# Patient Record
Sex: Female | Born: 2000 | Race: White | Hispanic: No | Smoking: Never smoker
Health system: Southern US, Community
[De-identification: ages and names within clinical notes are randomized; demographics above are authoritative.]

## PROBLEM LIST (undated history)

## (undated) DIAGNOSIS — R519 Headache, unspecified: Secondary | ICD-10-CM

## (undated) DIAGNOSIS — G5700 Lesion of sciatic nerve, unspecified lower limb: Secondary | ICD-10-CM

## (undated) DIAGNOSIS — G4730 Sleep apnea, unspecified: Secondary | ICD-10-CM

## (undated) DIAGNOSIS — J45909 Unspecified asthma, uncomplicated: Secondary | ICD-10-CM

## (undated) DIAGNOSIS — F419 Anxiety disorder, unspecified: Secondary | ICD-10-CM

## (undated) DIAGNOSIS — G90A Postural orthostatic tachycardia syndrome (POTS): Secondary | ICD-10-CM

## (undated) DIAGNOSIS — K219 Gastro-esophageal reflux disease without esophagitis: Secondary | ICD-10-CM

## (undated) DIAGNOSIS — Q6689 Other  specified congenital deformities of feet: Secondary | ICD-10-CM

## (undated) DIAGNOSIS — E079 Disorder of thyroid, unspecified: Secondary | ICD-10-CM

## (undated) DIAGNOSIS — G4700 Insomnia, unspecified: Secondary | ICD-10-CM

## (undated) DIAGNOSIS — E282 Polycystic ovarian syndrome: Secondary | ICD-10-CM

## (undated) DIAGNOSIS — F4310 Post-traumatic stress disorder, unspecified: Secondary | ICD-10-CM

## (undated) DIAGNOSIS — I10 Essential (primary) hypertension: Secondary | ICD-10-CM

## (undated) DIAGNOSIS — N946 Dysmenorrhea, unspecified: Secondary | ICD-10-CM

## (undated) DIAGNOSIS — N643 Galactorrhea not associated with childbirth: Secondary | ICD-10-CM

## (undated) DIAGNOSIS — G478 Other sleep disorders: Secondary | ICD-10-CM

## (undated) DIAGNOSIS — K449 Diaphragmatic hernia without obstruction or gangrene: Secondary | ICD-10-CM

## (undated) DIAGNOSIS — L309 Dermatitis, unspecified: Secondary | ICD-10-CM

## (undated) DIAGNOSIS — F909 Attention-deficit hyperactivity disorder, unspecified type: Secondary | ICD-10-CM

## (undated) DIAGNOSIS — F329 Major depressive disorder, single episode, unspecified: Secondary | ICD-10-CM

## (undated) HISTORY — DX: Disorder of thyroid, unspecified: E07.9

## (undated) HISTORY — DX: Galactorrhea not associated with childbirth: N64.3

## (undated) HISTORY — DX: Diaphragmatic hernia without obstruction or gangrene: K44.9

## (undated) HISTORY — DX: Other specified congenital deformities of feet: Q66.89

## (undated) HISTORY — DX: Lesion of sciatic nerve, unspecified lower limb: G57.00

## (undated) HISTORY — DX: Other sleep disorders: G47.8

## (undated) HISTORY — PX: CARDIAC SURGERY: SHX584

## (undated) HISTORY — DX: Dysmenorrhea, unspecified: N94.6

## (undated) HISTORY — DX: Dermatitis, unspecified: L30.9

## (undated) HISTORY — DX: Gastro-esophageal reflux disease without esophagitis: K21.9

## (undated) HISTORY — DX: Sleep apnea, unspecified: G47.30

## (undated) HISTORY — DX: Unspecified asthma, uncomplicated: J45.909

## (undated) HISTORY — DX: Essential (primary) hypertension: I10

## (undated) HISTORY — DX: Postural orthostatic tachycardia syndrome (POTS): G90.A

## (undated) HISTORY — DX: Polycystic ovarian syndrome: E28.2

## (undated) HISTORY — PX: TONSILLECTOMY: SUR1361

## (undated) HISTORY — PX: ADENOIDECTOMY: SUR15

## (undated) HISTORY — DX: Insomnia, unspecified: G47.00

## (undated) HISTORY — DX: Headache, unspecified: R51.9

---
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
## 2022-01-16 ENCOUNTER — Other Ambulatory Visit: Payer: Self-pay | Admitting: Family Medicine

## 2022-01-16 ENCOUNTER — Encounter: Payer: Self-pay | Admitting: Allergy & Immunology

## 2022-01-20 ENCOUNTER — Other Ambulatory Visit: Payer: Self-pay | Admitting: *Deleted

## 2022-01-20 MED ORDER — PREDNISONE 10 MG PO TABS: ORAL_TABLET | ORAL | 0 refills | Status: DC

## 2022-01-20 MED ORDER — BUDESONIDE 0.5 MG/2ML IN SUSP: 0.5000 mg | Freq: Two times a day (BID) | RESPIRATORY_TRACT | 1 refills | Status: DC | PRN

## 2022-01-24 ENCOUNTER — Encounter: Payer: Self-pay | Admitting: Allergy & Immunology

## 2022-01-25 ENCOUNTER — Encounter: Payer: Self-pay | Admitting: Allergy & Immunology

## 2022-01-25 NOTE — Telephone Encounter (Signed)
We can order it tomorrow and have you sign so that we can mail it to her home.

## 2022-01-26 MED ORDER — PNEUMOVAX 23 25 MCG/0.5ML IJ INJ: 0.5000 mL | INJECTION | INTRAMUSCULAR | 0 refills | Status: AC

## 2022-01-26 NOTE — Telephone Encounter (Signed)
I just wrote it!  Malachi Bonds, MD [facility]

## 2022-02-01 ENCOUNTER — Other Ambulatory Visit: Payer: Self-pay

## 2022-02-01 ENCOUNTER — Ambulatory Visit (INDEPENDENT_AMBULATORY_CARE_PROVIDER_SITE_OTHER): Payer: Medicaid Other | Admitting: Allergy & Immunology

## 2022-02-01 VITALS — BP 120/78 | HR 101 | Temp 97.3°F | Ht 59.0 in | Wt 232.4 lb

## 2022-02-01 DIAGNOSIS — J4550 Severe persistent asthma, uncomplicated: Secondary | ICD-10-CM

## 2022-02-01 DIAGNOSIS — J3089 Other allergic rhinitis: Secondary | ICD-10-CM | POA: Diagnosis not present

## 2022-02-01 DIAGNOSIS — J302 Other seasonal allergic rhinitis: Secondary | ICD-10-CM

## 2022-02-01 DIAGNOSIS — B999 Unspecified infectious disease: Secondary | ICD-10-CM

## 2022-02-01 DIAGNOSIS — T7800XD Anaphylactic reaction due to unspecified food, subsequent encounter: Secondary | ICD-10-CM

## 2022-02-01 DIAGNOSIS — L2084 Intrinsic (allergic) eczema: Secondary | ICD-10-CM

## 2022-02-01 NOTE — Progress Notes (Signed)
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 [name]  Plan/Recommendations:    1. Intrinsic atopic dermatitis  - Continue with the triamcinolone ointment as needed.  - Continue with moisturizing twice daily.   2. Moderate persistent asthma, uncomplicated - Lung testing looked AMAZING today! GREAT WORK!  - Daily controller medication(s): Symbicort 160/4.5 two puffs twice daily with spacer + Singulair (montelukast) 10mg  daily.  - Rescue medications: ProAir 4 puffs every 4-6 hours as needed - Changes during respiratory infections or worsening symptoms: add Flovent to 2 puffs twice daily for TWO WEEKS. - Asthma control goals:  * Full participation in all desired activities (may need albuterol before activity) * Albuterol use two time or less a week on average (not counting use with activity) * Cough interfering with sleep two time or less a month * Oral steroids no more than once a year * No hospitalizations  3. Chronic allergic rhinitis (indoor molds, outdoor molds, dust mites and cat) - Your nose looks great today!  - Continue with: Singulair (montelukast) 10mg  daily and Dymista (fluticasone/azelastine) two sprays per nostril 1-2 times daily as needed  - Consider nasal saline rinses 1-2 times daily to remove allergens from the nasal cavities as well as help with mucous clearance (this is especially helpful to do before the nasal sprays are given)  4. 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   5. Return in about 6 months (around 08/04/2022).    Subjective:   Molly [name] is a 21 y.o. 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   Insomnia 06/10/2020   Sleep paralysis 06/10/2020   Dysmenorrhea 03/23/2020   Menorrhagia with irregular cycle 03/23/2020   Fatigue 03/23/2020   ADHD (attention deficit hyperactivity disorder) 03/09/2020   Unexplained dysphagia 01/21/2020   Anxiety 10/23/2019   Eustachian tube dysfunction, left 10/23/2019   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 04/01/2019   Obesity without serious comorbidity with body mass index (BMI) in 95th to 98th percentile for age in pediatric patient 09/04/2018   MDD (major depressive disorder), recurrent, severe, 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   Suicidal ideation 03/07/2018   PTSD (post-traumatic stress disorder) 03/07/2018   MDD (major depressive disorder), severe (HCC) 03/06/2018   Keratosis pilaris 01/15/2018   Severe persistent asthma, uncomplicated 01/15/2018   Intrinsic atopic dermatitis 01/15/2018   Seasonal and perennial allergic rhinitis 01/15/2018   Anaphylactic shock due to adverse food reaction 01/15/2018   Tic disorder 2019  Scoliosis 2019   Adverse food reaction 07/11/2016   Chronic headaches 05/07/2012   Chronic migraine w/o aura w/o status migrainosus, not intractable 05/07/2012   Hyperlipidemia 2013   History of atrial septal defect repair 12/04/2011   Idiopathic urticaria 09/05/2011   Chronic  constipation 09/05/2011   Hypertension 2011    History obtained from: chart review and patient and mother.  Molly [name] is a 21 y.o. female presenting for a follow up visit. She was last seen in October 2022.  At that time, her asthma was controlled with Symbicort 160 mcg 2 puffs twice daily as well as Singulair and albuterol.  She has Flovent that she has during respiratory flares.  For her rhinitis, we continue with Allegra as well as Singulair and Dymista.  She continues to avoid fish as well as blueberries.  Atopic dermatitis was controlled with triamcinolone and moisturizing.  We did decide to do an immune work-up again.  She was having a lot of breakthrough infections.  Her IgG continued to decline at 467.  However, it has been down to 436 as recently as 2018. She is still working on getting her Pneumovax.   Since the last visit, she has largely done well.    Asthma/Respiratory Symptom History: She reports that she feels that "something is weighing down" on her chest and her ribs were hurting. She started Flovent and she had been using the albuterol every 4 hours. Mom knows that sometimes her anxiety plays a part in this and she thinks that this might have something to do with it.  She has not had any other illnesses aside from the COVID in November and then the illness at the end of last month. She is still waiting to get the Pneumovax. [facility]   Allergic Rhinitis Symptom History: She has had some sneezing.  She remains on the Allegra daily as well as the montelukast. She has not needed antibiotics in quite some time. Overall symptoms are under excellent control.   Food Allergy Symptom History: She continues to avoid fish and blueberries. She has not been interested in food challenges in the past. Her EpiPen is up to date.   She graduates in June 2023. 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 It seems that she has everything planned out.   Otherwise, there have been no changes to her past medical history, surgical history, family history, or social history.    Review of Systems  Constitutional: Negative.  Negative for chills, fever, malaise/fatigue and weight loss.  HENT:  Positive for congestion. Negative for ear discharge, ear pain and sinus pain.   Eyes:  Negative for pain, discharge and redness.  Respiratory:  Negative for cough, sputum production, shortness of breath and wheezing.   Cardiovascular: Negative.  Negative for chest pain and palpitations.  Gastrointestinal:  Negative for abdominal pain, constipation, diarrhea, heartburn, nausea and vomiting.  Skin: Negative.  Negative for itching and rash.  Neurological:  Negative for dizziness and headaches.  Endo/Heme/Allergies:  Positive for environmental allergies. Does not bruise/bleed easily.      Objective:   Blood pressure 120/78, pulse (!) 101, temperature (!) 97.3 F (36.3 C), height 4\' 11"  (1.499 m), weight 232 lb 6.4 oz (105.4 kg), SpO2 99 %. Body mass index is 46.94 kg/m.    Physical Exam Vitals reviewed.  Constitutional:  Appearance: She is well-developed.     Comments: Overall, she looks very good today. She is smiling and interactive.   HENT:     Head: Normocephalic and atraumatic.     Right Ear: Tympanic membrane, ear canal and external ear normal.     Left Ear: Tympanic membrane, ear canal and external ear normal.     Nose: Mucosal edema and rhinorrhea present. No nasal deformity or septal deviation.     Right Turbinates: Enlarged, swollen and pale.     Left Turbinates: Enlarged, swollen and pale.     Right Sinus: No maxillary sinus tenderness or frontal sinus tenderness.     Left Sinus: No maxillary sinus tenderness or frontal sinus tenderness.     Mouth/Throat:     Mouth: Mucous membranes are not pale and not dry.     Pharynx: Uvula midline.  Eyes:      General: Lids are normal. No allergic shiner.       Right eye: No discharge.        Left eye: No discharge.     Conjunctiva/sclera: Conjunctivae normal.     Right eye: Right conjunctiva is not injected. No chemosis.    Left eye: Left conjunctiva is not injected. No chemosis.    Pupils: Pupils are equal, round, and reactive to light.  Cardiovascular:     Rate and Rhythm: Normal rate and regular rhythm.     Heart sounds: Normal heart sounds.  Pulmonary:     Effort: Pulmonary effort is normal. No tachypnea, accessory muscle usage or respiratory distress.     Breath sounds: Normal breath sounds. No wheezing, rhonchi or rales.     Comments: Moving air well in all lung fields.  No increased work of breathing. Chest:     Chest wall: No tenderness.  Lymphadenopathy:     Cervical: No cervical adenopathy.  Skin:    General: Skin is warm.     Capillary Refill: Capillary refill takes less than 2 seconds.     Coloration: Skin is not pale.     Findings: No abrasion, erythema, petechiae or rash. Rash is not papular, urticarial or vesicular.     Comments: No eczematous or urticarial lesions noted.   Neurological:     Mental Status: She is alert.  Psychiatric:        Behavior: Behavior is cooperative.     Diagnostic studies:    Spirometry: results normal (FEV1: 2.54/91%, FVC: 3.07/98%, FEV1/FVC: 83%).    Spirometry consistent with normal pattern.   Allergy Studies: none         Malachi Bonds, MD  [facility]

## 2022-02-01 NOTE — Patient Instructions (Addendum)
1. Intrinsic atopic dermatitis  ?- Continue with the triamcinolone ointment as needed.  ?- Continue with moisturizing twice daily.  ? ?2. Moderate persistent asthma, uncomplicated ?- Lung testing looked AMAZING today! GREAT WORK!  ?- Daily controller medication(s): Symbicort 160/4.5 two puffs twice daily with spacer + Singulair (montelukast) 10mg  daily.  ?- Rescue medications: ProAir 4 puffs every 4-6 hours as needed ?- Changes during respiratory infections or worsening symptoms: add Flovent to 2 puffs twice daily for TWO WEEKS. ?- Asthma control goals:  ?* Full participation in all desired activities (may need albuterol before activity) ?* Albuterol use two time or less a week on average (not counting use with activity) ?* Cough interfering with sleep two time or less a month ?* Oral steroids no more than once a year ?* No hospitalizations ? ?3. Chronic allergic rhinitis (indoor molds, outdoor molds, dust mites and cat) ?- Your nose looks great today!  ?- Continue with: Singulair (montelukast) 10mg  daily and Dymista (fluticasone/azelastine) two sprays per nostril 1-2 times daily as needed  ?- Consider nasal saline rinses 1-2 times daily to remove allergens from the nasal cavities as well as help with mucous clearance (this is especially helpful to do before the nasal sprays are given) ? ?4. 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  ? ?5. Return in about 6 months (around 08/04/2022).  ? ? ? ?Please inform of any Emergency Department visits, hospitalizations, or changes in symptoms. Call 10/04/2022 before going to the ED for breathing or allergy symptoms since we might be able to fit you in for a sick visit. Feel free to contact us anytime with any questions, problems, or concerns. ? ?It was a pleasure to see you again today! ? ?Websites that have reliable patient information: ?1. American Academy of Asthma, Allergy, and Immunology: www.aaaai.org ?2. Food Allergy Research and Education  (FARE): foodallergy.org ?3. Mothers of Asthmatics: http://www.asthmacommunitynetwork.org ?4. [place] of Allergy, Asthma, and Immunology: [place] ? ? ?COVID-19 Vaccine Information can be found at: [employer] For questions related to vaccine distribution or appointments, please email [email or web address] or call [phone].  ? ?We realize that you might be concerned about having an allergic reaction to the COVID19 vaccines. To help with that concern, WE ARE OFFERING THE COVID19 VACCINES IN OUR OFFICE! Ask the front desk for dates!  ? ? ? ??Like? [email or web address] on Facebook and Instagram for our latest updates!  ?  ? ? ?A healthy democracy works best when [phone] participate! Make sure you are registered to vote! If you have moved or changed any of your contact information, you will need to get this updated before voting! ? ?In some cases, you MAY be able to register to vote online: [place] ? ? ? ? ?

## 2022-02-03 MED ORDER — VENTOLIN HFA 108 (90 BASE) MCG/ACT IN AERS: 2.0000 | INHALATION_SPRAY | RESPIRATORY_TRACT | 1 refills | Status: DC | PRN

## 2022-02-03 MED ORDER — EPINEPHRINE 0.3 MG/0.3ML IJ SOAJ: 0.3000 mg | INTRAMUSCULAR | 2 refills | Status: DC | PRN

## 2022-02-03 MED ORDER — AZELASTINE-FLUTICASONE 137-50 MCG/ACT NA SUSP: 1.0000 | Freq: Two times a day (BID) | NASAL | 2 refills | Status: DC

## 2022-02-03 MED ORDER — FLUTICASONE PROPIONATE HFA 220 MCG/ACT IN AERO: INHALATION_SPRAY | RESPIRATORY_TRACT | 0 refills | Status: DC

## 2022-02-03 MED ORDER — MONTELUKAST SODIUM 10 MG PO TABS: 10.0000 mg | ORAL_TABLET | Freq: Every day | ORAL | 5 refills | Status: DC

## 2022-02-03 MED ORDER — BUDESONIDE-FORMOTEROL FUMARATE 160-4.5 MCG/ACT IN AERO: INHALATION_SPRAY | RESPIRATORY_TRACT | 5 refills | Status: DC

## 2022-02-05 ENCOUNTER — Encounter: Payer: Self-pay | Admitting: Allergy & Immunology

## 2022-02-06 ENCOUNTER — Other Ambulatory Visit: Payer: Self-pay

## 2022-02-06 ENCOUNTER — Encounter: Payer: Self-pay | Admitting: Pediatrics

## 2022-02-06 ENCOUNTER — Ambulatory Visit (INDEPENDENT_AMBULATORY_CARE_PROVIDER_SITE_OTHER): Payer: Medicaid Other | Admitting: Pediatrics

## 2022-02-06 VITALS — BP 124/86 | HR 128 | Ht 59.21 in | Wt 229.5 lb

## 2022-02-06 DIAGNOSIS — Z1389 Encounter for screening for other disorder: Secondary | ICD-10-CM | POA: Diagnosis not present

## 2022-02-06 DIAGNOSIS — Z1331 Encounter for screening for depression: Secondary | ICD-10-CM | POA: Diagnosis not present

## 2022-02-06 DIAGNOSIS — E559 Vitamin D deficiency, unspecified: Secondary | ICD-10-CM

## 2022-02-06 DIAGNOSIS — Z1330 Encounter for screening examination for mental health and behavioral disorders, unspecified: Secondary | ICD-10-CM | POA: Diagnosis not present

## 2022-02-06 DIAGNOSIS — E669 Obesity, unspecified: Secondary | ICD-10-CM | POA: Diagnosis not present

## 2022-02-06 DIAGNOSIS — G90A Postural orthostatic tachycardia syndrome (POTS): Secondary | ICD-10-CM

## 2022-02-06 DIAGNOSIS — Z0000 Encounter for general adult medical examination without abnormal findings: Secondary | ICD-10-CM | POA: Diagnosis not present

## 2022-02-06 DIAGNOSIS — F419 Anxiety disorder, unspecified: Secondary | ICD-10-CM | POA: Diagnosis not present

## 2022-02-06 DIAGNOSIS — Z68.41 Body mass index (BMI) pediatric, greater than or equal to 95th percentile for age: Secondary | ICD-10-CM

## 2022-02-06 NOTE — Progress Notes (Signed)
Patient Name:  Molly GobbleKatherine M [name] Date of Birth:  04-Jul-2001 Age:  21 y.o. Date of Visit:  02/06/2022   Chief Complaint  Patient presents with   Well Child  Molly AddisonKatie is the main historian, however her mom Molly BumpsJessica also contributed to the history.   SUBJECTIVE:     Interval Histories:  [facility] then [facility].  Her first appt is this month.   Cardio - stopped most meds except metoprolol.  She has now been discharged from Cardio without need for Adult Cardio unless her new adult PCP is not comfortable with her care.      CONCERNS:   1. She had an episode of chest tightness for which she took some albuterol, however it seemed to have caused more chest tightness along with palpitations.      2. She wakes up 1-2 times a week by a migraine and sometimes she wakes up in the morning still with a migraine despite taking Imitrex.  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 However it makes her eyebrows, feet, nose, lips and hands numb and tingly.    3. Water makes her stomach cramp, below and above the belly button.   When she drinks soda, she cramps along her ribs/diaphragm.    4. She wants to know if she may have POTS.  When she stands up from a lying down position, she gets lightheaded and dizzy with palpitations. She also gets very hot and sweaty and breathes heavy with little movement.   5. When her blood sugars go to 80, she gets very dizzy and sweaty.    DEVELOPMENT:    Grade Level in School:  12 th    School Performance:  70-80s    Aspirations:  NICU nurse and trauma nurse    She does chores around the house.    WORK: none     DRIVING:  She took Driver's Ed.    MENTAL HEALTH:     She gets along with siblings for the most part.       SLEEP:  She sometimes can't fall asleep and sometimes wakes up in the middle of the night.  Her mind stays awake, but she denies  worrying.   PHQ-Adolescent 10/21/2019 07/31/2020 02/06/2022  Down, depressed, hopeless 0 0 0  Decreased interest 0 0 0  Altered sleeping 1 - 1  Change in appetite 0 - 1  Tired, decreased energy 1 - 1  Feeling bad or failure about yourself 0 - 0  Trouble concentrating 1 - 0  Moving slowly or fidgety/restless 0 - 0  Suicidal thoughts 0 - 0  PHQ-Adolescent Score 3 0 3  In the past year have you felt depressed or sad most days, even if you felt okay sometimes? Yes - No  If you are experiencing any of the problems on this form, how difficult have these problems made it for you to do your work, take care of things at home or get along with other people? Not difficult at all - Not difficult at all  Has there been a time in the past month when you have had serious thoughts about ending your own life? No - No  Have you ever, in your whole life, tried to kill yourself or made a suicide attempt? No - No  Some encounter information is confidential and restricted. Go to Review Flowsheets activity to see all data.  Minimal Depression <5. Mild Depression 5-9. Moderate Depression 10-14. Moderately Severe Depression 15-19. Severe >20  NUTRITION:    Examples of her meals:  Sausage biscuit. Sausage cheese bagel.  Chicken biscuit.  She has done much better with eating better portion control and controlling snack foods.   She eats 1-2 meals daily  One-A-Day Women's MVI Zinc, Calcium Vit D, Iron supplements  ELIMINATION:  Voids multiple times a day                           Regular stools as long as she gets 3 capfuls of Miralax   EXERCISE:  She plans to go walking.    SAFETY:  She wears seat belt all the time. She feels safe at home.  She feels safe at school.   MENSTRUAL HISTORY:      Cycle:  none due to suppression therapy     Social History   Tobacco Use   Smoking status: Never   Smokeless tobacco: Never  Vaping Use   Vaping Use: Never used  Substance Use Topics   Alcohol use: No     Alcohol/week: 0.0 standard drinks   Drug use: No    Vaping/E-Liquid Use   Vaping Use Never User    Social History   Substance and Sexual Activity  Sexual Activity Never     Past Histories: Past Medical History:  Diagnosis Date   Acid reflux 2010   Acne vulgaris 2017   ADHD (attention deficit hyperactivity disorder) 2011   Adverse food reaction 07/11/2016   Shellfish allergy   Amenorrhea 2019   Anaphylactic shock due to adverse food reaction 01/15/2018   Anxiety 2017   Chronic constipation 09/05/2011   Chronic tension headaches 2011   originally followed by [facility] K.Griffin. Then Dr Alexis Goodell [facility] 03/2020 (see above)    Coalition, calcaneus navicular    Eczema    Eustachian tube dysfunction, left 10/23/2019   Galactorrhea    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 04/01/2019   Hiatal hernia    History of atrial septal defect repair 12/04/2011   Hyperlipidemia 2013   Hypertension 2011   Hypertension 09/28/2020   hypertension, cellulitus    Idiopathic urticaria 09/05/2011   Keratosis pilaris 01/15/2018   Mass of left thigh 11/22/2017   Overview:  Added automatically from request for surgery [id number]   MDD (major depressive disorder), severe (HCC) 03/06/2018   Migraine 2011   re-diagnosed as chronic migraines 03/2020 [facility] - starting on preventative    Obesity without serious comorbidity with body mass index (BMI) in 95th to 98th percentile for age in pediatric patient 09/04/2018   PTSD (post-traumatic stress disorder)    Scoliosis 2019   Seasonal and perennial allergic rhinitis 2010   Severe persistent asthma without complication 2010   Suicidal ideation 03/07/2018   Tic disorder 2019    Family History  Problem Relation Age of Onset   Allergic rhinitis Mother    Asthma Mother    Bipolar disorder Mother    Anxiety disorder Mother    Allergic rhinitis Brother    Asthma Brother    ADD / ADHD Brother    Bipolar disorder Father    Drug abuse Father    Alcohol abuse  Father    ADD / ADHD Father    Bipolar disorder Maternal Grandfather    Angioedema Neg Hx    Eczema Neg Hx    Immunodeficiency Neg Hx    Urticaria  Neg Hx     Allergies  Allergen Reactions   Glucosamine Forte [Nutritional Supplements] Anaphylaxis    Due to containing shellfish   Shellfish-Derived Products Anaphylaxis    Throat swelling   Lactose    Lactose Intolerance (Gi)    Topiramate Other (See Comments)    Numbness to face, hands and feet   Bromfed Dm [Pseudoeph-Bromphen-Dm] Anxiety    CARBOFED DM   Outpatient Medications Prior to Visit  Medication Sig Dispense Refill   albuterol (PROVENTIL) (2.5 MG/3ML) 0.083% nebulizer solution Take 3 mLs (2.5 mg total) by nebulization every 6 (six) hours as needed for wheezing or shortness of breath. 75 mL 0   ascorbic acid (VITAMIN C) 500 MG tablet Take 500 mg by mouth daily.     atomoxetine (STRATTERA) 40 MG capsule Take 1 capsule (40 mg total) by mouth daily. 30 capsule 2   Azelastine-Fluticasone 137-50 MCG/ACT SUSP Place 1 spray into both nostrils 2 (two) times daily. 23 g 2   budesonide (PULMICORT) 0.5 MG/2ML nebulizer solution Take 2 mLs (0.5 mg total) by nebulization 2 (two) times daily as needed. 120 mL 1   budesonide-formoterol (SYMBICORT) 160-4.5 MCG/ACT inhaler INHALE 2 PUFFS TWICE DAILY TO PREVENT COUGHING OR WHEEZING. 10.2 each 5   busPIRone (BUSPAR) 10 MG tablet Take 1 tablet (10 mg total) by mouth 3 (three) times daily. 90 tablet 2   calcium carbonate (OS-CAL) 1250 (500 Ca) MG chewable tablet Chew by mouth.     cariprazine (VRAYLAR) 1.5 MG capsule Take 1 capsule (1.5 mg total) by mouth daily. 30 capsule 2   clobetasol (TEMOVATE) 0.05 % external solution APPLY TO AFFECTED AREA TWICE A DAY 50 mL 3   clonazePAM (KLONOPIN) 0.5 MG tablet Take 1 tablet (0.5 mg total) by mouth 2 (two) times daily. 60 tablet 2   EPINEPHrine 0.3 mg/0.3 mL IJ SOAJ injection Inject 0.3 mg into the muscle as needed for anaphylaxis. 2 each 2   fexofenadine  (ALLEGRA) 180 MG tablet TAKE 1 TABLET BY MOUTH EVERY DAY 30 tablet 5   fluticasone (FLOVENT HFA) 220 MCG/ACT inhaler DURING ASTHMA FLARES INHALE 2 PUFFS TWICE DAILY FOR TWO WEEKS THEN STOP. 12 each 0   hydrocortisone 2.5 % ointment      hyoscyamine (LEVSIN SL) 0.125 MG SL tablet Take by mouth.     ibuprofen (ADVIL) 800 MG tablet Take 800 mg by mouth as needed.     metoprolol succinate (TOPROL-XL) 50 MG 24 hr tablet Take 1 tablet by mouth daily.     montelukast (SINGULAIR) 10 MG tablet Take 1 tablet (10 mg total) by mouth daily. 30 tablet 5   Multiple Vitamin (MULTIVITAMIN) capsule Take by mouth.     norethindrone (AYGESTIN) 5 MG tablet Take 5 mg by mouth daily.     nystatin (MYCOSTATIN/NYSTOP) powder Apply 1 application topically 3 (three) times daily. 60 g 3   Olopatadine HCl 0.2 % SOLN Apply 1 drop to eye every morning.     ondansetron (ZOFRAN-ODT) 4 MG disintegrating tablet DISSOLVE ON TONGUE AT ONSET OF NAUSEA- 30 DAY SUPPLY 10 tablet 1   Polyethylene Glycol 3350 (PEG 3350) POWD 1 capfull (17g) mixed with 8 oz of clear liquid PO daily.  Adjust dose as needed to achieve soft stool daily.     PROAIR HFA 108 (90 Base) MCG/ACT inhaler Inhale 2 puffs into the lungs every 4 (four) hours as needed for wheezing or shortness of breath. 18 g 1   PROVENTIL HFA 108 (90 Base) MCG/ACT  inhaler Inhale 2 puffs into the lungs every 6 (six) hours as needed for wheezing or shortness of breath. 18 g 1   sertraline (ZOLOFT) 50 MG tablet Take 1.5 tablets (75 mg total) by mouth daily. 45 tablet 2   Spacer/Aero-Holding Chambers (OPTICHAMBER DIAMOND) MISC USE AS DIRECTED WITH INHALER 1 each 1   SUMAtriptan (IMITREX) 50 MG tablet 1 tablet at onset of migraine. Repeat in 2 hours of needed. Limit to 2 doses/day, 2 times per week.     VENTOLIN HFA 108 (90 Base) MCG/ACT inhaler Inhale 2 puffs into the lungs every 4 (four) hours as needed for wheezing or shortness of breath. 18 g 1   zinc gluconate 50 MG tablet Take 50 mg by  mouth daily.     acetaZOLAMIDE (DIAMOX) 250 MG tablet Take by mouth. (Patient not taking: Reported on 02/06/2022)     ciprofloxacin-dexamethasone (CIPRODEX) OTIC suspension Place 2 drops into both ears 2 (two) times daily. (Patient not taking: Reported on 02/06/2022) 7.5 mL 0   ferrous sulfate 325 (65 FE) MG tablet Take 1 tablet by mouth daily with breakfast.     FLOVENT HFA 220 MCG/ACT inhaler INHALE 2 PUFFS INTO THE LUNGS 2 (TWO) TIMES DAILY ADD USE DURING ASTHMA FLARES 12 each 0   lidocaine (XYLOCAINE) 2 % solution  (Patient not taking: Reported on 02/06/2022)     omeprazole (PRILOSEC) 40 MG capsule Take by mouth.     predniSONE (DELTASONE) 10 MG tablet Take 2 tablets twice daily for 3 days, then 2 tablets for one day, then one tablet on the last day. (Patient not taking: Reported on 02/06/2022) 15 tablet 0   No facility-administered medications prior to visit.       Review of Systems  Constitutional:  Negative for activity change, chills and fever.  HENT:  Negative for congestion, sore throat and voice change.   Eyes:  Negative for photophobia, discharge and redness.  Respiratory:  Negative for cough, choking, chest tightness and shortness of breath.   Cardiovascular:  Negative for palpitations and leg swelling.  Gastrointestinal:  Negative for abdominal pain, diarrhea and vomiting.  Genitourinary:  Negative for decreased urine volume and urgency.  Musculoskeletal:  Negative for joint swelling, myalgias, neck pain and neck stiffness.  Skin:  Negative for rash.  Neurological:  Negative for tremors, weakness and headaches.    OBJECTIVE:  VITALS:  BP 124/86    Pulse (!) 128    Ht 4' 11.21" (1.504 m)    Wt 229 lb 8 oz (104.1 kg)    SpO2 98%    BMI 46.02 kg/m   Body mass index is 46.02 kg/m.   >99 %ile (Z= 2.41) based on CDC (Girls, 2-20 Years) BMI-for-age based on BMI available as of 02/06/2022. Hearing Screening   500Hz  1000Hz  2000Hz  3000Hz  4000Hz  6000Hz  8000Hz   Right ear 20 20 20 20 20 20  20   Left ear 20 20 20 20 20 20 20    Vision Screening   Right eye Left eye Both eyes  Without correction 20/200 20/200 20/200  With correction       Wt Readings from Last 3 Encounters:  02/06/22 229 lb 8 oz (104.1 kg) (99 %, Z= 2.29)*  02/01/22 232 lb 6.4 oz (105.4 kg) (99 %, Z= 2.32)*  12/26/21 236 lb (107 kg) (>99 %, Z= 2.35)*   * Growth percentiles are based on CDC (Girls, 2-20 Years) data.    PHYSICAL EXAM: GEN:  Alert, active, no acute distress HEENT:  Normocephalic.           Pupils 2-4 mm, equally round and reactive to light.           Extraoccular muscles intact.           Tympanic membranes are pearly gray bilaterally.            Turbinates:  normal          Tongue midline. No pharyngeal lesions.   NECK:  Supple. Full range of motion.  No thyromegaly.  No lymphadenopathy.  No carotid bruit. CARDIOVASCULAR:  Normal S1, S2.  No gallops or clicks.  No murmurs.   LUNGS:  Normal shape.  Clear to auscultation.   CHEST:  Breast SMR V, no masses ABDOMEN:  Normoactive polyphonic bowel sounds.  No masses.  No hepatosplenomegaly EXTREMITIES:  No clubbing.  No cyanosis.  No edema. SKIN:  Well perfused.  No rash NEURO:  Normal muscle strength.  CN II-XI intact.  Normal gait cycle.  +2/4 Deep tendon reflexes.   SPINE:  No deformities.  No scoliosis.    ASSESSMENT/PLAN:   Glynnis is a 21 y.o. teen who is growing and developing well.    - Reviewed self-breast exam.       - Discussed how her actual "low" blood sugars are not low, but indeed normal.  It is possible that she is more used to a higher blood sugar and thus gets symptomatic when it is below her baseline.  I am going to therefore check her A1c to see what her 3 month average BS is to test this theory.      - We talked about her typical diet, which is low in fresh fruit and vegetables and is usually some sort of sandwich.  We discussed how protein and good fats can keep her blood sugar more even, instead of the big spikes one  would get with carb intake.  Therefore, I suggest that she eat a half sandwich or use tortilla instead of bread or biscuit.  Also suggested making a scrambled egg and cheese wrap which would consist only of 1 piece of tortilla instead of 2 with a quesadilla.  Suggest eating small protein snacks like cheese instead of snacky foods.  Praised her for weight loss.   IMMUNIZATIONS:  up to date  OTHER PROBLEMS ADDRESSED THIS VISIT: 1. Obesity without serious comorbidity with body mass index (BMI) in 95th to 98th percentile for age in pediatric patient, unspecified obesity type  - Lipid panel - Hemoglobin A1c - Iron - TSH + free T4  2. Anxiety Discussed how albuterol can make her heart rate faster, which in turn could make her anxious, which in turn could make trigger more wheezing.  Deep breathing is a great way to combat this.  Also discussed Xopenex as a potential alternative.  Perhaps mom could see if Dr Dellis Anes is willing to prescribe this for her.   Also discussed how her attitude towards drinking water has made water a trigger for anxiety related to her difficulty losing weight.  There is a strong connection between the brain and her gut.  I think it is this negative attitude towards drinking water that causes her belly pain.  I recommend that she and her mom change her outlook towards water, meaning, mom can't keep pressuring her to drink water and Molly [name] needs to focus on how good iced (lightly flavored) water tastes instead of focusing on how she has to make alterations to make water taste  good.    3. POTS (postural orthostatic tachycardia syndrome) Discussed how her symptoms could be from POTS.  Her poor fluid intake and her weight is a big contributor of this.  Quite possibly her lack of electrolytes as well since she does not eat bananas or potatoes.    Her symptoms after taking Diamox may have something to do with it being a water pill and subsequent electrolyte abnormalities.  I will go  ahead and check her electrolytes.  - Comprehensive metabolic panel  4. Vitamin D deficiency  - VITAMIN D 25 Hydroxy (Vit-D Deficiency, Fractures)     Return if symptoms worsen or fail to improve.

## 2022-02-06 NOTE — Patient Instructions (Signed)
[facility]:  Dr Reuel Boom and Dr Leandrew Koyanagi ? ?

## 2022-02-10 ENCOUNTER — Ambulatory Visit: Payer: Medicaid Other

## 2022-02-16 ENCOUNTER — Other Ambulatory Visit: Payer: Self-pay

## 2022-02-16 ENCOUNTER — Telehealth (INDEPENDENT_AMBULATORY_CARE_PROVIDER_SITE_OTHER): Payer: Medicaid Other | Admitting: Psychiatry

## 2022-02-16 ENCOUNTER — Encounter (HOSPITAL_COMMUNITY): Payer: Self-pay | Admitting: Psychiatry

## 2022-02-16 DIAGNOSIS — F333 Major depressive disorder, recurrent, severe with psychotic symptoms: Secondary | ICD-10-CM

## 2022-02-16 DIAGNOSIS — F902 Attention-deficit hyperactivity disorder, combined type: Secondary | ICD-10-CM | POA: Diagnosis not present

## 2022-02-16 MED ORDER — SERTRALINE HCL 50 MG PO TABS: 75.0000 mg | ORAL_TABLET | Freq: Every day | ORAL | 2 refills | Status: DC

## 2022-02-16 MED ORDER — CLONAZEPAM 0.5 MG PO TABS: 0.5000 mg | ORAL_TABLET | Freq: Two times a day (BID) | ORAL | 2 refills | Status: DC

## 2022-02-16 MED ORDER — BUSPIRONE HCL 10 MG PO TABS: 10.0000 mg | ORAL_TABLET | Freq: Three times a day (TID) | ORAL | 2 refills | Status: DC

## 2022-02-16 MED ORDER — CARIPRAZINE HCL 1.5 MG PO CAPS: 1.5000 mg | ORAL_CAPSULE | Freq: Every day | ORAL | 2 refills | Status: DC

## 2022-02-16 MED ORDER — ATOMOXETINE HCL 40 MG PO CAPS: 40.0000 mg | ORAL_CAPSULE | Freq: Every day | ORAL | 2 refills | Status: DC

## 2022-02-16 NOTE — Progress Notes (Signed)
Virtual Visit via Telephone Note ? ?I connected with Molly [name] on 02/16/22 at  4:00 PM EDT by telephone and verified that I am speaking with the correct person using two identifiers. ? ?Location: ?Patient: home ?Provider: office ?  ?I discussed the limitations, risks, security and privacy concerns of performing an evaluation and management service by telephone and the availability of in person appointments. I also discussed with the patient that there may be a patient responsible charge related to this service. The patient expressed understanding and agreed to proceed. ? ? ?  ?I discussed the assessment and treatment plan with the patient. The patient was provided an opportunity to ask questions and all were answered. The patient agreed with the plan and demonstrated an understanding of the instructions. ?  ?The patient was advised to call back or seek an in-person evaluation if the symptoms worsen or if the condition fails to improve as anticipated. ? ?I provided 15 minutes of non-face-to-face time during this encounter. ? ? ?Molly Ruder, MD ? ?BH MD/PA/NP OP Progress Note ? ?02/16/2022 4:26 PM ?Molly [name]  ?MRN:  [id number] ? ?Chief Complaint:  ?Chief Complaint  ?Patient presents with  ? Depression  ? Anxiety  ? ADD  ? Follow-up  ? ?HPI: This patient is an 21 year old female who lives with her mother, mother's fianc? and a younger brother in [place].  She rarely has contact with her biological father.  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 ?The patient returns for follow-up after about 6 weeks.  She continues to do very well in school.  She is focusing well with the Strattera.  She is still having a lot of problems with migraine headaches.  She is going to be switching to an adult neurologist soon.  The Diamox made her feel strange so she stopped it.  The Imitrex has not really been helping. ? ?She has not had any other new health issues.  She is excited about  graduating in June and she is already gotten herself set up to start [employer] in the fall.  She denies significant depression mood swings anger irritability.  Most of the time she is sleeping fairly well.  Sometimes the clonazepam at night makes her too drowsy and I urged her to cut it in half.  She could also retry melatonin.  She denies any thoughts of self-harm or suicide or significant anxiety. ?Visit Diagnosis:  ?  ICD-10-CM   ?1. Attention deficit hyperactivity disorder (ADHD), combined type  F90.2   ?  ?2. Severe episode of recurrent major depressive disorder, with psychotic features (HCC)  F33.3   ?  ? ? ?Past Psychiatric History: Prior psychiatric hospitalizations, [facility] ? ?Past Medical History:  ?Past Medical History:  ?Diagnosis Date  ? Acid reflux 2010  ? Acne vulgaris 2017  ? ADHD (attention deficit hyperactivity disorder) 2011  ? Adverse food reaction 07/11/2016  ? Shellfish allergy  ? Amenorrhea 2019  ? Anaphylactic shock due to adverse food reaction 01/15/2018  ? Anxiety 2017  ? Chronic constipation 09/05/2011  ? Chronic tension headaches 2011  ? originally followed by [facility] K.Griffin. Then Dr Alexis Goodell [facility] 03/2020 (see above)   ? Coalition, calcaneus navicular   ? Eczema   ? Eustachian tube dysfunction, left 10/23/2019  ? Galactorrhea   ? 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 04/01/2019  ? Hiatal hernia   ? History of atrial septal defect  repair 12/04/2011  ? Hyperlipidemia 2013  ? Hypertension 2011  ? Hypertension 09/28/2020  ? hypertension, cellulitus   ? Idiopathic urticaria 09/05/2011  ? Keratosis pilaris 01/15/2018  ? Mass of left thigh 11/22/2017  ? Overview:  Added automatically from request for surgery [id number]  ? MDD (major depressive disorder), severe (HCC) 03/06/2018  ? Migraine 2011  ? re-diagnosed as chronic migraines 03/2020 [facility] - starting on preventative   ? Obesity without serious comorbidity with body mass index (BMI) in 95th to 98th  percentile for age in pediatric patient 09/04/2018  ? PTSD (post-traumatic stress disorder)   ? Scoliosis 2019  ? Seasonal and perennial allergic rhinitis 2010  ? Severe persistent asthma without complication 2010  ? Suicidal ideation 03/07/2018  ? Tic disorder 2019  ?  ?Past Surgical History:  ?Procedure Laterality Date  ? ASD REPAIR  2012  ? with Helix, via Cardiac Catheterization  ? DENTAL SURGERY  2020  ? lipoma removal  2019  ? LUMBAR PUNCTURE  05/20/2021  ? TONSILLECTOMY AND ADENOIDECTOMY  2010  ? ? ?Family Psychiatric History: see below ? ?Family History:  ?Family History  ?Problem Relation Age of Onset  ? Allergic rhinitis Mother   ? Asthma Mother   ? Bipolar disorder Mother   ? Anxiety disorder Mother   ? Allergic rhinitis Brother   ? Asthma Brother   ? ADD / ADHD Brother   ? Bipolar disorder Father   ? Drug abuse Father   ? Alcohol abuse Father   ? ADD / ADHD Father   ? Bipolar disorder Maternal Grandfather   ? Angioedema Neg Hx   ? Eczema Neg Hx   ? Immunodeficiency Neg Hx   ? Urticaria Neg Hx   ? ? ?Social History:  ?Social History  ? ?Socioeconomic History  ? Marital status: Single  ?  Spouse name: Not on file  ? Number of children: Not on file  ? Years of education: Not on file  ? Highest education level: Not on file  ?Occupational History  ? Not on file  ?Tobacco Use  ? Smoking status: Never  ? Smokeless tobacco: Never  ?Vaping Use  ? Vaping Use: Never used  ?Substance and Sexual Activity  ? Alcohol use: No  ?  Alcohol/week: 0.0 standard drinks  ? Drug use: No  ? Sexual activity: Never  ?Other Topics Concern  ? Not on file  ?Social History Narrative  ? Not on file  ? ?Social Determinants of Health  ? ?Financial Resource Strain: Not on file  ?Food Insecurity: Not on file  ?Transportation Needs: Not on file  ?Physical Activity: Not on file  ?Stress: Not on file  ?Social Connections: Not on file  ? ? ?Allergies:  ?Allergies  ?Allergen Reactions  ? Glucosamine Forte [Nutritional Supplements] Anaphylaxis  ?   Due to containing shellfish  ? Shellfish-Derived Products Anaphylaxis  ?  Throat swelling  ? Lactose   ? Lactose Intolerance (Gi)   ? Topiramate Other (See Comments)  ?  Numbness to face, hands and feet  ? Bromfed Dm [Pseudoeph-Bromphen-Dm] Anxiety  ?  CARBOFED DM  ? ? ?Metabolic Disorder Labs: ?Lab Results  ?Component Value Date  ? HGBA1C 5.6 11/26/2019  ? MPG 105.41 03/07/2018  ? ?Lab Results  ?Component Value Date  ? PROLACTIN 49.7 (H) 03/07/2018  ? ?Lab Results  ?Component Value Date  ? CHOL 177 (H) 11/26/2019  ? TRIG 155 (H) 11/26/2019  ? HDL 45 11/26/2019  ? CHOLHDL  3.9 11/26/2019  ? VLDL 20 03/07/2018  ? LDLCALC 105 11/26/2019  ? LDLCALC 117 (H) 03/07/2018  ? ?Lab Results  ?Component Value Date  ? TSH 2.434 03/07/2018  ? ? ?Therapeutic Level Labs: ?No results found for: LITHIUM ?No results found for: VALPROATE ?No components found for:  CBMZ ? ?Current Medications: ?Current Outpatient Medications  ?Medication Sig Dispense Refill  ? acetaZOLAMIDE (DIAMOX) 250 MG tablet Take by mouth. (Patient not taking: Reported on 02/06/2022)    ? albuterol (PROVENTIL) (2.5 MG/3ML) 0.083% nebulizer solution Take 3 mLs (2.5 mg total) by nebulization every 6 (six) hours as needed for wheezing or shortness of breath. 75 mL 0  ? ascorbic acid (VITAMIN C) 500 MG tablet Take 500 mg by mouth daily.    ? atomoxetine (STRATTERA) 40 MG capsule Take 1 capsule (40 mg total) by mouth daily. 30 capsule 2  ? Azelastine-Fluticasone 137-50 MCG/ACT SUSP Place 1 spray into both nostrils 2 (two) times daily. 23 g 2  ? budesonide (PULMICORT) 0.5 MG/2ML nebulizer solution Take 2 mLs (0.5 mg total) by nebulization 2 (two) times daily as needed. 120 mL 1  ? budesonide-formoterol (SYMBICORT) 160-4.5 MCG/ACT inhaler INHALE 2 PUFFS TWICE DAILY TO PREVENT COUGHING OR WHEEZING. 10.2 each 5  ? busPIRone (BUSPAR) 10 MG tablet Take 1 tablet (10 mg total) by mouth 3 (three) times daily. 90 tablet 2  ? calcium carbonate (OS-CAL) 1250 (500 Ca) MG chewable  tablet Chew by mouth.    ? cariprazine (VRAYLAR) 1.5 MG capsule Take 1 capsule (1.5 mg total) by mouth daily. 30 capsule 2  ? ciprofloxacin-dexamethasone (CIPRODEX) OTIC suspension Place 2 drops into both ear

## 2022-02-23 ENCOUNTER — Encounter: Payer: Self-pay | Admitting: Pediatrics

## 2022-02-23 ENCOUNTER — Ambulatory Visit (INDEPENDENT_AMBULATORY_CARE_PROVIDER_SITE_OTHER): Payer: Medicaid Other | Admitting: Pediatrics

## 2022-02-23 ENCOUNTER — Other Ambulatory Visit: Payer: Self-pay

## 2022-02-23 VITALS — BP 138/95 | HR 106 | Ht 59.06 in | Wt 230.2 lb

## 2022-02-23 DIAGNOSIS — H60312 Diffuse otitis externa, left ear: Secondary | ICD-10-CM

## 2022-02-23 DIAGNOSIS — J069 Acute upper respiratory infection, unspecified: Secondary | ICD-10-CM

## 2022-02-23 LAB — POCT INFLUENZA B: Rapid Influenza B Ag: NEGATIVE

## 2022-02-23 LAB — POC SOFIA SARS ANTIGEN FIA: SARS Coronavirus 2 Ag: NEGATIVE

## 2022-02-23 LAB — POCT INFLUENZA A: Rapid Influenza A Ag: NEGATIVE

## 2022-02-23 MED ORDER — CIPROFLOXACIN-DEXAMETHASONE 0.3-0.1 % OT SUSP: 4.0000 [drp] | Freq: Two times a day (BID) | OTIC | 0 refills | Status: DC

## 2022-02-23 NOTE — Progress Notes (Signed)
? ?Patient Name:  Molly [name] ?Date of Birth:  2001-11-19 ?Age:  21 y.o. ?Date of Visit:  02/23/2022  ? ?Accompanied by:  patient    (primary historian) ?Interpreter:  none ? ?Subjective:  ?  ?Molly [name]  is a 21 y.o. who presents with complaints of ? ?She started having bodyache and chills and nausea 10 days  ago. She was seen in urgent care and was placed on Zithromax. She finished the Abx. She has right ear pain. ?Sometimes she feels like  her faces is flushed but when checks temp has no fever. ? ?Ibuprofen and Tylenol on and off for pain. ?She takes her daily  medications and has been adherent with them. ? ? ?Past Medical History:  ?Diagnosis Date  ? Acid reflux 2010  ? Acne vulgaris 2017  ? ADHD (attention deficit hyperactivity disorder) 2011  ? Adverse food reaction 07/11/2016  ? Shellfish allergy  ? Amenorrhea 2019  ? Anaphylactic shock due to adverse food reaction 01/15/2018  ? Anxiety 2017  ? Chronic constipation 09/05/2011  ? Chronic tension headaches 2011  ? originally followed by [facility] K.Griffin. Then Dr Alexis Goodell [facility] 03/2020 (see above)   ? Coalition, calcaneus navicular   ? Eczema   ? Eustachian tube dysfunction, left 10/23/2019  ? Galactorrhea   ? 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 04/01/2019  ? Hiatal hernia   ? History of atrial septal defect repair 12/04/2011  ? Hyperlipidemia 2013  ? Hypertension 2011  ? Hypertension 09/28/2020  ? hypertension, cellulitus   ? Idiopathic urticaria 09/05/2011  ? Keratosis pilaris 01/15/2018  ? Mass of left thigh 11/22/2017  ? Overview:  Added automatically from request for surgery [id number]  ? MDD (major depressive disorder), severe (HCC) 03/06/2018  ? Migraine 2011  ? re-diagnosed as chronic migraines 03/2020 [facility] - starting on preventative   ? Obesity without serious comorbidity with body mass index (BMI) in 95th to 98th percentile for age in pediatric patient 09/04/2018  ? PTSD (post-traumatic stress disorder)   ? Scoliosis 2019  ? Seasonal and perennial  allergic rhinitis 2010  ? Severe persistent asthma without complication 2010  ? Suicidal ideation 03/07/2018  ? Tic disorder 2019  ?  ? ?Past Surgical History:  ?Procedure Laterality Date  ? ASD REPAIR  2012  ? with Helix, via Cardiac Catheterization  ? DENTAL SURGERY  2020  ? lipoma removal  2019  ? LUMBAR PUNCTURE  05/20/2021  ? TONSILLECTOMY AND ADENOIDECTOMY  2010  ?  ? ?Family History  ?Problem Relation Age of Onset  ? Allergic rhinitis Mother   ? Asthma Mother   ? Bipolar disorder Mother   ? Anxiety disorder Mother   ? Allergic rhinitis Brother   ? Asthma Brother   ? ADD / ADHD Brother   ? Bipolar disorder Father   ? Drug abuse Father   ? Alcohol abuse Father   ? ADD / ADHD Father   ? Bipolar disorder Maternal Grandfather   ? Angioedema Neg Hx   ? Eczema Neg Hx   ? Immunodeficiency Neg Hx   ? Urticaria Neg Hx   ? ? ?No outpatient medications have been marked as taking for the 02/23/22 encounter (Office Visit) with Berna Bue, MD.  ?    ? ?Allergies  ?Allergen Reactions  ? Glucosamine Forte [Nutritional Supplements] Anaphylaxis  ?  Due to containing shellfish  ? Shellfish-Derived Products Anaphylaxis  ?  Throat swelling  ? Lactose   ? Lactose Intolerance (Gi)   ?  Topiramate Other (See Comments)  ?  Numbness to face, hands and feet  ? Bromfed Dm [Pseudoeph-Bromphen-Dm] Anxiety  ?  CARBOFED DM  ? ? ?Review of Systems  ?Constitutional:  Negative for fever.  ?HENT:  Positive for ear pain. Negative for congestion, ear discharge and sinus pain.   ?Respiratory:  Negative for cough.   ?Gastrointestinal:  Negative for abdominal pain, nausea and vomiting.  ?  ?Objective:  ? ?Blood pressure (!) 138/95, pulse (!) 106, height 4' 11.06" (1.5 m), weight 230 lb 4 oz (104.4 kg), SpO2 97 %. ? ?Physical Exam ?Constitutional:   ?   General: She is not in acute distress. ?HENT:  ?   Right Ear: Tympanic membrane normal.  ?   Left Ear: Tympanic membrane normal.  ?   Ears:  ?   Comments: left ear canal: erythema and swelling, still  able to visualize TM and has no drainage. ?   Nose: No congestion or rhinorrhea.  ?   Mouth/Throat:  ?   Pharynx: No posterior oropharyngeal erythema.  ?Eyes:  ?   Conjunctiva/sclera: Conjunctivae normal.  ?Cardiovascular:  ?   Pulses: Normal pulses.  ?Pulmonary:  ?   Effort: Pulmonary effort is normal. No respiratory distress.  ?   Breath sounds: Normal breath sounds. No wheezing.  ?Musculoskeletal:  ?   Cervical back: Neck supple.  ?  ? ?IN-HOUSE Laboratory Results:  ?  ?Results for orders placed or performed in visit on 02/23/22  ?POC SOFIA Antigen FIA  ?Result Value Ref Range  ? SARS Coronavirus 2 Ag Negative Negative  ?POCT Influenza A  ?Result Value Ref Range  ? Rapid Influenza A Ag Negative   ?POCT Influenza B  ?Result Value Ref Range  ? Rapid Influenza B Ag negaive   ? ?  ?Assessment and plan:  ? Patient is here for  ? ?1. Acute diffuse otitis externa of left ear ?- ciprofloxacin-dexamethasone (CIPRODEX) OTIC suspension; Place 4 drops into the left ear 2 (two) times daily. ? ?Treatment with topical antibiotics discussed ?Indications to seek immediate medical care and return to clinic reviewed ?Symptom control and pain management reviewed ?Advised to keep the ear canal dry after taking shower (with a using a towel  and avoid using Q-tip).  ? ?2. Acute URI ?- POC SOFIA Antigen FIA ?- POCT Influenza A ?- POCT Influenza B ? ? ?No follow-ups on file.  ? ?

## 2022-02-28 ENCOUNTER — Encounter: Payer: Self-pay | Admitting: Pediatrics

## 2022-03-07 LAB — COMPREHENSIVE METABOLIC PANEL
ALT: 26 IU/L (ref 0–32)
AST: 17 IU/L (ref 0–40)
Albumin/Globulin Ratio: 2 (ref 1.2–2.2)
Albumin: 4.3 g/dL (ref 3.9–5.0)
Alkaline Phosphatase: 68 IU/L (ref 42–106)
BUN/Creatinine Ratio: 16 (ref 9–23)
BUN: 12 mg/dL (ref 6–20)
Bilirubin Total: 0.3 mg/dL (ref 0.0–1.2)
CO2: 24 mmol/L (ref 20–29)
Calcium: 9.9 mg/dL (ref 8.7–10.2)
Chloride: 105 mmol/L (ref 96–106)
Creatinine, Ser: 0.77 mg/dL (ref 0.57–1.00)
Globulin, Total: 2.2 g/dL (ref 1.5–4.5)
Glucose: 83 mg/dL (ref 70–99)
Potassium: 4.5 mmol/L (ref 3.5–5.2)
Sodium: 143 mmol/L (ref 134–144)
Total Protein: 6.5 g/dL (ref 6.0–8.5)

## 2022-03-07 LAB — TSH+FREE T4
Free T4: 1.1 ng/dL (ref 0.93–1.60)
TSH: 1.55 u[IU]/mL (ref 0.450–4.500)

## 2022-03-07 LAB — IRON: Iron: 104 ug/dL (ref 27–159)

## 2022-03-07 LAB — HEMOGLOBIN A1C
Est. average glucose Bld gHb Est-mCnc: 103 mg/dL
Hgb A1c MFr Bld: 5.2 % (ref 4.8–5.6)

## 2022-03-07 LAB — LIPID PANEL
Chol/HDL Ratio: 4.9 ratio — ABNORMAL HIGH (ref 0.0–4.4)
Cholesterol, Total: 192 mg/dL — ABNORMAL HIGH (ref 100–169)
HDL: 39 mg/dL — ABNORMAL LOW (ref >39)
LDL Chol Calc (NIH): 138 mg/dL — ABNORMAL HIGH (ref 0–109)
Triglycerides: 84 mg/dL (ref 0–89)
VLDL Cholesterol Cal: 15 mg/dL (ref 5–40)

## 2022-03-07 LAB — VITAMIN D 25 HYDROXY (VIT D DEFICIENCY, FRACTURES): Vit D, 25-Hydroxy: 52.2 ng/mL (ref 30.0–100.0)

## 2022-03-08 ENCOUNTER — Encounter: Payer: Self-pay | Admitting: Pediatrics

## 2022-03-08 ENCOUNTER — Telehealth: Payer: Self-pay | Admitting: Pediatrics

## 2022-03-08 NOTE — Telephone Encounter (Signed)
The LDL cholesterol went up a little bit.   ?Thyroid is normal.   ?Cholesterol is mostly from red meats, therefore limit that.   ?I am mailing her the results.  ?

## 2022-03-08 NOTE — Telephone Encounter (Signed)
Tell mom:  ?I don't think fasting would make a difference.  Let Dr Milas Kocher know about these results.  Dr Milas Kocher should be able to see her results since it's all in Epic. ?If I were to guess what he would do next, it would be to repeat it one more time. ?

## 2022-03-08 NOTE — Telephone Encounter (Signed)
Per mom when Doctor Milas Kocher did some blood work her, her thyroid was abnormal but she was not fasting. When you sent her for blood work she was fasting. Mom wants to know if that makes a difference. ?

## 2022-03-08 NOTE — Telephone Encounter (Signed)
MOM is calling for test (lab)results. Mom said her Thyroid is abnormal according to childs migraine Dr.   ?

## 2022-03-09 NOTE — Telephone Encounter (Signed)
Spoke with mom about information and she states she will let doctor Donneta Romberg know about the results we received.

## 2022-03-13 ENCOUNTER — Encounter: Payer: Self-pay | Admitting: Allergy & Immunology

## 2022-04-04 ENCOUNTER — Ambulatory Visit (INDEPENDENT_AMBULATORY_CARE_PROVIDER_SITE_OTHER): Payer: Medicaid Other | Admitting: Family Medicine

## 2022-04-04 ENCOUNTER — Encounter: Payer: Self-pay | Admitting: Family Medicine

## 2022-04-04 VITALS — BP 110/78 | HR 105 | Temp 97.8°F | Ht 59.0 in | Wt 233.4 lb

## 2022-04-04 DIAGNOSIS — Z7689 Persons encountering health services in other specified circumstances: Secondary | ICD-10-CM

## 2022-04-04 DIAGNOSIS — R000 Tachycardia, unspecified: Secondary | ICD-10-CM

## 2022-04-04 DIAGNOSIS — Z862 Personal history of diseases of the blood and blood-forming organs and certain disorders involving the immune mechanism: Secondary | ICD-10-CM

## 2022-04-04 DIAGNOSIS — E538 Deficiency of other specified B group vitamins: Secondary | ICD-10-CM

## 2022-04-04 NOTE — Progress Notes (Signed)
? ?NEW PATIENT TO ESTABLISH CARE ? ?Assessment & Plan:  ?1. Tachycardia ?Patient to keep upcoming appointment with cardiology. ?- Anemia Profile B ?- Vitamin B12 ? ?2. History of anemia ?Labs to assess for the need for an iron supplement. ?- Anemia Profile B ? ?3. B12 deficiency ?- Anemia Profile B ? ?4. Encounter to establish care ? ? ?Return in about 3 months (around 07/05/2022) for DM. ? ?Hendricks Limes, MSN, APRN, FNP-C ?[place] ? ?Subjective:  ? ? Patient ID: Molly [name], female    DOB: 2001-10-09, 21 y.o.   MRN: [id number] ? ?Patient Care Team: ?Loman Brooklyn, FNP as PCP - General (Family Medicine) ?Valentina Shaggy, MD as Consulting Physician (Allergy and Immunology) ?Cloria Spring, MD as Consulting Physician [facility])  ? ?Chief Complaint:  ?Chief Complaint  ?Patient presents with  ? New Patient (Initial Visit)  ?  [facility]  ? Labs Only  ?  Neuro- Dr. Vernon Prey stopped patients iron and told follow up PCP. When patient went to   ? thyroid check   ? ? ?HPI: ?Molly [name] is a 21 y.o. female presenting on 04/04/2022 for New Patient (Initial Visit) ([facility]), Labs Only (Neuro- Dr. Vernon Prey stopped patients iron and told follow up PCP. When patient went to ), and thyroid check  ? ?Patient is accompanied by her mother.  She is here to establish care. ? ?Mom wants to make sure it is okay for patient to be off of her iron supplement as directed by their neurologist.  Patient has been anemic in the past.  She does currently have symptoms related to her pots, that could also be symptoms of anemia (tachycardia, fatigue, feeling lightheaded, shakiness). ? ?Mom also reports she was started on a B12 supplement, and wants to make sure this is okay. ? ?Patient is established with neurologist, Dr. Milas Kocher, who manages her migraines. ? ?She is established with pediatric gastroenterologist, Dr. Lillia Mountain, who manages her recurrent vomiting, reflux, and  constipation. ? ?She is established with psychiatrist, Dr. Harrington Challenger, who manages her ADHD and depression. ? ?She is established with asthma and allergist, Dr. Ernst Bowler, who manages her asthma, allergies, intrinsic atopic dermatitis, and food allergies. ? ?She is established with pediatric cardiologist, Dr. Jimmye Norman, who manages her hypertension and postclosure of atrial septal defect.  She will also be seen later this week due to recently being diagnosed with POTS. ? ?She is established with pediatric ENT who manages her dermatitis of her ear canal, snoring, and sleep disordered breathing.  It looks like he wanted a sleep study completed, however I cannot see that that has been done. ? ?She is established with an OB/GYN for her birth control. ? ? ?Social history: ? ?Relevant past medical, surgical, family and social history reviewed and updated as indicated. Interim medical history since our last visit reviewed. ? ?Allergies and medications reviewed and updated. ? ?DATA REVIEWED: CHART IN EPIC ? ?ROS: Negative unless specifically indicated above in HPI.  ? ? ?Current Outpatient Medications:  ?  albuterol (PROVENTIL) (2.5 MG/3ML) 0.083% nebulizer solution, Take 3 mLs (2.5 mg total) by nebulization every 6 (six) hours as needed for wheezing or shortness of breath., Disp: 75 mL, Rfl: 0 ?  ascorbic acid (VITAMIN C) 500 MG tablet, Take 500 mg by mouth daily., Disp: , Rfl:  ?  atomoxetine (STRATTERA) 40 MG capsule, Take 1 capsule (40 mg total) by mouth daily., Disp: 30 capsule, Rfl: 2 ?  Azelastine-Fluticasone 137-50 MCG/ACT SUSP, Place 1 spray into both nostrils 2 (two) times daily., Disp: 23 g, Rfl: 2 ?  budesonide (PULMICORT) 0.5 MG/2ML nebulizer solution, Take 2 mLs (0.5 mg total) by nebulization 2 (two) times daily as needed., Disp: 120 mL, Rfl: 1 ?  budesonide-formoterol (SYMBICORT) 160-4.5 MCG/ACT inhaler, INHALE 2 PUFFS TWICE DAILY TO PREVENT COUGHING OR WHEEZING., Disp: 10.2 each, Rfl: 5 ?  busPIRone (BUSPAR) 10 MG  tablet, Take 1 tablet (10 mg total) by mouth 3 (three) times daily., Disp: 90 tablet, Rfl: 2 ?  calcium carbonate (OS-CAL) 1250 (500 Ca) MG chewable tablet, Chew by mouth., Disp: , Rfl:  ?  cariprazine (VRAYLAR) 1.5 MG capsule, Take 1 capsule (1.5 mg total) by mouth daily., Disp: 30 capsule, Rfl: 2 ?  Cholecalciferol 25 MCG (1000 UT) tablet, Take 1 Units by mouth daily at 2 PM., Disp: , Rfl:  ?  clobetasol (TEMOVATE) 0.05 % external solution, APPLY TO AFFECTED AREA TWICE A DAY, Disp: 50 mL, Rfl: 3 ?  clonazePAM (KLONOPIN) 0.5 MG tablet, Take 1 tablet (0.5 mg total) by mouth 2 (two) times daily., Disp: 60 tablet, Rfl: 2 ?  EPINEPHrine 0.3 mg/0.3 mL IJ SOAJ injection, Inject 0.3 mg into the muscle as needed for anaphylaxis., Disp: 2 each, Rfl: 2 ?  fexofenadine (ALLEGRA) 180 MG tablet, TAKE 1 TABLET BY MOUTH EVERY DAY, Disp: 30 tablet, Rfl: 5 ?  Fluocinolone Acetonide 0.01 % OIL, Place in ear(s)., Disp: , Rfl:  ?  Galcanezumab-gnlm 120 MG/ML SOAJ, Inject into the skin., Disp: , Rfl:  ?  hydrOXYzine (VISTARIL) 25 MG capsule, Take 1-2 capsules by mouth daily as needed., Disp: , Rfl:  ?  hyoscyamine (LEVSIN SL) 0.125 MG SL tablet, Take by mouth., Disp: , Rfl:  ?  metoprolol succinate (TOPROL-XL) 50 MG 24 hr tablet, Take 1 tablet by mouth daily., Disp: , Rfl:  ?  montelukast (SINGULAIR) 10 MG tablet, Take 1 tablet (10 mg total) by mouth daily., Disp: 30 tablet, Rfl: 5 ?  Multiple Vitamin (MULTIVITAMIN) capsule, Take by mouth., Disp: , Rfl:  ?  norethindrone (AYGESTIN) 5 MG tablet, Take 5 mg by mouth daily., Disp: , Rfl:  ?  nystatin (MYCOSTATIN/NYSTOP) powder, Apply 1 application topically 3 (three) times daily., Disp: 60 g, Rfl: 3 ?  Olopatadine HCl 0.2 % SOLN, Apply 1 drop to eye every morning., Disp: , Rfl:  ?  ondansetron (ZOFRAN-ODT) 4 MG disintegrating tablet, DISSOLVE ON TONGUE AT ONSET OF NAUSEA- 30 DAY SUPPLY, Disp: 10 tablet, Rfl: 1 ?  pantoprazole (PROTONIX) 40 MG tablet, Take 1 tablet by mouth in the morning  and at bedtime., Disp: , Rfl:  ?  Polyethylene Glycol 3350 (PEG 3350) POWD, 1 capfull (17g) mixed with 8 oz of clear liquid PO daily.  Adjust dose as needed to achieve soft stool daily., Disp: , Rfl:  ?  PROAIR HFA 108 (90 Base) MCG/ACT inhaler, Inhale 2 puffs into the lungs every 4 (four) hours as needed for wheezing or shortness of breath., Disp: 18 g, Rfl: 1 ?  sertraline (ZOLOFT) 50 MG tablet, Take 1.5 tablets (75 mg total) by mouth daily., Disp: 45 tablet, Rfl: 2 ?  Spacer/Aero-Holding Chambers [facility] DIAMOND) MISC, USE AS DIRECTED WITH INHALER, Disp: 1 each, Rfl: 1 ?  SUMAtriptan (IMITREX) 50 MG tablet, 1 tablet at onset of migraine. Repeat in 2 hours of needed. Limit to 2 doses/day, 2 times per week., Disp: , Rfl:  ?  zinc gluconate 50 MG tablet, Take 50  mg by mouth daily., Disp: , Rfl:  ?  ferrous sulfate 325 (65 FE) MG tablet, Take 1 tablet by mouth daily with breakfast. (Patient not taking: Reported on 04/04/2022), Disp: , Rfl:   ? ?Allergies  ?Allergen Reactions  ? Glucosamine Forte [Nutritional Supplements] Anaphylaxis  ?  Due to containing shellfish  ? Nutritional Supplements Anaphylaxis  ? Oyster Extract Anaphylaxis  ? Shellfish-Derived Products Anaphylaxis  ?  Throat swelling  ? Cats Claw (Uncaria Tomentosa) Nausea And Vomiting  ? Diamox [Acetazolamide] Other (See Comments)  ?  Numbness- feet, hands, lips  ? Lactose   ? Lactose Intolerance (Gi)   ? Topiramate Other (See Comments)  ?  Numbness to face, hands and feet  ? Bromfed Dm [Pseudoeph-Bromphen-Dm] Anxiety  ?  [place] DM  ? ?Past Medical History:  ?Diagnosis Date  ? Acid reflux 2010  ? Acne vulgaris 2017  ? ADHD (attention deficit hyperactivity disorder) 2011  ? Adverse food reaction 07/11/2016  ? Shellfish allergy  ? Amenorrhea 2019  ? Anaphylactic shock due to adverse food reaction 01/15/2018  ? Anxiety 2017  ? Chronic constipation 09/05/2011  ? Chronic tension headaches 2011  ? originally followed by [place]. Then Dr  Consuello Bossier [facility] 03/2020 (see above)   ? Coalition, calcaneus navicular   ? Dysmenorrhea   ? Eczema   ? Eustachian tube dysfunction, left 10/23/2019  ? Frequent headaches   ? Galactorrhea   ? Gastritis determined by bi

## 2022-04-05 LAB — ANEMIA PROFILE B
Basophils Absolute: 0 10*3/uL (ref 0.0–0.2)
Basos: 0 %
EOS (ABSOLUTE): 0.1 10*3/uL (ref 0.0–0.4)
Eos: 1 %
Ferritin: 232 ng/mL — ABNORMAL HIGH (ref 15–77)
Folate: >20 ng/mL (ref >3.0)
Hematocrit: 43.4 % (ref 34.0–46.6)
Hemoglobin: 15.1 g/dL (ref 11.1–15.9)
Immature Grans (Abs): 0 10*3/uL (ref 0.0–0.1)
Immature Granulocytes: 0 %
Iron Saturation: 20 % (ref 15–55)
Iron: 78 ug/dL (ref 27–159)
Lymphocytes Absolute: 2.7 10*3/uL (ref 0.7–3.1)
Lymphs: 28 %
MCH: 31.8 pg (ref 26.6–33.0)
MCHC: 34.8 g/dL (ref 31.5–35.7)
MCV: 91 fL (ref 79–97)
Monocytes Absolute: 0.6 10*3/uL (ref 0.1–0.9)
Monocytes: 6 %
Neutrophils Absolute: 6.1 10*3/uL (ref 1.4–7.0)
Neutrophils: 65 %
Platelets: 287 10*3/uL (ref 150–450)
RBC: 4.75 x10E6/uL (ref 3.77–5.28)
RDW: 12.8 % (ref 11.7–15.4)
Retic Ct Pct: 2.1 % (ref 0.6–2.6)
Total Iron Binding Capacity: 387 ug/dL (ref 250–450)
UIBC: 309 ug/dL (ref 131–425)
Vitamin B-12: 678 pg/mL (ref 232–1245)
WBC: 9.4 10*3/uL (ref 3.4–10.8)

## 2022-04-06 ENCOUNTER — Encounter: Payer: Self-pay | Admitting: Family Medicine

## 2022-04-18 ENCOUNTER — Telehealth (HOSPITAL_COMMUNITY): Payer: Medicaid Other | Admitting: Psychiatry

## 2022-04-19 ENCOUNTER — Other Ambulatory Visit: Payer: Self-pay | Admitting: Pediatrics

## 2022-04-19 ENCOUNTER — Other Ambulatory Visit: Payer: Self-pay | Admitting: Allergy & Immunology

## 2022-04-19 ENCOUNTER — Encounter (HOSPITAL_COMMUNITY): Payer: Self-pay | Admitting: Psychiatry

## 2022-04-19 ENCOUNTER — Telehealth (INDEPENDENT_AMBULATORY_CARE_PROVIDER_SITE_OTHER): Payer: Medicaid Other | Admitting: Psychiatry

## 2022-04-19 DIAGNOSIS — F333 Major depressive disorder, recurrent, severe with psychotic symptoms: Secondary | ICD-10-CM | POA: Diagnosis not present

## 2022-04-19 DIAGNOSIS — F902 Attention-deficit hyperactivity disorder, combined type: Secondary | ICD-10-CM | POA: Diagnosis not present

## 2022-04-19 MED ORDER — ATOMOXETINE HCL 60 MG PO CAPS: 60.0000 mg | ORAL_CAPSULE | Freq: Every day | ORAL | 2 refills | Status: DC

## 2022-04-19 MED ORDER — SERTRALINE HCL 50 MG PO TABS: 75.0000 mg | ORAL_TABLET | Freq: Every day | ORAL | 2 refills | Status: DC

## 2022-04-19 MED ORDER — HYDROXYZINE PAMOATE 25 MG PO CAPS: 25.0000 mg | ORAL_CAPSULE | Freq: Every day | ORAL | 2 refills | Status: DC | PRN

## 2022-04-19 MED ORDER — CARIPRAZINE HCL 1.5 MG PO CAPS: 1.5000 mg | ORAL_CAPSULE | Freq: Every day | ORAL | 2 refills | Status: DC

## 2022-04-19 MED ORDER — CLONAZEPAM 0.5 MG PO TABS: 0.5000 mg | ORAL_TABLET | Freq: Two times a day (BID) | ORAL | 2 refills | Status: DC

## 2022-04-19 MED ORDER — BUSPIRONE HCL 10 MG PO TABS: 10.0000 mg | ORAL_TABLET | Freq: Three times a day (TID) | ORAL | 2 refills | Status: DC

## 2022-04-19 NOTE — Progress Notes (Signed)
Virtual Visit via Telephone Note ? ?I connected with Collene GobbleKatherine M Flansburg on 04/19/22 at  2:40 PM EDT by telephone and verified that I am speaking with the correct person using two identifiers. ? ?Location: ?Patient: home ?Provider: office ?  ?I discussed the limitations, risks, security and privacy concerns of performing an evaluation and management service by telephone and the availability of in person appointments. I also discussed with the patient that there may be a patient responsible charge related to this service. The patient expressed understanding and agreed to proceed. ? ? ?  ?I discussed the assessment and treatment plan with the patient. The patient was provided an opportunity to ask questions and all were answered. The patient agreed with the plan and demonstrated an understanding of the instructions. ?  ?The patient was advised to call back or seek an in-person evaluation if the symptoms worsen or if the condition fails to improve as anticipated. ? ?I provided 15 minutes of non-face-to-face time during this encounter. ? ? ?Diannia Rudereborah Shanedra Lave, MD ? ?BH MD/PA/NP OP Progress Note ? ?04/19/2022 3:10 PM ?Collene GobbleKatherine M Bergh  ?MRN:  [id number] ? ?Chief Complaint:  ?Chief Complaint  ?Patient presents with  ? Depression  ? Anxiety  ? ADD  ? Follow-up  ? ?HPI: This patient is an 21 year old female who lives with her mother, mother's fianc? and a younger [place].  She rarely has contact with her biological father.  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 ?The patient returns for follow-up after 2 months.  She is concerned because she is worried that she may not pass her English class and then not be able to graduate.  She has a email sent into the teacher and is awaiting instruction.  She has not been sleeping that well possibly because of the worry regarding school.  I suggest that she use the hydroxyzine that she has for headaches to help her sleep at night as well.  She  wants to relax the summer and then start [employer] in the fall.  She asked if she can increase the Strattera but because she is not concentrating that well and I think this is reasonable.  She denies significant depression or anxiety but is still dealing with the POTS as well as migraine headache. ?  ?Visit Diagnosis:  ?  ICD-10-CM   ?1. Attention deficit hyperactivity disorder (ADHD), combined type  F90.2   ?  ?2. Severe episode of recurrent major depressive disorder, with psychotic features (HCC)  F33.3   ?  ? ? ?Past Psychiatric History: Prior psychiatric hospitalizations, [facility] ? ?Past Medical History:  ?Past Medical History:  ?Diagnosis Date  ? Acid reflux 2010  ? Acne vulgaris 2017  ? ADHD (attention deficit hyperactivity disorder) 2011  ? Adverse food reaction 07/11/2016  ? Shellfish allergy  ? Amenorrhea 2019  ? Anaphylactic shock due to adverse food reaction 01/15/2018  ? Anxiety 2017  ? Chronic constipation 09/05/2011  ? Chronic tension headaches 2011  ? [facility] (see above[facility]   ? Coalition, calcaneus navicular   ? Dysmenorrhea   ? Eczema   ? Eustachian tube dysfunction, left 10/23/2019  ? Frequent headaches   ? Galactorrhea   ? 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 04/01/2019  ? Hiatal hernia   ? History of atrial septal defect repair 12/04/2011  ? Hyperlipidemia 2013  ? Hypertension 2011  ?  Hypertension 09/28/2020  ? hypertension, cellulitus   ? Idiopathic urticaria 09/05/2011  ? Insomnia   ? Keratosis pilaris 01/15/2018  ? Mass of left thigh 11/22/2017  ? Overview:  Added automatically from request for surgery [id number]  ? MDD (major depressive disorder), severe (HCC) 03/06/2018  ? Migraine 2011  ? re-diagnosed as chronic migraines 03/2020 [facility] - starting on preventative   ? Obesity without serious comorbidity with body mass index (BMI) in 95th to 98th percentile for age in pediatric patient 09/04/2018  ? Piriformis  syndrome   ? POTS (postural orthostatic tachycardia syndrome)   ? PTSD (post-traumatic stress disorder)   ? Scoliosis 2019  ? Seasonal and perennial allergic rhinitis 2010  ? Severe persistent asthma without complication 2010  ? Sleep paralysis   ? Suicidal ideation 03/07/2018  ? Tic disorder 2019  ?  ?Past Surgical History:  ?Procedure Laterality Date  ? ASD REPAIR  2012  ? with Helix, via Cardiac Catheterization  ? atrial septal defecgt    ? CARDIAC SURGERY    ? DENTAL SURGERY  2020  ? lipoma removal  2019  ? LUMBAR PUNCTURE  05/20/2021  ? TONSILLECTOMY AND ADENOIDECTOMY  2010  ? ? ?Family Psychiatric History: see below ? ?Family History:  ?Family History  ?Problem Relation Age of Onset  ? Hypertension Mother   ? Hyperlipidemia Mother   ? Diabetes Mother   ? Depression Mother   ? Arthritis Mother   ? Allergic rhinitis Mother   ? Asthma Mother   ? Bipolar disorder Mother   ? Anxiety disorder Mother   ? Mental illness Mother   ? Miscarriages / [place] Mother   ? Thyroid disease Mother   ? Bipolar disorder Father   ? Drug abuse Father   ? Alcohol abuse Father   ? ADD / ADHD Father   ? Mental illness Father   ? Allergic rhinitis Brother   ? Asthma Brother   ? ADD / ADHD Brother   ? Learning disabilities Brother   ? Hypertension Maternal Grandmother   ? Hyperlipidemia Maternal Grandmother   ? Heart disease Maternal Grandmother   ? Bipolar disorder Maternal Grandfather   ? Angioedema Neg Hx   ? Eczema Neg Hx   ? Immunodeficiency Neg Hx   ? Urticaria Neg Hx   ? ? ?Social History:  ?Social History  ? ?Socioeconomic History  ? Marital status: Single  ?  Spouse name: Not on file  ? Number of children: Not on file  ? Years of education: Not on file  ? Highest education level: Not on file  ?Occupational History  ? Not on file  ?Tobacco Use  ? Smoking status: Never  ? Smokeless tobacco: Never  ?Vaping Use  ? Vaping Use: Never used  ?Substance and Sexual Activity  ? Alcohol use: No  ?  Alcohol/week: 0.0 standard drinks  ?  Drug use: No  ? Sexual activity: Yes  ?Other Topics Concern  ? Not on file  ?Social History Narrative  ? Not on file  ? ?Social Determinants of Health  ? ?Financial Resource Strain: Not on file  ?Food Insecurity: Not on file  ?Transportation Needs: Not on file  ?Physical Activity: Not on file  ?Stress: Not on file  ?Social Connections: Not on file  ? ? ?Allergies:  ?Allergies  ?Allergen Reactions  ? Glucosamine Forte [Nutritional Supplements] Anaphylaxis  ?  Due to containing shellfish  ? Nutritional Supplements Anaphylaxis  ? Oyster Extract Anaphylaxis  ?  Shellfish-Derived Products Anaphylaxis  ?  Throat swelling  ? Cats Claw (Uncaria Tomentosa) Nausea And Vomiting  ? Diamox [Acetazolamide] Other (See Comments)  ?  Numbness- feet, hands, lips  ? Lactose   ? Lactose Intolerance (Gi)   ? Topiramate Other (See Comments)  ?  Numbness to face, hands and feet  ? Bromfed Dm [Pseudoeph-Bromphen-Dm] Anxiety  ?  CARBOFED DM  ? ? ?Metabolic Disorder Labs: ?Lab Results  ?Component Value Date  ? HGBA1C 5.2 03/06/2022  ? MPG 105.41 03/07/2018  ? ?Lab Results  ?Component Value Date  ? PROLACTIN 49.7 (H) 03/07/2018  ? ?Lab Results  ?Component Value Date  ? CHOL 192 (H) 03/06/2022  ? TRIG 84 03/06/2022  ? HDL 39 (L) 03/06/2022  ? CHOLHDL 4.9 (H) 03/06/2022  ? VLDL 20 03/07/2018  ? LDLCALC 138 (H) 03/06/2022  ? LDLCALC 105 11/26/2019  ? ?Lab Results  ?Component Value Date  ? TSH 1.550 03/06/2022  ? TSH 2.434 03/07/2018  ? ? ?Therapeutic Level Labs: ?No results found for: LITHIUM ?No results found for: VALPROATE ?No components found for:  CBMZ ? ?Current Medications: ?Current Outpatient Medications  ?Medication Sig Dispense Refill  ? atomoxetine (STRATTERA) 60 MG capsule Take 1 capsule (60 mg total) by mouth daily. 30 capsule 2  ? albuterol (PROVENTIL) (2.5 MG/3ML) 0.083% nebulizer solution Take 3 mLs (2.5 mg total) by nebulization every 6 (six) hours as needed for wheezing or shortness of breath. 75 mL 0  ? ascorbic acid (VITAMIN C)  500 MG tablet Take 500 mg by mouth daily.    ? atomoxetine (STRATTERA) 40 MG capsule Take 1 capsule (40 mg total) by mouth daily. 30 capsule 2  ? Azelastine-Fluticasone 137-50 MCG/ACT SUSP Place 1 spray in

## 2022-04-29 LAB — STREP PNEUMONIAE 23 SEROTYPES IGG
Pneumo Ab Type 1*: 8.2 ug/mL (ref >1.3)
Pneumo Ab Type 12 (12F)*: 5.9 ug/mL (ref >1.3)
Pneumo Ab Type 14*: >18.7 ug/mL (ref >1.3)
Pneumo Ab Type 17 (17F)*: 16.2 ug/mL (ref >1.3)
Pneumo Ab Type 19 (19F)*: 28.4 ug/mL (ref >1.3)
Pneumo Ab Type 2*: 11.7 ug/mL (ref >1.3)
Pneumo Ab Type 20*: 4.4 ug/mL (ref >1.3)
Pneumo Ab Type 22 (22F)*: 7.7 ug/mL (ref >1.3)
Pneumo Ab Type 23 (23F)*: 4.7 ug/mL (ref >1.3)
Pneumo Ab Type 26 (6B)*: 9.6 ug/mL (ref >1.3)
Pneumo Ab Type 3*: 1.5 ug/mL (ref >1.3)
Pneumo Ab Type 34 (10A)*: >19.5 ug/mL (ref >1.3)
Pneumo Ab Type 4*: 3.5 ug/mL (ref >1.3)
Pneumo Ab Type 43 (11A)*: 0.9 ug/mL — ABNORMAL LOW (ref >1.3)
Pneumo Ab Type 5*: 1.9 ug/mL (ref >1.3)
Pneumo Ab Type 51 (7F)*: 0.6 ug/mL — ABNORMAL LOW (ref >1.3)
Pneumo Ab Type 54 (15B)*: 3.4 ug/mL (ref >1.3)
Pneumo Ab Type 56 (18C)*: 1.6 ug/mL (ref >1.3)
Pneumo Ab Type 57 (19A)*: 6.8 ug/mL (ref >1.3)
Pneumo Ab Type 68 (9V)*: 2.7 ug/mL (ref >1.3)
Pneumo Ab Type 70 (33F)*: 2.9 ug/mL (ref >1.3)
Pneumo Ab Type 8*: >25.3 ug/mL (ref >1.3)
Pneumo Ab Type 9 (9N)*: >21.7 ug/mL (ref >1.3)

## 2022-04-30 ENCOUNTER — Encounter: Payer: Self-pay | Admitting: Family Medicine

## 2022-04-30 ENCOUNTER — Encounter: Payer: Self-pay | Admitting: Allergy & Immunology

## 2022-05-04 ENCOUNTER — Ambulatory Visit (INDEPENDENT_AMBULATORY_CARE_PROVIDER_SITE_OTHER): Payer: Medicaid Other | Admitting: Nurse Practitioner

## 2022-05-04 ENCOUNTER — Encounter: Payer: Self-pay | Admitting: Nurse Practitioner

## 2022-05-04 DIAGNOSIS — J069 Acute upper respiratory infection, unspecified: Secondary | ICD-10-CM

## 2022-05-04 MED ORDER — AMOXICILLIN-POT CLAVULANATE 875-125 MG PO TABS: 1.0000 | ORAL_TABLET | Freq: Two times a day (BID) | ORAL | 0 refills | Status: DC

## 2022-05-04 MED ORDER — PREDNISONE 20 MG PO TABS
40.0000 mg | ORAL_TABLET | Freq: Every day | ORAL | 0 refills | Status: AC
Start: 2022-05-04 — End: 2022-05-09

## 2022-05-04 NOTE — Progress Notes (Signed)
Virtual Visit  Note Due to COVID-19 pandemic this visit was conducted virtually. This visit type was conducted due to national recommendations for restrictions regarding the COVID-19 Pandemic (e.g. social distancing, sheltering in place) in an effort to limit this patient's exposure and mitigate transmission in our community. All issues noted in this document were discussed and addressed.  A physical exam was not performed with this format.  I connected with Molly [name] on 05/04/22 at 4:13 by telephone and verified that I am speaking with the correct person using two identifiers. Molly [name] is currently located at home and  her mom is currently with her during visit. The provider, Mary-Margaret Daphine Deutscher, FNP is located in their office at time of visit.  I discussed the limitations, risks, security and privacy concerns of performing an evaluation and management service by telephone and the availability of in person appointments. I also discussed with the patient that there may be a patient responsible charge related to this service. The patient expressed understanding and agreed to proceed.   History and Present Illness:  URI  This is a new problem. Episode onset: 3 days. The problem has been gradually worsening. Associated symptoms include chest pain, congestion, coughing, ear pain, headaches, rhinorrhea, sinus pain and a sore throat. She has tried acetaminophen for the symptoms. The treatment provided mild relief.  Her om has been sick for > a week.     Review of Systems  HENT:  Positive for congestion, ear pain, rhinorrhea, sinus pain and sore throat.   Respiratory:  Positive for cough.   Cardiovascular:  Positive for chest pain.  Neurological:  Positive for headaches.    Observations/Objective: Alert and oriented- answers all questions appropriately No distress Raspy voice  Assessment and Plan: Molly [name] in today with chief complaint of No chief  complaint on file.   1. URI with cough and congestion 1. Take meds as prescribed 2. Use a cool mist humidifier especially during the winter months and when heat has been humid. 3. Use saline nose sprays frequently 4. Saline irrigations of the nose can be very helpful if done frequently.  * 4X daily for 1 week*  * Use of a nettie pot can be helpful with this. Follow directions with this* 5. Drink plenty of fluids 6. Keep thermostat turn down low 7.For any cough or congestion- mucinex OTC 8. For fever or aces or pains- take tylenol or ibuprofen appropriate for age and weight.  * for fevers greater than 101 orally you may alternate ibuprofen and tylenol every  3 hours.    - amoxicillin-clavulanate (AUGMENTIN) 875-125 MG tablet; Take 1 tablet by mouth 2 (two) times daily.  Dispense: 10 tablet; Refill: 0 - predniSONE (DELTASONE) 20 MG tablet; Take 2 tablets (40 mg total) by mouth daily with breakfast for 5 days. 2 po daily for 5 days  Dispense: 10 tablet; Refill: 0 - Novel Coronavirus, NAA ([facility]); Future - Rapid Strep Screen ([facility] ONLY); Future    Follow Up Instructions: prn    I discussed the assessment and treatment plan with the patient. The patient was provided an opportunity to ask questions and all were answered. The patient agreed with the plan and demonstrated an understanding of the instructions.   The patient was advised to call back or seek an in-person evaluation if the symptoms worsen or if the condition fails to improve as anticipated.  The above assessment and management plan was discussed with the patient. The patient  verbalized understanding of and has agreed to the management plan. Patient is aware to call the clinic if symptoms persist or worsen. Patient is aware when to return to the clinic for a follow-up visit. Patient educated on when it is appropriate to go to the emergency department.   Time call ended:  4:25  I provided 12 minutes of  non  face-to-face time during this encounter.    Mary-Margaret Daphine Deutscher, FNP

## 2022-05-05 ENCOUNTER — Telehealth: Payer: Medicaid Other | Admitting: Family Medicine

## 2022-05-05 LAB — CULTURE, GROUP A STREP

## 2022-05-05 LAB — RAPID STREP SCREEN (MED CTR MEBANE ONLY): Strep Gp A Ag, IA W/Reflex: NEGATIVE

## 2022-05-06 LAB — NOVEL CORONAVIRUS, NAA: SARS-CoV-2, NAA: NOT DETECTED

## 2022-05-09 ENCOUNTER — Encounter: Payer: Self-pay | Admitting: Allergy & Immunology

## 2022-05-31 ENCOUNTER — Encounter: Payer: Self-pay | Admitting: Family Medicine

## 2022-06-14 ENCOUNTER — Telehealth: Payer: Self-pay | Admitting: Allergy & Immunology

## 2022-06-14 NOTE — Telephone Encounter (Signed)
I called patient's mother and asked her to give me a call about an issue. I asked her to call me back at the office or on my work cell.   Malachi Bonds, MD [facility]

## 2022-06-15 NOTE — Telephone Encounter (Signed)
I contacted Molly [name] or the patient's caregiver to inform them that she received an expired dose of Pfizer COVID-19 vaccine from our office, during their visit(s), on June 2022. The dose was expired by 16 days. This was the patient's 2nd dose. I shared the following information with the patient or caregiver: vaccines given after they have expired may be less effective but we are not aware of any other adverse effects. The patient can be re-vaccinated at no cost if the patient decides to do so. Answered patient questions/concerns. Encouraged patient to reach out if they have any additional questions or concerns.  The patient decided to be re-vaccinated with the current COVID19 vaccine on the next available COVID clinic.

## 2022-06-23 ENCOUNTER — Telehealth (INDEPENDENT_AMBULATORY_CARE_PROVIDER_SITE_OTHER): Payer: Medicaid Other | Admitting: Psychiatry

## 2022-06-23 ENCOUNTER — Encounter (HOSPITAL_COMMUNITY): Payer: Self-pay | Admitting: Psychiatry

## 2022-06-23 DIAGNOSIS — F333 Major depressive disorder, recurrent, severe with psychotic symptoms: Secondary | ICD-10-CM | POA: Diagnosis not present

## 2022-06-23 DIAGNOSIS — F902 Attention-deficit hyperactivity disorder, combined type: Secondary | ICD-10-CM | POA: Diagnosis not present

## 2022-06-23 MED ORDER — SERTRALINE HCL 50 MG PO TABS: 75.0000 mg | ORAL_TABLET | Freq: Every day | ORAL | 2 refills | Status: DC

## 2022-06-23 MED ORDER — BUSPIRONE HCL 10 MG PO TABS: 10.0000 mg | ORAL_TABLET | Freq: Three times a day (TID) | ORAL | 2 refills | Status: DC

## 2022-06-23 MED ORDER — CARIPRAZINE HCL 1.5 MG PO CAPS: 1.5000 mg | ORAL_CAPSULE | Freq: Every day | ORAL | 2 refills | Status: DC

## 2022-06-23 MED ORDER — CLONAZEPAM 0.5 MG PO TABS
0.5000 mg | ORAL_TABLET | Freq: Two times a day (BID) | ORAL | 2 refills | Status: DC
Start: 2022-06-23 — End: 2022-08-22

## 2022-06-23 MED ORDER — HYDROXYZINE PAMOATE 25 MG PO CAPS: 25.0000 mg | ORAL_CAPSULE | Freq: Every day | ORAL | 2 refills | Status: DC | PRN

## 2022-06-23 MED ORDER — ATOMOXETINE HCL 60 MG PO CAPS
60.0000 mg | ORAL_CAPSULE | Freq: Every day | ORAL | 2 refills | Status: DC
Start: 2022-06-23 — End: 2022-08-22

## 2022-06-23 NOTE — Progress Notes (Signed)
Virtual Visit via Telephone Note  I connected with Molly [name] on 06/23/22 at 10:00 AM EDT by telephone and verified that I am speaking with the correct person using two identifiers.  Location: Patient: home Provider: office   I discussed the limitations, risks, security and privacy concerns of performing an evaluation and management service by telephone and the availability of in person appointments. I also discussed with the patient that there may be a patient responsible charge related to this service. The patient expressed understanding and agreed to proceed.       I discussed the assessment and treatment plan with the patient. The patient was provided an opportunity to ask questions and all were answered. The patient agreed with the plan and demonstrated an understanding of the instructions.   The patient was advised to call back or seek an in-person evaluation if the symptoms worsen or if the condition fails to improve as anticipated.  I provided 15 minutes of non-face-to-face time during this encounter.   Levonne Spiller, MD  [facility] MD/PA/NP OP Progress Note  06/23/2022 10:23 AM Molly [name]  MRN:  [id number]  Chief Complaint:  Chief Complaint  Patient presents with   Anxiety   Depression   Follow-up   HPI:  This patient is an 21 year old female who lives with her mother, mother's fianc and a younger brother in [place].  She rarely has contact with her biological father.  She just completed 12th grader in [employer], online school  The patient returns for follow-up after 2 months.  For the most part she has been doing pretty well.  She has completed high school and is now applying to [employer].  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  She states she is a little bit anxious about the whole process but overall she is handling it well.  She is sleeping well most of the time but sometimes wakes up through the  night.  She is still dealing with migraine headaches but being on Emgality has reduced the frequency quite a bit.  She is also still struggling with POTS.  She knows she has to drink a lot of fluids and rest when she needs to.  Overall her mood is pretty good and she denies significant depression anxiety panic attacks thoughts of self-harm or suicide.  She denies significant mood swings. Visit Diagnosis:    ICD-10-CM   1. Severe episode of recurrent major depressive disorder, with psychotic features ([place])  F33.3     2. Attention deficit hyperactivity disorder (ADHD), combined type  F90.2       Past Psychiatric History: Prior psychiatric hospitalizations, [facility]  Past Medical History:  Past Medical History:  Diagnosis Date   Acid reflux 2010   Acne vulgaris 2017   ADHD (attention deficit hyperactivity disorder) 2011   Adverse food reaction 07/11/2016   Shellfish allergy   Amenorrhea 2019   Anaphylactic shock due to adverse food reaction 01/15/2018   Anxiety 2017   Chronic constipation 09/05/2011   Chronic tension headaches 2011   originally followed by [facility] K.Griffin. Then Dr Consuello [facility] 03/2020 (see above)    Coalition, calcaneus navicular    Dysmenorrhea    Eczema    Eustachian tube dysfunction, left 10/23/2019   Frequent headaches    Galactorrhea    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 04/01/2019   Hiatal hernia    History of atrial septal defect  repair 12/04/2011   Hyperlipidemia 2013   Hypertension 2011   Hypertension 09/28/2020   hypertension, cellulitus    Idiopathic urticaria 09/05/2011   Insomnia    Keratosis pilaris 01/15/2018   Mass of left thigh 11/22/2017   Overview:  Added automatically from request for surgery [id number]   MDD (major depressive disorder), severe (HCC) 03/06/2018   Migraine 2011   re-diagnosed as chronic migraines 03/2020 [facility] - starting on preventative    Obesity without serious comorbidity with body mass  index (BMI) in 95th to 98th percentile for age in pediatric patient 09/04/2018   Piriformis syndrome    POTS (postural orthostatic tachycardia syndrome)    PTSD (post-traumatic stress disorder)    Scoliosis 2019   Seasonal and perennial allergic rhinitis 2010   Severe persistent asthma without complication 2010   Sleep paralysis    Suicidal ideation 03/07/2018   Tic disorder 2019    Past Surgical History:  Procedure Laterality Date   ASD REPAIR  2012   with Helix, via Cardiac Catheterization   atrial septal defecgt     CARDIAC SURGERY     DENTAL SURGERY  2020   lipoma removal  2019   LUMBAR PUNCTURE  05/20/2021   TONSILLECTOMY AND ADENOIDECTOMY  2010    Family Psychiatric History: see below  Family History:  Family History  Problem Relation Age of Onset   Hypertension Mother    Hyperlipidemia Mother    Diabetes Mother    Depression Mother    Arthritis Mother    Allergic rhinitis Mother    Asthma Mother    Bipolar disorder Mother    Anxiety disorder Mother    Mental illness Mother    Miscarriages / [place] Mother    Thyroid disease Mother    Bipolar disorder Father    Drug abuse Father    Alcohol abuse Father    ADD / ADHD Father    Mental illness Father    Allergic rhinitis Brother    Asthma Brother    ADD / ADHD Brother    Learning disabilities Brother    Hypertension Maternal Grandmother    Hyperlipidemia Maternal Grandmother    Heart disease Maternal Grandmother    Bipolar disorder Maternal Grandfather    Angioedema Neg Hx    Eczema Neg Hx    Immunodeficiency Neg Hx    Urticaria Neg Hx     Social History:  Social History   Socioeconomic History   Marital status: Single    Spouse name: Not on file   Number of children: Not on file   Years of education: Not on file   Highest education level: Not on file  Occupational History   Not on file  Tobacco Use   Smoking status: Never   Smokeless tobacco: Never  Vaping Use   Vaping Use: Never used   Substance and Sexual Activity   Alcohol use: No    Alcohol/week: 0.0 standard drinks of alcohol   Drug use: No   Sexual activity: Yes  Other Topics Concern   Not on file  Social History Narrative   Not on file   Social Determinants of Health   Financial Resource Strain: Not on file  Food Insecurity: Not on file  Transportation Needs: Not on file  Physical Activity: Not on file  Stress: Not on file  Social Connections: Not on file    Allergies:  Allergies  Allergen Reactions   Glucosamine Forte [Nutritional Supplements] Anaphylaxis    Due to  containing shellfish   Nutritional Supplements Anaphylaxis   Oyster Extract Anaphylaxis   Shellfish-Derived Products Anaphylaxis    Throat swelling   Cats Claw (Uncaria Tomentosa) Nausea And Vomiting   Diamox [Acetazolamide] Other (See Comments)    Numbness- feet, hands, lips   Lactose    Lactose Intolerance (Gi)    Topiramate Other (See Comments)    Numbness to face, hands and feet   Bromfed Dm [Pseudoeph-Bromphen-Dm] Anxiety    CARBOFED DM    Metabolic Disorder Labs: Lab Results  Component Value Date   HGBA1C 5.2 03/06/2022   MPG 105.41 03/07/2018   Lab Results  Component Value Date   PROLACTIN 49.7 (H) 03/07/2018   Lab Results  Component Value Date   CHOL 192 (H) 03/06/2022   TRIG 84 03/06/2022   HDL 39 (L) 03/06/2022   CHOLHDL 4.9 (H) 03/06/2022   VLDL 20 03/07/2018   LDLCALC 138 (H) 03/06/2022   LDLCALC 105 11/26/2019   Lab Results  Component Value Date   TSH 1.550 03/06/2022   TSH 2.434 03/07/2018    Therapeutic Level Labs: No results found for: "LITHIUM" No results found for: "VALPROATE" No results found for: "CBMZ"  Current Medications: Current Outpatient Medications  Medication Sig Dispense Refill   albuterol (PROVENTIL) (2.5 MG/3ML) 0.083% nebulizer solution Take 3 mLs (2.5 mg total) by nebulization every 6 (six) hours as needed for wheezing or shortness of breath. 75 mL 0   ascorbic acid  (VITAMIN C) 500 MG tablet Take 500 mg by mouth daily.     atomoxetine (STRATTERA) 60 MG capsule Take 1 capsule (60 mg total) by mouth daily. 30 capsule 2   Azelastine-Fluticasone 137-50 MCG/ACT SUSP Place 1 spray into both nostrils 2 (two) times daily. 23 g 2   budesonide (PULMICORT) 0.5 MG/2ML nebulizer solution TAKE 2 MLS (0.5 MG TOTAL) BY NEBULIZATION 2 (TWO) TIMES DAILY AS NEEDED. 120 mL 1   budesonide-formoterol (SYMBICORT) 160-4.5 MCG/ACT inhaler INHALE 2 PUFFS TWICE DAILY TO PREVENT COUGHING OR WHEEZING. 10.2 each 5   busPIRone (BUSPAR) 10 MG tablet Take 1 tablet (10 mg total) by mouth 3 (three) times daily. 90 tablet 2   calcium carbonate (OS-CAL) 1250 (500 Ca) MG chewable tablet Chew by mouth.     cariprazine (VRAYLAR) 1.5 MG capsule Take 1 capsule (1.5 mg total) by mouth daily. 30 capsule 2   Cholecalciferol 25 MCG (1000 UT) tablet Take 1 Units by mouth daily at 2 PM.     clobetasol (TEMOVATE) 0.05 % external solution APPLY TO AFFECTED AREA TWICE A DAY 50 mL 3   clonazePAM (KLONOPIN) 0.5 MG tablet Take 1 tablet (0.5 mg total) by mouth 2 (two) times daily. 60 tablet 2   EPINEPHrine 0.3 mg/0.3 mL IJ SOAJ injection Inject 0.3 mg into the muscle as needed for anaphylaxis. 2 each 2   ferrous sulfate 325 (65 FE) MG tablet Take 1 tablet by mouth daily with breakfast. (Patient not taking: Reported on 04/04/2022)     fexofenadine (ALLEGRA) 180 MG tablet TAKE 1 TABLET BY MOUTH EVERY DAY 30 tablet 5   Fluocinolone Acetonide 0.01 % OIL Place in ear(s).     fluticasone (FLOVENT HFA) 220 MCG/ACT inhaler DURING ASTHMA FLARES INHALE 2 PUFFS TWICE DAILY FOR TWO WEEKS THEN STOP. 12 each 3   Galcanezumab-gnlm 120 MG/ML SOAJ Inject into the skin.     hydrOXYzine (VISTARIL) 25 MG capsule Take 1-2 capsules (25-50 mg total) by mouth daily as needed. 60 capsule 2   hyoscyamine (LEVSIN  SL) 0.125 MG SL tablet Take by mouth.     metoprolol succinate (TOPROL-XL) 50 MG 24 hr tablet Take 1 tablet by mouth daily.      montelukast (SINGULAIR) 10 MG tablet Take 1 tablet (10 mg total) by mouth daily. 30 tablet 5   Multiple Vitamin (MULTIVITAMIN) capsule Take by mouth.     norethindrone (AYGESTIN) 5 MG tablet Take 5 mg by mouth daily.     nystatin (MYCOSTATIN/NYSTOP) powder Apply 1 application topically 3 (three) times daily. 60 g 3   Olopatadine HCl 0.2 % SOLN Apply 1 drop to eye every morning.     ondansetron (ZOFRAN-ODT) 4 MG disintegrating tablet DISSOLVE ON TONGUE AT ONSET OF NAUSEA- 30 DAY SUPPLY 10 tablet 1   pantoprazole (PROTONIX) 40 MG tablet Take 1 tablet by mouth in the morning and at bedtime.     Polyethylene Glycol 3350 (PEG 3350) POWD 1 capfull (17g) mixed with 8 oz of clear liquid PO daily.  Adjust dose as needed to achieve soft stool daily.     PROAIR HFA 108 (90 Base) MCG/ACT inhaler Inhale 2 puffs into the lungs every 4 (four) hours as needed for wheezing or shortness of breath. 18 g 1   sertraline (ZOLOFT) 50 MG tablet Take 1.5 tablets (75 mg total) by mouth daily. 45 tablet 2   SUMAtriptan (IMITREX) 50 MG tablet 1 tablet at onset of migraine. Repeat in 2 hours of needed. Limit to 2 doses/day, 2 times per week.     zinc gluconate 50 MG tablet Take 50 mg by mouth daily.     No current facility-administered medications for this visit.     Musculoskeletal: Strength & Muscle Tone: na Gait & Station: na Patient leans: N/A  Psychiatric Specialty Exam: Review of Systems  Neurological:  Positive for light-headedness and headaches.  Psychiatric/Behavioral:  The patient is nervous/anxious.   All other systems reviewed and are negative.   There were no vitals taken for this visit.There is no height or weight on file to calculate BMI.  General Appearance: NA  Eye Contact:  NA  Speech:  Clear and Coherent  Volume:  Normal  Mood:  Anxious and Euthymic  Affect:  NA  Thought Process:  Goal Directed  Orientation:  Full (Time, Place, and Person)  Thought Content: WDL   Suicidal Thoughts:  No   Homicidal Thoughts:  No  Memory:  Immediate;   Good Recent;   Good Remote;   Fair  Judgement:  Good  Insight:  Fair  Psychomotor Activity:  Normal  Concentration:  Concentration: Good and Attention Span: Good  Recall:  Good  Fund of Knowledge: Good  Language: Good  Akathisia:  No  Handed:  Right  AIMS (if indicated): not done  Assets:  Communication Skills Desire for Improvement Resilience Social Support Talents/Skills  ADL's:  Intact  Cognition: WNL  Sleep:  Fair   Screenings: GAD-7    Flowsheet Row Office Visit from 04/04/2022 in [place]  Total GAD-7 Score 2      PHQ2-9    Flowsheet Row Video Visit from 06/23/2022 in [facility] Video Visit from 04/19/2022 in [place] Office Visit from 04/04/2022 in [place] Video Visit from 02/16/2022 in [place] Office Visit from 02/06/2022 in [facility]  PHQ-2 Total Score 0 0 1 0 0  PHQ-9 Total Score -- -- 7 -- 3      Flowsheet  Row Video Visit from [facility]/23/2022 in [facility] Video Visit from 04/19/2022 in [facility] Video Visit from [facility]/16/2022 [facility] RISK CATEGORY No Risk No Risk No Risk        Assessment and Plan: Patient is an 21 year old female with a history of depression anxiety possible bipolar disorder sleep difficulties and ADD.  For now she is doing well on her current regimen.  She will continue Strattera 60 mg daily for focus, hydroxyzine 25 to 50 mg at bedtime for sleep, clonazepam 0.5 mg to 1 mg daily as needed for anxiety, Vraylar 1.5 mg daily for mood stabilization, Zoloft 75 mg daily for depression and anxiety and BuSpar 10 mg 3 times daily for anxiety.  She will return to see me in 3  months  Collaboration of Care: Collaboration of Care: Primary Care Provider AEB notes are available to PCP through the epic system  Patient/Guardian was advised Release of Information must be obtained prior to any record release in order to collaborate their care with an outside provider. Patient/Guardian was advised if they have not already done so to contact the registration department to sign all necessary forms in order for [place] to release information regarding their care.   Consent: Patient/Guardian gives verbal consent for treatment and assignment of benefits for services provided during this visit. Patient/Guardian expressed understanding and agreed to proceed.    [name], MD 06/23/2022, 10:23 AM

## 2022-06-27 NOTE — Telephone Encounter (Signed)
Spoke with mom, she would like to get the Covid Vaccine in [place]. I informed her that I would reach out when we schedule the next Covid clinic

## 2022-07-06 ENCOUNTER — Ambulatory Visit: Payer: Medicaid Other | Admitting: Family Medicine

## 2022-07-11 ENCOUNTER — Encounter: Payer: Self-pay | Admitting: Family Medicine

## 2022-07-11 ENCOUNTER — Ambulatory Visit (INDEPENDENT_AMBULATORY_CARE_PROVIDER_SITE_OTHER): Payer: Medicaid Other | Admitting: Family Medicine

## 2022-07-11 VITALS — BP 129/88 | HR 140 | Temp 96.3°F | Resp 20 | Ht 59.01 in | Wt 233.0 lb

## 2022-07-11 DIAGNOSIS — I10 Essential (primary) hypertension: Secondary | ICD-10-CM

## 2022-07-11 DIAGNOSIS — R000 Tachycardia, unspecified: Secondary | ICD-10-CM | POA: Diagnosis not present

## 2022-07-11 DIAGNOSIS — Z8774 Personal history of (corrected) congenital malformations of heart and circulatory system: Secondary | ICD-10-CM | POA: Diagnosis not present

## 2022-07-11 NOTE — Progress Notes (Signed)
Assessment & Plan:  1. Primary hypertension - Ambulatory referral to Cardiology  2. History of atrial septal defect repair - Ambulatory referral to Cardiology  3. Tachycardia - Ambulatory referral to Cardiology   Return in about 3 months (around 10/11/2022) for follow-up of chronic medication conditions with [facility].  Deliah Boston, MSN, APRN, FNP-C [facility]  Subjective:    Patient ID: Molly [name], female    DOB: [birth year], 21 y.o.   MRN: [id number]  Patient Care Team: Gwenlyn Fudge, FNP as PCP - General (Family Medicine) Dellis Anes Hetty Ely, MD as Consulting Physician (Allergy and Immunology) Myrlene Broker, MD as Consulting Physician [facility]) Shaune Pollack, MD as Consulting Physician (Neurology) Alen Bleacher, Winferd Humphrey, MD as Consulting Physician (Pediatric Gastroenterology)   Chief Complaint:  Chief Complaint  Patient presents with   Medical Management of Chronic Issues    3 mo     HPI: Molly [name] is a 21 y.o. female presenting on 07/11/2022 for Medical Management of Chronic Issues (3 mo )  Patient is accompanied by her mother, who she is okay with being present.  Taking a B12 supplement. Level was normal in May, three months ago.  Patient is established with neurologist, Dr. Donneta Romberg, who manages her migraines. Last seen on 06/21/2022.  She is established with pediatric gastroenterologist, Dr. Alen Bleacher, who manages her recurrent vomiting, reflux, and constipation. Upcoming appointment on 07/19/2022.  She is established with psychiatrist, Dr. Tenny Craw, who manages her ADHD and depression. Last seen on 06/23/2022.  She is established with asthma and allergist, Dr. Dellis Anes, who manages her asthma, allergies, intrinsic atopic dermatitis, and food allergies. Upcoming appointment on 08/11/2022.  She was established with pediatric cardiologist, Dr. Mayford Knife, who managed her hypertension and postclosure of atrial  septal defect.  She was previously supposed to follow-up with them regarding a possible diagnosis of POTS, but it appears this did not occur as they wanted her to establish with an adult cardiologist. Mom states she was referred by her neurologist, but they are sending her all the way to [facility]. They would like something closer to home.   She is established with pediatric ENT who manages her dermatitis of her ear canal, snoring, and sleep disordered breathing.  Next appointment on 09/13/2022.  She is established with an OB/GYN for her birth control.  New Complaints: None   Social history:  Relevant past medical, surgical, family and social history reviewed and updated as indicated. Interim medical history since our last visit reviewed.  Allergies and medications reviewed and updated.  DATA REVIEWED: CHART IN EPIC  ROS: Negative unless specifically indicated above in HPI.    Current Outpatient Medications:    albuterol (PROVENTIL) (2.5 MG/3ML) 0.083% nebulizer solution, Take 3 mLs (2.5 mg total) by nebulization every 6 (six) hours as needed for wheezing or shortness of breath., Disp: 75 mL, Rfl: 0   ascorbic acid (VITAMIN C) 500 MG tablet, Take 500 mg by mouth daily., Disp: , Rfl:    atomoxetine (STRATTERA) 60 MG capsule, Take 1 capsule (60 mg total) by mouth daily., Disp: 30 capsule, Rfl: 2   Azelastine-Fluticasone 137-50 MCG/ACT SUSP, Place 1 spray into both nostrils 2 (two) times daily., Disp: 23 g, Rfl: 2   budesonide (PULMICORT) 0.5 MG/2ML nebulizer solution, TAKE 2 MLS (0.5 MG TOTAL) BY NEBULIZATION 2 (TWO) TIMES DAILY AS NEEDED., Disp: 120 mL, Rfl: 1   budesonide-formoterol (SYMBICORT) 160-4.5 MCG/ACT inhaler, INHALE 2 PUFFS TWICE DAILY TO PREVENT COUGHING OR WHEEZING.,  Disp: 10.2 each, Rfl: 5   busPIRone (BUSPAR) 10 MG tablet, Take 1 tablet (10 mg total) by mouth 3 (three) times daily., Disp: 90 tablet, Rfl: 2   calcium carbonate (OS-CAL) 1250 (500 Ca) MG chewable tablet, Chew  by mouth., Disp: , Rfl:    cariprazine (VRAYLAR) 1.5 MG capsule, Take 1 capsule (1.5 mg total) by mouth daily., Disp: 30 capsule, Rfl: 2   Cholecalciferol 25 MCG (1000 UT) tablet, Take 1 Units by mouth daily at 2 PM., Disp: , Rfl:    clobetasol (TEMOVATE) 0.05 % external solution, APPLY TO AFFECTED AREA TWICE A DAY, Disp: 50 mL, Rfl: 3   clonazePAM (KLONOPIN) 0.5 MG tablet, Take 1 tablet (0.5 mg total) by mouth 2 (two) times daily., Disp: 60 tablet, Rfl: 2   fexofenadine (ALLEGRA) 180 MG tablet, TAKE 1 TABLET BY MOUTH EVERY DAY, Disp: 30 tablet, Rfl: 5   Fluocinolone Acetonide 0.01 % OIL, Place in ear(s)., Disp: , Rfl:    fluticasone (FLOVENT HFA) 220 MCG/ACT inhaler, DURING ASTHMA FLARES INHALE 2 PUFFS TWICE DAILY FOR TWO WEEKS THEN STOP., Disp: 12 each, Rfl: 3   Galcanezumab-gnlm 120 MG/ML SOAJ, Inject into the skin., Disp: , Rfl:    hydrOXYzine (VISTARIL) 25 MG capsule, Take 1-2 capsules (25-50 mg total) by mouth daily as needed., Disp: 60 capsule, Rfl: 2   hyoscyamine (LEVSIN SL) 0.125 MG SL tablet, Take by mouth., Disp: , Rfl:    metoprolol succinate (TOPROL-XL) 50 MG 24 hr tablet, Take 1 tablet by mouth daily., Disp: , Rfl:    montelukast (SINGULAIR) 10 MG tablet, Take 1 tablet (10 mg total) by mouth daily., Disp: 30 tablet, Rfl: 5   Multiple Vitamin (MULTIVITAMIN) capsule, Take by mouth., Disp: , Rfl:    norethindrone (AYGESTIN) 5 MG tablet, Take 5 mg by mouth daily., Disp: , Rfl:    nystatin (MYCOSTATIN/NYSTOP) powder, Apply 1 application topically 3 (three) times daily., Disp: 60 g, Rfl: 3   Olopatadine HCl 0.2 % SOLN, Apply 1 drop to eye every morning., Disp: , Rfl:    ondansetron (ZOFRAN-ODT) 4 MG disintegrating tablet, DISSOLVE ON TONGUE AT ONSET OF NAUSEA- 30 DAY SUPPLY, Disp: 10 tablet, Rfl: 1   pantoprazole (PROTONIX) 40 MG tablet, Take 1 tablet by mouth in the morning and at bedtime., Disp: , Rfl:    Polyethylene Glycol 3350 (PEG 3350) POWD, 1 capfull (17g) mixed with 8 oz of clear  liquid PO daily.  Adjust dose as needed to achieve soft stool daily., Disp: , Rfl:    PROAIR HFA 108 (90 Base) MCG/ACT inhaler, Inhale 2 puffs into the lungs every 4 (four) hours as needed for wheezing or shortness of breath., Disp: 18 g, Rfl: 1   sertraline (ZOLOFT) 50 MG tablet, Take 1.5 tablets (75 mg total) by mouth daily., Disp: 45 tablet, Rfl: 2   SUMAtriptan (IMITREX) 50 MG tablet, 1 tablet at onset of migraine. Repeat in 2 hours of needed. Limit to 2 doses/day, 2 times per week., Disp: , Rfl:    zinc gluconate 50 MG tablet, Take 50 mg by mouth daily., Disp: , Rfl:    EPINEPHrine 0.3 mg/0.3 mL IJ SOAJ injection, Inject 0.3 mg into the muscle as needed for anaphylaxis. (Patient not taking: Reported on 07/11/2022), Disp: 2 each, Rfl: 2   Allergies  Allergen Reactions   Glucosamine Forte [Nutritional Supplements] Anaphylaxis    Due to containing shellfish   Nutritional Supplements Anaphylaxis   Oyster Extract Anaphylaxis   Shellfish-Derived Products Anaphylaxis  Throat swelling   Cats Claw (Uncaria Tomentosa) Nausea And Vomiting   Diamox [Acetazolamide] Other (See Comments)    Numbness- feet, hands, lips   Lactose    Lactose Intolerance (Gi)    Topiramate Other (See Comments)    Numbness to face, hands and feet   Bromfed Dm [Pseudoeph-Bromphen-Dm] Anxiety    CARBOFED DM   Past Medical History:  Diagnosis Date   Acid reflux 2010   Acne vulgaris 2017   ADHD (attention deficit hyperactivity disorder) 2011   Adverse food reaction 07/11/2016   Shellfish allergy   Amenorrhea 2019   Anaphylactic shock due to adverse food reaction 01/15/2018   Anxiety 2017   Chronic constipation 09/05/2011   Chronic tension headaches 2011   originally followed by [facility] K.Griffin. Then Dr Alexis Goodell [facility] 03/2020 (see above)    Coalition, calcaneus navicular    Dysmenorrhea    Eczema    Eustachian tube dysfunction, left 10/23/2019   Frequent headaches    Galactorrhea    Gastritis  determined by biopsy 04/01/2019   Hiatal hernia    History of atrial septal defect repair 12/04/2011   Hyperlipidemia 2013   Hypertension 2011   Hypertension 09/28/2020   hypertension, cellulitus    Idiopathic urticaria 09/05/2011   Insomnia    Keratosis pilaris 01/15/2018   Mass of left thigh 11/22/2017   Overview:  Added automatically from request for surgery [id number]   MDD (major depressive disorder), severe (HCC) 03/06/2018   Migraine 2011   re-diagnosed as chronic migraines 03/2020 [facility] - starting on preventative    Obesity without serious comorbidity with body mass index (BMI) in 95th to 98th percentile for age in pediatric patient 09/04/2018   Piriformis syndrome    POTS (postural orthostatic tachycardia syndrome)    PTSD (post-traumatic stress disorder)    Scoliosis 2019   Seasonal and perennial allergic rhinitis 2010   Severe persistent asthma without complication 2010   Sleep paralysis    Suicidal ideation 03/07/2018   Tic disorder 2019    Past Surgical History:  Procedure Laterality Date   ASD REPAIR  2012   with Helix, via Cardiac Catheterization   atrial septal defecgt     CARDIAC SURGERY     DENTAL SURGERY  2020   lipoma removal  2019   LUMBAR PUNCTURE  05/20/2021   TONSILLECTOMY AND ADENOIDECTOMY  2010    Social History   Socioeconomic History   Marital status: Single    Spouse name: Not on file   Number of children: Not on file   Years of education: Not on file   Highest education level: Not on file  Occupational History   Not on file  Tobacco Use   Smoking status: Never   Smokeless tobacco: Never  Vaping Use   Vaping Use: Never used  Substance and Sexual Activity   Alcohol use: No    Alcohol/week: 0.0 standard drinks of alcohol   Drug use: No   Sexual activity: Yes  Other Topics Concern   Not on file  Social History Narrative   Not on file   Social Determinants of Health   Financial Resource Strain: Not on file  Food Insecurity: Not  on file  Transportation Needs: Not on file  Physical Activity: Not on file  Stress: Not on file  Social Connections: Not on file  Intimate Partner Violence: Not on file        Objective:    Ht 4' 11.01" (1.499 m)  Wt 233 lb (105.7 kg)   LMP  (Approximate) Comment: medically stopped with BCP  BMI 47.04 kg/m   Wt Readings from Last 3 Encounters:  07/11/22 233 lb (105.7 kg) (>99 %, Z= 2.33)*  04/04/22 233 lb 6.4 oz (105.9 kg) (>99 %, Z= 2.33)*  02/23/22 230 lb 4 oz (104.4 kg) (99 %, Z= 2.30)*   * Growth percentiles are based on CDC (Girls, 2-20 Years) data.    Physical Exam Vitals reviewed.  Constitutional:      General: She is not in acute distress.    Appearance: Normal appearance. She is morbidly obese. She is not ill-appearing, toxic-appearing or diaphoretic.  HENT:     Head: Normocephalic and atraumatic.  Eyes:     General: No scleral icterus.       Right eye: No discharge.        Left eye: No discharge.     Conjunctiva/sclera: Conjunctivae normal.  Cardiovascular:     Rate and Rhythm: Regular rhythm. Tachycardia present.     Heart sounds: Normal heart sounds. No murmur heard.    No friction rub. No gallop.  Pulmonary:     Effort: Pulmonary effort is normal. No respiratory distress.     Breath sounds: Normal breath sounds. No stridor. No wheezing, rhonchi or rales.  Musculoskeletal:        General: Normal range of motion.     Cervical back: Normal range of motion.  Skin:    General: Skin is warm and dry.     Capillary Refill: Capillary refill takes less than 2 seconds.  Neurological:     General: No focal deficit present.     Mental Status: She is alert and oriented to person, place, and time. Mental status is at baseline.  Psychiatric:        Mood and Affect: Mood normal.        Behavior: Behavior normal.        Thought Content: Thought content normal.        Judgment: Judgment normal.     Lab Results  Component Value Date   TSH 1.550 03/06/2022    Lab Results  Component Value Date   WBC 9.4 04/04/2022   HGB 15.1 04/04/2022   HCT 43.4 04/04/2022   MCV 91 04/04/2022   PLT 287 04/04/2022   Lab Results  Component Value Date   NA 143 03/06/2022   K 4.5 03/06/2022   CO2 24 03/06/2022   GLUCOSE 83 03/06/2022   BUN 12 03/06/2022   CREATININE 0.77 03/06/2022   BILITOT 0.3 03/06/2022   ALKPHOS 68 03/06/2022   AST 17 03/06/2022   ALT 26 03/06/2022   PROT 6.5 03/06/2022   ALBUMIN 4.3 03/06/2022   CALCIUM 9.9 03/06/2022   ANIONGAP 9 04/05/2018   Lab Results  Component Value Date   CHOL 192 (H) 03/06/2022   Lab Results  Component Value Date   HDL 39 (L) 03/06/2022   Lab Results  Component Value Date   LDLCALC 138 (H) 03/06/2022   Lab Results  Component Value Date   TRIG 84 03/06/2022   Lab Results  Component Value Date   CHOLHDL 4.9 (H) 03/06/2022   Lab Results  Component Value Date   HGBA1C 5.2 03/06/2022

## 2022-07-26 ENCOUNTER — Ambulatory Visit (INDEPENDENT_AMBULATORY_CARE_PROVIDER_SITE_OTHER): Payer: Medicaid Other | Admitting: Family Medicine

## 2022-07-26 ENCOUNTER — Other Ambulatory Visit: Payer: Self-pay | Admitting: Allergy & Immunology

## 2022-07-26 ENCOUNTER — Other Ambulatory Visit: Payer: Self-pay | Admitting: Pediatrics

## 2022-07-26 ENCOUNTER — Encounter: Payer: Self-pay | Admitting: Family Medicine

## 2022-07-26 VITALS — BP 116/75 | HR 96 | Temp 96.9°F | Ht 59.0 in | Wt 237.1 lb

## 2022-07-26 DIAGNOSIS — B372 Candidiasis of skin and nail: Secondary | ICD-10-CM

## 2022-07-26 MED ORDER — FLUCONAZOLE 150 MG PO TABS: 150.0000 mg | ORAL_TABLET | ORAL | 0 refills | Status: DC

## 2022-07-26 MED ORDER — NYSTATIN 100000 UNIT/GM EX CREA: 1.0000 | TOPICAL_CREAM | Freq: Two times a day (BID) | CUTANEOUS | 1 refills | Status: DC

## 2022-07-26 NOTE — Patient Instructions (Signed)
Skin Yeast Infection  A skin yeast infection is a condition in which there is an overgrowth of yeast (Candida) that normally lives on the skin. This condition usually occurs in areas of the skin that are constantly warm and moist, such as the skin under the breasts or armpits, or in the groin and other body folds. What are the causes? This condition is caused by a change in the normal balance of the yeast that live on the skin. What increases the risk? You are more likely to develop this condition if you: Are obese. Are pregnant. Are 65 years of age or older. Wear tight clothing. Have any of the following conditions: Diabetes. Malnutrition. A weak body defense system (immune system). Take medicines such as: Birth control pills. Antibiotics. Steroid medicines. What are the signs or symptoms? The most common symptom of this condition is itchiness in the affected area. Other symptoms include: A red, swollen area of the skin. Bumps on the skin. How is this diagnosed? This condition is diagnosed with a medical history and physical exam. Your health care provider may check for yeast by taking scrapings of the skin to be viewed under a microscope. How is this treated? This condition is treated with medicine. Medicines may be prescribed or available over the counter. The medicines may be: Taken by mouth (orally). Applied as a cream or powder to your skin. Follow these instructions at home:  Take or apply over-the-counter and prescription medicines only as told by your health care provider. Maintain a healthy weight. If you need help losing weight, talk with your health care provider. Keep your skin clean and dry. Wear loose-fitting clothing. If you have diabetes, keep your blood sugar under control. Keep all follow-up visits. This is important. Contact a health care provider if: Your symptoms go away and then come back. Your symptoms do not get better with treatment. Your symptoms get  worse. Your rash spreads. You have a fever or chills. You have new symptoms. You have new warmth or redness of your skin. Your rash is painful or bleeding. Summary A skin yeast infection is a condition in which there is an overgrowth of yeast (Candida) that normally lives on the skin. Take or apply over-the-counter and prescription medicines only as told by your health care provider. Keep your skin clean and dry. Contact a health care provider if your symptoms do not get better with treatment. This information is not intended to replace advice given to you by your health care provider. Make sure you discuss any questions you have with your health care provider. Document Revised: 02/08/2021 Document Reviewed: 02/08/2021 Elsevier Patient Education  2023 Elsevier Inc.  

## 2022-07-26 NOTE — Progress Notes (Signed)
   Acute Office Visit  Subjective:     Patient ID: Molly [name], female    DOB: 02/21/2001, 21 y.o.   MRN: [id number]  Chief Complaint  Patient presents with   Rash    Rash This is a new problem. The current episode started in the past 7 days. The problem has been gradually worsening since onset. Location: bilateral groin. The rash is characterized by burning, redness, pain and itchiness. She was exposed to nothing. Pertinent negatives include no fever. Treatments tried: nystatin. The treatment provided mild relief.     Review of Systems  Constitutional:  Negative for chills and fever.  Genitourinary:  Negative for dysuria, frequency, hematuria and urgency.  Skin:  Positive for itching and rash.        Objective:    BP 116/75   Pulse 96   Temp (!) 96.9 F (36.1 C) (Temporal)   Ht 4\' 11"  (1.499 m)   Wt 237 lb 2 oz (107.6 kg)   SpO2 97%   BMI 47.89 kg/m    Physical Exam Vitals and nursing note reviewed.  Constitutional:      General: She is not in acute distress.    Appearance: She is not ill-appearing, toxic-appearing or diaphoretic.  Pulmonary:     Effort: Pulmonary effort is normal. No respiratory distress.  Musculoskeletal:     Right lower leg: No edema.     Left lower leg: No edema.  Skin:    General: Skin is warm.     Findings: Rash (large area of erythematous rash to bilateral groing and lower abdominal skin flold. No skin breakdown or exudate present.) present.  Neurological:     General: No focal deficit present.     Mental Status: She is alert and oriented to person, place, and time.  Psychiatric:        Mood and Affect: Mood normal.        Behavior: Behavior normal.     No results found for any visits on 07/26/22.      Assessment & Plan:   [name] was seen today for rash.  Diagnoses and all orders for this visit:  Yeast dermatitis Diflucan weekly x 4 weeks. Refilled nystatin cream as well. Discussed keep area clean and dry as  possible. Wear loose fitting clothing. Return to office for new or worsening symptoms, or if symptoms persist.  -     fluconazole (DIFLUCAN) 150 MG tablet; Take 1 tablet (150 mg total) by mouth once a week. -     nystatin cream (MYCOSTATIN); Apply 1 Application topically 2 (two) times daily.  The patient indicates understanding of these issues and agrees with the plan.  Natalia Leatherwood, FNP

## 2022-07-30 ENCOUNTER — Encounter: Payer: Self-pay | Admitting: Family Medicine

## 2022-07-30 DIAGNOSIS — B372 Candidiasis of skin and nail: Secondary | ICD-10-CM

## 2022-07-31 ENCOUNTER — Other Ambulatory Visit: Payer: Self-pay | Admitting: Family Medicine

## 2022-07-31 DIAGNOSIS — B372 Candidiasis of skin and nail: Secondary | ICD-10-CM

## 2022-07-31 MED ORDER — NYSTATIN 100000 UNIT/GM EX CREA: 1.0000 | TOPICAL_CREAM | Freq: Two times a day (BID) | CUTANEOUS | 0 refills | Status: DC

## 2022-08-01 MED ORDER — NYSTATIN 100000 UNIT/GM EX CREA: 1.0000 | TOPICAL_CREAM | Freq: Four times a day (QID) | CUTANEOUS | 1 refills | Status: DC

## 2022-08-09 ENCOUNTER — Telehealth: Payer: Self-pay | Admitting: *Deleted

## 2022-08-09 NOTE — Telephone Encounter (Signed)
PA has been submitted through [employer] for azelastine-fluticasone and has been approved. Approval has been faxed to patients pharmacy, labeled, and placed in bulk scanning.

## 2022-08-11 ENCOUNTER — Ambulatory Visit (INDEPENDENT_AMBULATORY_CARE_PROVIDER_SITE_OTHER): Payer: Medicaid Other | Admitting: Allergy & Immunology

## 2022-08-11 ENCOUNTER — Encounter: Payer: Self-pay | Admitting: Allergy & Immunology

## 2022-08-11 VITALS — BP 138/86 | HR 126 | Temp 97.3°F | Resp 16 | Ht 59.0 in | Wt 240.2 lb

## 2022-08-11 DIAGNOSIS — T7800XD Anaphylactic reaction due to unspecified food, subsequent encounter: Secondary | ICD-10-CM | POA: Diagnosis not present

## 2022-08-11 DIAGNOSIS — J3089 Other allergic rhinitis: Secondary | ICD-10-CM

## 2022-08-11 DIAGNOSIS — J4550 Severe persistent asthma, uncomplicated: Secondary | ICD-10-CM

## 2022-08-11 DIAGNOSIS — J302 Other seasonal allergic rhinitis: Secondary | ICD-10-CM

## 2022-08-11 MED ORDER — VENTOLIN HFA 108 (90 BASE) MCG/ACT IN AERS: 2.0000 | INHALATION_SPRAY | RESPIRATORY_TRACT | 1 refills | Status: DC | PRN

## 2022-08-11 MED ORDER — FEXOFENADINE HCL 180 MG PO TABS: 180.0000 mg | ORAL_TABLET | Freq: Every day | ORAL | 5 refills | Status: DC

## 2022-08-11 MED ORDER — AZELASTINE-FLUTICASONE 137-50 MCG/ACT NA SUSP: 1.0000 | Freq: Two times a day (BID) | NASAL | 5 refills | Status: DC

## 2022-08-11 MED ORDER — ALBUTEROL SULFATE (2.5 MG/3ML) 0.083% IN NEBU: 2.5000 mg | INHALATION_SOLUTION | Freq: Four times a day (QID) | RESPIRATORY_TRACT | 1 refills | Status: AC | PRN

## 2022-08-11 MED ORDER — MONTELUKAST SODIUM 10 MG PO TABS: 10.0000 mg | ORAL_TABLET | Freq: Every day | ORAL | 5 refills | Status: DC

## 2022-08-11 MED ORDER — BUDESONIDE-FORMOTEROL FUMARATE 160-4.5 MCG/ACT IN AERO: 2.0000 | INHALATION_SPRAY | Freq: Two times a day (BID) | RESPIRATORY_TRACT | 5 refills | Status: DC

## 2022-08-11 NOTE — Patient Instructions (Addendum)
1. Intrinsic atopic dermatitis  - Continue with the triamcinolone ointment as needed.  - Continue with moisturizing twice daily.   2. Moderate persistent asthma, uncomplicated - Lung testing looked AMAZING today!  - We will change to the SMART therapy. - Daily controller medication(s): Symbicort 160/4.5 mcg one puff once daily - Rescue medications: Symbicort 160/4.37mcg two puffs every 4-6 hours as needed - Asthma control goals:  * Full participation in all desired activities (may need albuterol before activity) * Albuterol use two time or less a week on average (not counting use with activity) * Cough interfering with sleep two time or less a month * Oral steroids no more than once a year * No hospitalizations  3. Chronic allergic rhinitis (indoor molds, outdoor molds, dust mites and cat) - Your nose looks great today!  - Continue with: Singulair (montelukast) 10mg  daily and Dymista (fluticasone/azelastine) two sprays per nostril 1-2 times daily as needed  - Consider nasal saline rinses 1-2 times daily to remove allergens from the nasal cavities as well as help with mucous clearance (this is especially helpful to do before the nasal sprays are given) - We are going to get some repeat environmental allergy testing via the blood to see if she has developed new sensitizations.   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   5. Return in about 3 months (around 11/10/2022).     Please inform 14/07/2022 of any Emergency Department visits, hospitalizations, or changes in symptoms. Call us before going to the ED for breathing or allergy symptoms since we might be able to fit you in for a sick visit. Feel free to contact us anytime with any questions, problems, or concerns.  It was a pleasure to see you again today!  Websites that have reliable patient information: 1. American Academy of Asthma, Allergy, and Immunology: www.aaaai.org 2. Food Allergy Research and Education  (FARE): foodallergy.org 3. Mothers of Asthmatics: http://www.asthmacommunitynetwork.org 4. American College of Allergy, Asthma, and Immunology: www.acaai.org   COVID-19 Vaccine Information can be found at: [place] For questions related to vaccine distribution or appointments, please email [email or web address] or call [phone].   We realize that you might be concerned about having an allergic reaction to the COVID19 vaccines. To help with that concern, WE ARE OFFERING THE COVID19 VACCINES IN OUR OFFICE! Ask the front desk for dates!     "Like" [phone] on Facebook and Instagram for our latest updates!      A healthy democracy works best when [place] participate! Make sure you are registered to vote! If you have moved or changed any of your contact information, you will need to get this updated before voting!  In some cases, you MAY be able to register to vote online: [employer]

## 2022-08-11 NOTE — Progress Notes (Signed)
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 [name]    Plan/Recommendations:    There are no Patient Instructions on file for this visit.   Subjective:   Molly [name] is a 21 y.o. female presenting today for follow up of  Chief Complaint  Patient presents with  . Asthma    SOB from pots or asthma. Once her HR goes up starts sweating, weak, SOB.  She was sick in May and was sick through June.   . Allergic Rhinitis     Sneezing, watery eyes,scratchy/sore throat.   . keratosis    Under her underarms she has bumps(red)     Molly [name] has a history of the following: Patient Active Problem List   Diagnosis Date Noted  . Piriformis syndrome 08/11/2021  . 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 Insomnia 06/10/2020  . Sleep paralysis 06/10/2020  . Dysmenorrhea 03/23/2020  . Menorrhagia with irregular cycle 03/23/2020  . Fatigue 03/23/2020  . ADHD (attention deficit hyperactivity disorder) 03/09/2020  . Unexplained dysphagia 01/21/2020  . Anxiety 10/23/2019  . Eustachian tube dysfunction, left 10/23/2019  . 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 04/01/2019  . Obesity without serious comorbidity with body mass index (BMI) in 95th to 98th percentile for age in pediatric patient 09/04/2018  . PTSD (post-traumatic stress disorder) 03/07/2018  . MDD (major depressive disorder), severe (HCC) 03/06/2018  . Keratosis pilaris 01/15/2018   . Severe persistent asthma, uncomplicated 01/15/2018  . Intrinsic atopic dermatitis 01/15/2018  . Seasonal and perennial allergic rhinitis 01/15/2018  . 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 Tic disorder 2019  . Scoliosis 2019  . Adverse food reaction 07/11/2016  . Chronic migraine w/o aura w/o status migrainosus, not intractable 05/07/2012  . Hyperlipidemia 2013  . History of atrial septal defect repair 12/04/2011  . Idiopathic urticaria 09/05/2011  . Chronic constipation 09/05/2011  . Hypertension 2011    History obtained from: chart review and {Persons; PED relatives w/patient:19415::"patient"}.  Molly [name] is a 21 y.o. female presenting for {Blank single:19197::"a food challenge","a drug challenge","skin testing","a sick visit","an evaluation of ***","a follow up visit"}.  She was last seen in March 2023.  At that time, we continue with the triamcinolone as needed for her skin as well as moisturizing twice daily.  For her asthma, her lung testing looked great.  We continued the Symbicort 160 mcg 2 puffs twice daily as well as Singulair daily.  She has Flovent that she has during respiratory flares.  For her allergic rhinitis, we continue with Singulair as well as Dymista.  She continue to avoid fish and blueberry.  Since last visit,  {Blank single:19197::"Asthma/Respiratory Symptom History: ***"," "}  {Blank single:19197::"Allergic Rhinitis Symptom History: ***"," "}  {Blank single:19197::"Food Allergy Symptom History: ***"," "}  {Blank single:19197::"Skin Symptom History: ***"," "}  {Blank single:19197::"GERD Symptom History: ***"," "}  She has POTS by Dr. Mort Sawyers. She is going to be seeing someone... , which was diagnosed with. She is establishing care  on October 31st. Dr. Graciela Husbands. She apparently was started on metoprolol by Dr. Mayford Knife, the cardiologist that was not comfortable managing the POTS. She is still having the racing heart and whatnot now. She  is out of breath and her heart rate goes up.   She graduated in June 2023.  She is going attending [employer], based in [place]. This is going fairly well and she is doing well thus far. 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 She wants to try this out first and try to get her POTs under control and then get her RN.     Otherwise, there have been no changes to her past medical history, surgical history, family history, or social history.    ROS     Objective:   There were no vitals taken for this visit. There is no height or weight on file to calculate BMI.    Physical Exam   Diagnostic studies: {Blank single:19197::"none","deferred due to recent antihistamine use","labs sent instead"," "}  Spirometry: results normal (FEV1: 2.48/92%, FVC: 3.03/100%, FEV1/FVC: 82%).    Spirometry consistent with normal pattern. {Blank single:19197::"Albuterol/Atrovent nebulizer","Xopenex/Atrovent nebulizer","Albuterol nebulizer","Albuterol four puffs via MDI","Xopenex four puffs via MDI"} treatment given in clinic with {Blank single:19197::"significant improvement in FEV1 per ATS criteria","significant improvement in FVC per ATS criteria","significant improvement in FEV1 and FVC per ATS criteria","improvement in FEV1, but not significant per ATS criteria","improvement in FVC, but not significant per ATS criteria","improvement in FEV1 and FVC, but not significant per ATS criteria","no improvement"}.  Allergy Studies: {Blank single:19197::"none","labs sent instead"," "}    {Blank single:19197::"Allergy testing results were read and interpreted by myself, documented by clinical staff."," "}      Malachi Bonds, MD  [facility]

## 2022-08-13 ENCOUNTER — Encounter: Payer: Self-pay | Admitting: Allergy & Immunology

## 2022-08-15 ENCOUNTER — Encounter: Payer: Self-pay | Admitting: Family Medicine

## 2022-08-15 ENCOUNTER — Ambulatory Visit (INDEPENDENT_AMBULATORY_CARE_PROVIDER_SITE_OTHER): Payer: Medicaid Other | Admitting: Family Medicine

## 2022-08-15 VITALS — BP 131/84 | HR 102 | Temp 97.0°F | Resp 20 | Ht 59.0 in | Wt 241.0 lb

## 2022-08-15 DIAGNOSIS — M2550 Pain in unspecified joint: Secondary | ICD-10-CM

## 2022-08-15 DIAGNOSIS — M5410 Radiculopathy, site unspecified: Secondary | ICD-10-CM | POA: Diagnosis not present

## 2022-08-15 MED ORDER — METHOCARBAMOL 500 MG PO TABS: 500.0000 mg | ORAL_TABLET | Freq: Three times a day (TID) | ORAL | 0 refills | Status: DC | PRN

## 2022-08-15 MED ORDER — PREDNISONE 20 MG PO TABS: 40.0000 mg | ORAL_TABLET | Freq: Every day | ORAL | 0 refills | Status: AC

## 2022-08-15 NOTE — Progress Notes (Signed)
Assessment & Plan:  1. Radiculopathy affecting upper extremity Education provided on radicular pain. - predniSONE (DELTASONE) 20 MG tablet; Take 2 tablets (40 mg total) by mouth daily with breakfast for 5 days.  Dispense: 10 tablet; Refill: 0 - methocarbamol (ROBAXIN) 500 MG tablet; Take 1 tablet (500 mg total) by mouth every 8 (eight) hours as needed for muscle spasms.  Dispense: 30 tablet; Refill: 0  2. Polyarthralgia - Rheumatoid Arthritis Profile   Follow up plan: Return if symptoms worsen or fail to improve.  Deliah Boston, MSN, APRN, FNP-C [facility]  Subjective:   Patient ID: AZLIN ZILBERMAN, female    DOB: 2001-01-23, 21 y.o.   MRN: [id number]  HPI: LUCILLIE KIESEL is a 21 y.o. female presenting on 08/15/2022 for Fall (Last month - left shoulder pain and left wrist pain since then )  Patient is accompanied by her mother and brother, who she is okay with being present.  Shoulder Pain: Patient complaints of left shoulder and wrist pain. This is evaluated as a personal injury. The pain is described as aching, sharp, throbbing, and deep .  The onset of the pain was  2-3 weeks ago ; her fall was a month ago.  The pain occurs continuously.  Location is posterior. No history of dislocation. Symptoms are aggravated by gripping/squeezing. Symptoms are diminished by ice.   Limited activities include: all activities.   Patient also reports generalized joint pains that she feels are unusual for her age.   ROS: Negative unless specifically indicated above in HPI.   Relevant past medical history reviewed and updated as indicated.   Allergies and medications reviewed and updated.   Current Outpatient Medications:    albuterol (PROVENTIL) (2.5 MG/3ML) 0.083% nebulizer solution, Take 3 mLs (2.5 mg total) by nebulization every 6 (six) hours as needed for wheezing or shortness of breath., Disp: 75 mL, Rfl: 1   ascorbic acid (VITAMIN C) 500 MG tablet, Take  500 mg by mouth daily., Disp: , Rfl:    atomoxetine (STRATTERA) 60 MG capsule, Take 1 capsule (60 mg total) by mouth daily., Disp: 30 capsule, Rfl: 2   Azelastine-Fluticasone 137-50 MCG/ACT SUSP, Place 1 spray into both nostrils 2 (two) times daily., Disp: 23 g, Rfl: 5   budesonide-formoterol (SYMBICORT) 160-4.5 MCG/ACT inhaler, Inhale 2 puffs into the lungs in the morning and at bedtime., Disp: 1 each, Rfl: 5   busPIRone (BUSPAR) 10 MG tablet, Take 1 tablet (10 mg total) by mouth 3 (three) times daily., Disp: 90 tablet, Rfl: 2   calcium carbonate (OS-CAL) 1250 (500 Ca) MG chewable tablet, Chew by mouth., Disp: , Rfl:    cariprazine (VRAYLAR) 1.5 MG capsule, Take 1 capsule (1.5 mg total) by mouth daily., Disp: 30 capsule, Rfl: 2   Cholecalciferol 25 MCG (1000 UT) tablet, Take 1 Units by mouth daily at 2 PM., Disp: , Rfl:    clobetasol (TEMOVATE) 0.05 % external solution, APPLY TO AFFECTED AREA TWICE A DAY, Disp: 50 mL, Rfl: 3   clonazePAM (KLONOPIN) 0.5 MG tablet, Take 1 tablet (0.5 mg total) by mouth 2 (two) times daily., Disp: 60 tablet, Rfl: 2   fexofenadine (ALLEGRA) 180 MG tablet, Take 1 tablet (180 mg total) by mouth daily., Disp: 30 tablet, Rfl: 5   Fluocinolone Acetonide 0.01 % OIL, Place in ear(s)., Disp: , Rfl:    Galcanezumab-gnlm 120 MG/ML SOAJ, Inject into the skin., Disp: , Rfl:    hydrOXYzine (VISTARIL) 25 MG capsule, Take 1-2 capsules (  25-50 mg total) by mouth daily as needed., Disp: 60 capsule, Rfl: 2   hyoscyamine (LEVSIN SL) 0.125 MG SL tablet, Take by mouth., Disp: , Rfl:    LINZESS 72 MCG capsule, Take 72 mcg by mouth daily., Disp: , Rfl:    metoprolol succinate (TOPROL-XL) 50 MG 24 hr tablet, Take 1 tablet by mouth daily., Disp: , Rfl:    montelukast (SINGULAIR) 10 MG tablet, Take 1 tablet (10 mg total) by mouth daily., Disp: 30 tablet, Rfl: 5   Multiple Vitamin (MULTIVITAMIN) capsule, Take by mouth., Disp: , Rfl:    norethindrone (AYGESTIN) 5 MG tablet, Take 5 mg by mouth  daily., Disp: , Rfl:    nystatin cream (MYCOSTATIN), Apply 1 Application topically in the morning, at noon, in the evening, and at bedtime., Disp: 60 g, Rfl: 1   Olopatadine HCl 0.2 % SOLN, Apply 1 drop to eye every morning., Disp: , Rfl:    ondansetron (ZOFRAN-ODT) 4 MG disintegrating tablet, DISSOLVE ON TONGUE AT ONSET OF NAUSEA- 30 DAY SUPPLY, Disp: 10 tablet, Rfl: 1   pantoprazole (PROTONIX) 40 MG tablet, Take 1 tablet by mouth in the morning and at bedtime., Disp: , Rfl:    sertraline (ZOLOFT) 50 MG tablet, Take 1.5 tablets (75 mg total) by mouth daily., Disp: 45 tablet, Rfl: 2   SUMAtriptan (IMITREX) 50 MG tablet, 1 tablet at onset of migraine. Repeat in 2 hours of needed. Limit to 2 doses/day, 2 times per week., Disp: , Rfl:    VENTOLIN HFA 108 (90 Base) MCG/ACT inhaler, Inhale 2 puffs into the lungs every 4 (four) hours as needed for wheezing or shortness of breath., Disp: 18 g, Rfl: 1   zinc gluconate 50 MG tablet, Take 50 mg by mouth daily., Disp: , Rfl:    EPINEPHrine 0.3 mg/0.3 mL IJ SOAJ injection, Inject 0.3 mg into the muscle as needed for anaphylaxis. (Patient not taking: Reported on 08/15/2022), Disp: 2 each, Rfl: 2  Allergies  Allergen Reactions   Glucosamine Forte [Nutritional Supplements] Anaphylaxis    Due to containing shellfish   Nutritional Supplements Anaphylaxis   Oyster Extract Anaphylaxis   Shellfish-Derived Products Anaphylaxis    Throat swelling   Cats Claw (Uncaria Tomentosa) Nausea And Vomiting   Diamox [Acetazolamide] Other (See Comments)    Numbness- feet, hands, lips   Lactose    Lactose Intolerance (Gi)    Topiramate Other (See Comments)    Numbness to face, hands and feet   Bromfed Dm [Pseudoeph-Bromphen-Dm] Anxiety    CARBOFED DM    Objective:   BP 131/84   Pulse (!) 102   Temp (!) 97 F (36.1 C)   Resp 20   Ht 4\' 11"  (1.499 m)   Wt 241 lb (109.3 kg)   SpO2 96%   BMI 48.68 kg/m    Physical Exam Vitals reviewed.  Constitutional:       General: She is not in acute distress.    Appearance: Normal appearance. She is not ill-appearing, toxic-appearing or diaphoretic.  HENT:     Head: Normocephalic and atraumatic.  Eyes:     General: No scleral icterus.       Right eye: No discharge.        Left eye: No discharge.     Conjunctiva/sclera: Conjunctivae normal.  Cardiovascular:     Rate and Rhythm: Normal rate.  Pulmonary:     Effort: Pulmonary effort is normal. No respiratory distress.  Musculoskeletal:     Left shoulder: Tenderness present.  No swelling, deformity, effusion, laceration, bony tenderness or crepitus. Decreased range of motion. Normal strength. Normal pulse.     Left wrist: No swelling, deformity, effusion, lacerations, tenderness, bony tenderness, snuff box tenderness or crepitus. Normal range of motion. Normal pulse.     Cervical back: Normal range of motion. Muscular tenderness present. No spinous process tenderness.     Comments: Positive Tinel and Phalen's of the left wrist.  Skin:    General: Skin is warm and dry.     Capillary Refill: Capillary refill takes less than 2 seconds.  Neurological:     General: No focal deficit present.     Mental Status: She is alert and oriented to person, place, and time. Mental status is at baseline.  Psychiatric:        Mood and Affect: Mood normal.        Behavior: Behavior normal.        Thought Content: Thought content normal.        Judgment: Judgment normal.

## 2022-08-16 LAB — RHEUMATOID ARTHRITIS PROFILE
Cyclic Citrullin Peptide Ab: <1 units (ref 0–19)
Rheumatoid fact SerPl-aCnc: <10 IU/mL (ref <14.0)

## 2022-08-20 LAB — ALLERGENS W/COMP RFLX AREA 2
Alternaria Alternata IgE: <0.1 kU/L
Aspergillus Fumigatus IgE: <0.1 kU/L
Bermuda Grass IgE: <0.1 kU/L
Cedar, Mountain IgE: <0.1 kU/L
Cladosporium Herbarum IgE: <0.1 kU/L
Cockroach, German IgE: <0.1 kU/L
Common Silver Birch IgE: <0.1 kU/L
Cottonwood IgE: <0.1 kU/L
D Farinae IgE: 25.1 kU/L — AB
D Pteronyssinus IgE: 42.8 kU/L — AB
E001-IgE Cat Dander: <0.1 kU/L
E005-IgE Dog Dander: <0.1 kU/L
Elm, American IgE: <0.1 kU/L
IgE (Immunoglobulin E), Serum: 170 IU/mL (ref 6–495)
Johnson Grass IgE: <0.1 kU/L
Maple/Box Elder IgE: <0.1 kU/L
Mouse Urine IgE: <0.1 kU/L
Oak, White IgE: <0.1 kU/L
Pecan, Hickory IgE: <0.1 kU/L
Penicillium Chrysogen IgE: <0.1 kU/L
Pigweed, Rough IgE: <0.1 kU/L
Ragweed, Short IgE: <0.1 kU/L
Sheep Sorrel IgE Qn: <0.1 kU/L
Timothy Grass IgE: <0.1 kU/L
White Mulberry IgE: <0.1 kU/L

## 2022-08-20 LAB — ALLERGEN PROFILE, MOLD
Aureobasidi Pullulans IgE: <0.1 kU/L
Candida Albicans IgE: <0.1 kU/L
M009-IgE Fusarium proliferatum: <0.1 kU/L
M014-IgE Epicoccum purpur: <0.1 kU/L
Mucor Racemosus IgE: <0.1 kU/L
Phoma Betae IgE: <0.1 kU/L
Setomelanomma Rostrat: <0.1 kU/L
Stemphylium Herbarum IgE: <0.1 kU/L

## 2022-08-22 ENCOUNTER — Encounter (HOSPITAL_COMMUNITY): Payer: Self-pay | Admitting: Psychiatry

## 2022-08-22 ENCOUNTER — Telehealth (INDEPENDENT_AMBULATORY_CARE_PROVIDER_SITE_OTHER): Payer: Medicaid Other | Admitting: Psychiatry

## 2022-08-22 DIAGNOSIS — F333 Major depressive disorder, recurrent, severe with psychotic symptoms: Secondary | ICD-10-CM | POA: Diagnosis not present

## 2022-08-22 DIAGNOSIS — F902 Attention-deficit hyperactivity disorder, combined type: Secondary | ICD-10-CM

## 2022-08-22 MED ORDER — ATOMOXETINE HCL 60 MG PO CAPS: 60.0000 mg | ORAL_CAPSULE | Freq: Every day | ORAL | 2 refills | Status: DC

## 2022-08-22 MED ORDER — SERTRALINE HCL 50 MG PO TABS: 75.0000 mg | ORAL_TABLET | Freq: Every day | ORAL | 2 refills | Status: DC

## 2022-08-22 MED ORDER — HYDROXYZINE PAMOATE 25 MG PO CAPS: 25.0000 mg | ORAL_CAPSULE | Freq: Every day | ORAL | 2 refills | Status: DC | PRN

## 2022-08-22 MED ORDER — CLONAZEPAM 0.5 MG PO TABS: 0.5000 mg | ORAL_TABLET | Freq: Two times a day (BID) | ORAL | 2 refills | Status: DC

## 2022-08-22 MED ORDER — CARIPRAZINE HCL 1.5 MG PO CAPS: 1.5000 mg | ORAL_CAPSULE | Freq: Every day | ORAL | 2 refills | Status: DC

## 2022-08-22 MED ORDER — BUSPIRONE HCL 10 MG PO TABS: 10.0000 mg | ORAL_TABLET | Freq: Three times a day (TID) | ORAL | 2 refills | Status: DC

## 2022-08-22 NOTE — Progress Notes (Signed)
Virtual Visit via Telephone Note  I connected with Molly [name] on 08/22/22 at  1:40 PM EDT by telephone and verified that I am speaking with the correct person using two identifiers.  Location: Patient: home Provider: office   I discussed the limitations, risks, security and privacy concerns of performing an evaluation and management service by telephone and the availability of in person appointments. I also discussed with the patient that there may be a patient responsible charge related to this service. The patient expressed understanding and agreed to proceed.      I discussed the assessment and treatment plan with the patient. The patient was provided an opportunity to ask questions and all were answered. The patient agreed with the plan and demonstrated an understanding of the instructions.   The patient was advised to call back or seek an in-person evaluation if the symptoms worsen or if the condition fails to improve as anticipated.  I provided 15 minutes of non-face-to-face time during this encounter.   [name], MD  [facility] MD/PA/NP OP Progress Note  08/22/2022 1:59 PM Molly [name]  MRN:  [id number]  Chief Complaint:  Chief Complaint  Patient presents with   Anxiety   Depression   ADD   Follow-up   HPI: This patient is a 21 year old female who lives with her mother mother's fianc and a younger brother in [place].  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  The patient returns for follow-up after 2 months.  She is doing an Education officer, museum and really enjoying it.  She only does 2 classes at a time so she has plenty of time to learn the material.  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  She states that overall her mood has been good and she denies significant anxiety.  She sometimes wakes up through the night with "pots syndrome symptoms."  I again encouraged her to drink lots of fluid.  She denies any  thoughts of self-harm or suicide and her mood is very upbeat today. Visit Diagnosis:    ICD-10-CM   1. Severe episode of recurrent major depressive disorder, with psychotic features (HCC)  F33.3     2. Attention deficit hyperactivity disorder (ADHD), combined type  F90.2       Past Psychiatric History: Prior psychiatric hospitalizations, 2 [facility] of [facility]  Past Medical History:  Past Medical History:  Diagnosis Date   Acid reflux 2010   Acne vulgaris 2017   ADHD (attention deficit hyperactivity disorder) 2011   Adverse food reaction 07/11/2016   Shellfish allergy   Amenorrhea 2019   Anaphylactic shock due to adverse food reaction 01/15/2018   Anxiety 2017   Chronic constipation 09/05/2011   Chronic tension headaches 2011   originally followed by [facility] K.Griffin. Then Dr Alexis Goodell [facility] 03/2020 (see above)    Coalition, calcaneus navicular    Dysmenorrhea    Eczema    Eustachian tube dysfunction, left 10/23/2019   Frequent headaches    Galactorrhea    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 04/01/2019   Hiatal hernia    History of atrial septal defect repair 12/04/2011   Hyperlipidemia 2013   Hypertension 2011   Hypertension 09/28/2020   hypertension, cellulitus    Idiopathic urticaria 09/05/2011   Insomnia    Keratosis pilaris 01/15/2018   Mass of left thigh 11/22/2017   Overview:  Added automatically from request for surgery [id number]   MDD (major depressive  disorder), severe (HCC) 03/06/2018   Migraine 2011   re-diagnosed as chronic migraines 03/2020 [facility] - starting on preventative    Obesity without serious comorbidity with body mass index (BMI) in 95th to 98th percentile for age in pediatric patient 09/04/2018   Piriformis syndrome    POTS (postural orthostatic tachycardia syndrome)    PTSD (post-traumatic stress disorder)    Scoliosis 2019   Seasonal and perennial allergic rhinitis 2010   Severe persistent asthma without complication 2010    Sleep paralysis    Suicidal ideation 03/07/2018   Tic disorder 2019    Past Surgical History:  Procedure Laterality Date   ASD REPAIR  2012   with Helix, via Cardiac Catheterization   atrial septal defecgt     CARDIAC SURGERY     DENTAL SURGERY  2020   lipoma removal  2019   LUMBAR PUNCTURE  05/20/2021   TONSILLECTOMY AND ADENOIDECTOMY  2010    Family Psychiatric History: See below  Family History:  Family History  Problem Relation Age of Onset   Hypertension Mother    Hyperlipidemia Mother    Diabetes Mother    Depression Mother    Arthritis Mother    Allergic rhinitis Mother    Asthma Mother    Bipolar disorder Mother    Anxiety disorder Mother    Mental illness Mother    Miscarriages / [place] Mother    Thyroid disease Mother    Bipolar disorder Father    Drug abuse Father    Alcohol abuse Father    ADD / ADHD Father    Mental illness Father    Allergic rhinitis Brother    Asthma Brother    ADD / ADHD Brother    Learning disabilities Brother    Hypertension Maternal Grandmother    Hyperlipidemia Maternal Grandmother    Heart disease Maternal Grandmother    Bipolar disorder Maternal Grandfather    Angioedema Neg Hx    Eczema Neg Hx    Immunodeficiency Neg Hx    Urticaria Neg Hx     Social History:  Social History   Socioeconomic History   Marital status: Single    Spouse name: Not on file   Number of children: Not on file   Years of education: Not on file   Highest education level: Not on file  Occupational History   Not on file  Tobacco Use   Smoking status: Never   Smokeless tobacco: Never  Vaping Use   Vaping Use: Never used  Substance and Sexual Activity   Alcohol use: No    Alcohol/week: 0.0 standard drinks of alcohol   Drug use: No   Sexual activity: Yes  Other Topics Concern   Not on file  Social History Narrative   Not on file   Social Determinants of Health   Financial Resource Strain: Not on file  Food Insecurity: Not on  file  Transportation Needs: Not on file  Physical Activity: Not on file  Stress: Not on file  Social Connections: Not on file    Allergies:  Allergies  Allergen Reactions   Glucosamine Forte [Nutritional Supplements] Anaphylaxis    Due to containing shellfish   Nutritional Supplements Anaphylaxis   Oyster Extract Anaphylaxis   Shellfish-Derived Products Anaphylaxis    Throat swelling   Cats Claw (Uncaria Tomentosa) Nausea And Vomiting   Diamox [Acetazolamide] Other (See Comments)    Numbness- feet, hands, lips   Lactose    Lactose Intolerance (Gi)  Topiramate Other (See Comments)    Numbness to face, hands and feet   Bromfed Dm [Pseudoeph-Bromphen-Dm] Anxiety    CARBOFED DM    Metabolic Disorder Labs: Lab Results  Component Value Date   HGBA1C 5.2 03/06/2022   MPG 105.41 03/07/2018   Lab Results  Component Value Date   PROLACTIN 49.7 (H) 03/07/2018   Lab Results  Component Value Date   CHOL 192 (H) 03/06/2022   TRIG 84 03/06/2022   HDL 39 (L) 03/06/2022   CHOLHDL 4.9 (H) 03/06/2022   VLDL 20 03/07/2018   LDLCALC 138 (H) 03/06/2022   LDLCALC 105 11/26/2019   Lab Results  Component Value Date   TSH 1.550 03/06/2022   TSH 2.434 03/07/2018    Therapeutic Level Labs: No results found for: "LITHIUM" No results found for: "VALPROATE" No results found for: "CBMZ"  Current Medications: Current Outpatient Medications  Medication Sig Dispense Refill   albuterol (PROVENTIL) (2.5 MG/3ML) 0.083% nebulizer solution Take 3 mLs (2.5 mg total) by nebulization every 6 (six) hours as needed for wheezing or shortness of breath. 75 mL 1   ascorbic acid (VITAMIN C) 500 MG tablet Take 500 mg by mouth daily.     atomoxetine (STRATTERA) 60 MG capsule Take 1 capsule (60 mg total) by mouth daily. 30 capsule 2   Azelastine-Fluticasone 137-50 MCG/ACT SUSP Place 1 spray into both nostrils 2 (two) times daily. 23 g 5   budesonide-formoterol (SYMBICORT) 160-4.5 MCG/ACT inhaler  Inhale 2 puffs into the lungs in the morning and at bedtime. 1 each 5   busPIRone (BUSPAR) 10 MG tablet Take 1 tablet (10 mg total) by mouth 3 (three) times daily. 90 tablet 2   calcium carbonate (OS-CAL) 1250 (500 Ca) MG chewable tablet Chew by mouth.     cariprazine (VRAYLAR) 1.5 MG capsule Take 1 capsule (1.5 mg total) by mouth daily. 30 capsule 2   Cholecalciferol 25 MCG (1000 UT) tablet Take 1 Units by mouth daily at 2 PM.     clobetasol (TEMOVATE) 0.05 % external solution APPLY TO AFFECTED AREA TWICE A DAY 50 mL 3   clonazePAM (KLONOPIN) 0.5 MG tablet Take 1 tablet (0.5 mg total) by mouth 2 (two) times daily. 60 tablet 2   EPINEPHrine 0.3 mg/0.3 mL IJ SOAJ injection Inject 0.3 mg into the muscle as needed for anaphylaxis. (Patient not taking: Reported on 08/15/2022) 2 each 2   fexofenadine (ALLEGRA) 180 MG tablet Take 1 tablet (180 mg total) by mouth daily. 30 tablet 5   Fluocinolone Acetonide 0.01 % OIL Place in ear(s).     Galcanezumab-gnlm 120 MG/ML SOAJ Inject into the skin.     hydrOXYzine (VISTARIL) 25 MG capsule Take 1-2 capsules (25-50 mg total) by mouth daily as needed. 60 capsule 2   hyoscyamine (LEVSIN SL) 0.125 MG SL tablet Take by mouth.     LINZESS 72 MCG capsule Take 72 mcg by mouth daily.     methocarbamol (ROBAXIN) 500 MG tablet Take 1 tablet (500 mg total) by mouth every 8 (eight) hours as needed for muscle spasms. 30 tablet 0   metoprolol succinate (TOPROL-XL) 50 MG 24 hr tablet Take 1 tablet by mouth daily.     montelukast (SINGULAIR) 10 MG tablet Take 1 tablet (10 mg total) by mouth daily. 30 tablet 5   Multiple Vitamin (MULTIVITAMIN) capsule Take by mouth.     norethindrone (AYGESTIN) 5 MG tablet Take 5 mg by mouth daily.     nystatin cream (MYCOSTATIN) Apply 1  Application topically in the morning, at noon, in the evening, and at bedtime. 60 g 1   Olopatadine HCl 0.2 % SOLN Apply 1 drop to eye every morning.     ondansetron (ZOFRAN-ODT) 4 MG disintegrating tablet  DISSOLVE ON TONGUE AT ONSET OF NAUSEA- 30 DAY SUPPLY 10 tablet 1   pantoprazole (PROTONIX) 40 MG tablet Take 1 tablet by mouth in the morning and at bedtime.     sertraline (ZOLOFT) 50 MG tablet Take 1.5 tablets (75 mg total) by mouth daily. 45 tablet 2   SUMAtriptan (IMITREX) 50 MG tablet 1 tablet at onset of migraine. Repeat in 2 hours of needed. Limit to 2 doses/day, 2 times per week.     VENTOLIN HFA 108 (90 Base) MCG/ACT inhaler Inhale 2 puffs into the lungs every 4 (four) hours as needed for wheezing or shortness of breath. 18 g 1   zinc gluconate 50 MG tablet Take 50 mg by mouth daily.     No current facility-administered medications for this visit.     Musculoskeletal: Strength & Muscle Tone: na Gait & Station: na Patient leans: N/A  Psychiatric Specialty Exam: Review of Systems  Neurological:  Positive for light-headedness and headaches.  All other systems reviewed and are negative.   There were no vitals taken for this visit.There is no height or weight on file to calculate BMI.  General Appearance: NA  Eye Contact:  NA  Speech:  Clear and Coherent  Volume:  Normal  Mood:  Euthymic  Affect:  NA  Thought Process:  Goal Directed  Orientation:  Full (Time, Place, and Person)  Thought Content: WDL   Suicidal Thoughts:  No  Homicidal Thoughts:  No  Memory:  Immediate;   Good Recent;   Good Remote;   Fair  Judgement:  Good  Insight:  Fair  Psychomotor Activity:  Normal  Concentration:  Concentration: Good and Attention Span: Good  Recall:  Good  Fund of Knowledge: Good  Language: Good  Akathisia:  No  Handed:  Right  AIMS (if indicated): not done  Assets:  Communication Skills Desire for Improvement Physical Health Resilience Social Support Talents/Skills  ADL's:  Intact  Cognition: WNL  Sleep:  Good   Screenings: GAD-7    Flowsheet Row Office Visit from 08/15/2022 in [facility] Office Visit from 07/11/2022 in [facility] Office Visit from 04/04/2022 in [facility]  Total GAD-7 Score PHQ2-9    Flowsheet Row Video [facility]20[facility] Office Visit from 08/15/2022 in [facility] Office Visit from 07/11/2022 in [facility] Video Visit from 06/23/2022 in [facility] Video Visit from 04/19/2022 in [facility]  PHQ-2 Total Score 0 0 1 0 0  PHQ-9 Total Score -- 2 3 -- --      Flowsheet Row Video Visit from 08/22/2022 in [facility] Video Visit from 06/23/2022 in [facility] Video Visit from 04/19/2022 in [facility]  C-SSRS RISK CATEGORY No Risk No Risk No Risk        Assessment and Plan: This patient is a 21 year old female with a history of depression anxiety possible bipolar disorder sleep difficulties and ADD.  She will continue Strattera 60 mg daily for focus, hydroxyzine 25 to 50 mg at bedtime for sleep, clonazepam 0.5 to 1  mg daily as needed for anxiety, Vraylar 1.5 mg daily for mood stabilization, Zoloft 75 mg daily for depression and anxiety and BuSpar 10 mg 3 times daily for anxiety.  She will return to see me in 3 months  Collaboration of Care: Collaboration of Care: Primary Care Provider AEB notes are shared with PCP on the epic system  Patient/Guardian was advised Release of Information must be obtained prior to any record release in order to collaborate their care with an outside provider. Patient/Guardian was advised if they have not already done so to contact the registration department to sign all necessary forms in order for [place] to release information regarding their care.   Consent: Patient/Guardian gives verbal consent for treatment and  assignment of benefits for services provided during this visit. Patient/Guardian expressed understanding and agreed to proceed.    [name], MD 08/22/2022, 1:59 PM

## 2022-09-07 ENCOUNTER — Encounter: Payer: Self-pay | Admitting: Family Medicine

## 2022-09-07 ENCOUNTER — Ambulatory Visit (INDEPENDENT_AMBULATORY_CARE_PROVIDER_SITE_OTHER): Payer: Medicaid Other | Admitting: Family Medicine

## 2022-09-07 VITALS — BP 131/84 | HR 96 | Temp 97.5°F | Ht 59.0 in | Wt 240.0 lb

## 2022-09-07 DIAGNOSIS — Z8742 Personal history of other diseases of the female genital tract: Secondary | ICD-10-CM

## 2022-09-07 DIAGNOSIS — L03012 Cellulitis of left finger: Secondary | ICD-10-CM

## 2022-09-07 MED ORDER — FLUCONAZOLE 150 MG PO TABS: 150.0000 mg | ORAL_TABLET | Freq: Once | ORAL | 0 refills | Status: AC

## 2022-09-07 MED ORDER — CEPHALEXIN 500 MG PO CAPS: 500.0000 mg | ORAL_CAPSULE | Freq: Four times a day (QID) | ORAL | 0 refills | Status: AC

## 2022-09-07 NOTE — Progress Notes (Signed)
Subjective:  Patient ID: Molly [name], female    DOB: 08/14/2001, 21 y.o.   MRN: [id number]  Patient Care Team: Gwenlyn Fudge, FNP as PCP - General (Family Medicine) Dellis Anes Hetty Ely, MD as Consulting Physician (Allergy and Immunology) Myrlene Broker, MD as Consulting Physician [facility]) Shaune Pollack, MD as Consulting Physician (Neurology) Bevelyn Buckles, MD as Consulting Physician (Pediatric Gastroenterology)   Chief Complaint:  left thumb pain (Clipped ingrown nail)   HPI: Molly [name] is a 21 y.o. female presenting on 09/07/2022 for left thumb pain (Clipped ingrown nail)   Pt presents today for evaluation of redness and swelling to her lateral and proximal nail folds. States this started after the cut a "hang nail" several days ago. She has been using topicals without relief of symptoms. No fever, chills, weakness, or distal finger swelling.       Relevant past medical, surgical, family, and social history reviewed and updated as indicated.  Allergies and medications reviewed and updated. Data reviewed: Chart in Epic.   Past Medical History:  Diagnosis Date   Acid reflux 2010   Acne vulgaris 2017   ADHD (attention deficit hyperactivity disorder) 2011   Adverse food reaction 07/11/2016   Shellfish allergy   Amenorrhea 2019   Anaphylactic shock due to adverse food reaction 01/15/2018   Anxiety 2017   Chronic constipation 09/05/2011   Chronic tension headaches 2011   originally followed by [facility] K.[facility]. Then Dr Alexis Goodell [facility] 03/2020 (see above)    Coalition, calcaneus navicular    Dysmenorrhea    Eczema    Eustachian tube dysfunction, left 10/23/2019   Frequent headaches    Galactorrhea    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 04/01/2019   Hiatal hernia    History of atrial septal defect repair 12/04/2011   Hyperlipidemia 2013   Hypertension 2011   Hypertension 09/28/2020   hypertension, cellulitus     Idiopathic urticaria 09/05/2011   Insomnia    Keratosis pilaris 01/15/2018   Mass of left thigh 11/22/2017   Overview:  Added automatically from request for surgery [id number]   MDD (major depressive disorder), severe (HCC) 03/06/2018   Migraine 2011   re-diagnosed as chronic migraines 03/2020 [facility] - starting on preventative    Obesity without serious comorbidity with body mass index (BMI) in 95th to 98th percentile for age in pediatric patient 09/04/2018   Piriformis syndrome    POTS (postural orthostatic tachycardia syndrome)    PTSD (post-traumatic stress disorder)    Scoliosis 2019   Seasonal and perennial allergic rhinitis 2010   Severe persistent asthma without complication 2010   Sleep paralysis    Suicidal ideation 03/07/2018   Tic disorder 2019    Past Surgical History:  Procedure Laterality Date   ASD REPAIR  2012   with Helix, via Cardiac Catheterization   atrial septal defecgt     CARDIAC SURGERY     DENTAL SURGERY  2020   lipoma removal  2019   LUMBAR PUNCTURE  05/20/2021   TONSILLECTOMY AND ADENOIDECTOMY  2010    Social History   Socioeconomic History   Marital status: Single    Spouse name: Not on file   Number of children: Not on file   Years of education: Not on file   Highest education level: Not on file  Occupational History   Not on file  Tobacco Use   Smoking status: Never   Smokeless tobacco: Never  Vaping Use  Vaping Use: Never used  Substance and Sexual Activity   Alcohol use: No    Alcohol/week: 0.0 standard drinks of alcohol   Drug use: No   Sexual activity: Yes  Other Topics Concern   Not on file  Social History Narrative   Not on file   Social Determinants of Health   Financial Resource Strain: Not on file  Food Insecurity: Not on file  Transportation Needs: Not on file  Physical Activity: Not on file  Stress: Not on file  Social Connections: Not on file  Intimate Partner Violence: Not on file    Outpatient Encounter  Medications as of 09/07/2022  Medication Sig   albuterol (PROVENTIL) (2.5 MG/3ML) 0.083% nebulizer solution Take 3 mLs (2.5 mg total) by nebulization every 6 (six) hours as needed for wheezing or shortness of breath.   ascorbic acid (VITAMIN C) 500 MG tablet Take 500 mg by mouth daily.   atomoxetine (STRATTERA) 60 MG capsule Take 1 capsule (60 mg total) by mouth daily.   Azelastine-Fluticasone 137-50 MCG/ACT SUSP Place 1 spray into both nostrils 2 (two) times daily.   budesonide-formoterol (SYMBICORT) 160-4.5 MCG/ACT inhaler Inhale 2 puffs into the lungs in the morning and at bedtime.   busPIRone (BUSPAR) 10 MG tablet Take 1 tablet (10 mg total) by mouth 3 (three) times daily.   calcium carbonate (OS-CAL) 1250 (500 Ca) MG chewable tablet Chew by mouth.   cariprazine (VRAYLAR) 1.5 MG capsule Take 1 capsule (1.5 mg total) by mouth daily.   cephALEXin (KEFLEX) 500 MG capsule Take 1 capsule (500 mg total) by mouth 4 (four) times daily for 7 days.   Cholecalciferol 25 MCG (1000 UT) tablet Take 1 Units by mouth daily at 2 PM.   clobetasol (TEMOVATE) 0.05 % external solution APPLY TO AFFECTED AREA TWICE A DAY   clonazePAM (KLONOPIN) 0.5 MG tablet Take 1 tablet (0.5 mg total) by mouth 2 (two) times daily.   EPINEPHrine 0.3 mg/0.3 mL IJ SOAJ injection Inject 0.3 mg into the muscle as needed for anaphylaxis.   fexofenadine (ALLEGRA) 180 MG tablet Take 1 tablet (180 mg total) by mouth daily.   fluconazole (DIFLUCAN) 150 MG tablet Take 1 tablet (150 mg total) by mouth once for 1 dose.   Fluocinolone Acetonide 0.01 % OIL Place in ear(s).   Galcanezumab-gnlm 120 MG/ML SOAJ Inject into the skin.   hydrOXYzine (VISTARIL) 25 MG capsule Take 1-2 capsules (25-50 mg total) by mouth daily as needed.   hyoscyamine (LEVSIN SL) 0.125 MG SL tablet Take by mouth.   LINZESS 72 MCG capsule Take 72 mcg by mouth daily.   methocarbamol (ROBAXIN) 500 MG tablet Take 1 tablet (500 mg total) by mouth every 8 (eight) hours as  needed for muscle spasms.   metoprolol succinate (TOPROL-XL) 50 MG 24 hr tablet Take 1 tablet by mouth daily.   montelukast (SINGULAIR) 10 MG tablet Take 1 tablet (10 mg total) by mouth daily.   Multiple Vitamin (MULTIVITAMIN) capsule Take by mouth.   norethindrone (AYGESTIN) 5 MG tablet Take 5 mg by mouth daily.   nystatin cream (MYCOSTATIN) Apply 1 Application topically in the morning, at noon, in the evening, and at bedtime.   Olopatadine HCl 0.2 % SOLN Apply 1 drop to eye every morning.   ondansetron (ZOFRAN-ODT) 4 MG disintegrating tablet DISSOLVE ON TONGUE AT ONSET OF NAUSEA- 30 DAY SUPPLY   pantoprazole (PROTONIX) 40 MG tablet Take 1 tablet by mouth in the morning and at bedtime.   sertraline (ZOLOFT)  50 MG tablet Take 1.5 tablets (75 mg total) by mouth daily.   SUMAtriptan (IMITREX) 100 MG tablet Take by mouth.   VENTOLIN HFA 108 (90 Base) MCG/ACT inhaler Inhale 2 puffs into the lungs every 4 (four) hours as needed for wheezing or shortness of breath.   zinc gluconate 50 MG tablet Take 50 mg by mouth daily.   [DISCONTINUED] SUMAtriptan (IMITREX) 50 MG tablet 1 tablet at onset of migraine. Repeat in 2 hours of needed. Limit to 2 doses/day, 2 times per week.   No facility-administered encounter medications on file as of 09/07/2022.    Allergies  Allergen Reactions   Glucosamine Forte [Nutritional Supplements] Anaphylaxis    Due to containing shellfish   Nutritional Supplements Anaphylaxis   Oyster Extract Anaphylaxis   Shellfish-Derived Products Anaphylaxis    Throat swelling   Cats Claw (Uncaria Tomentosa) Nausea And Vomiting   Diamox [Acetazolamide] Other (See Comments)    Numbness- feet, hands, lips   Lactose    Lactose Intolerance (Gi)    Topiramate Other (See Comments)    Numbness to face, hands and feet   Bromfed Dm [Pseudoeph-Bromphen-Dm] Anxiety    CARBOFED DM    Review of Systems  Constitutional:  Negative for activity change, appetite change, chills, diaphoresis,  fatigue, fever and unexpected weight change.  Respiratory:  Negative for cough and shortness of breath.   Cardiovascular:  Negative for chest pain, palpitations and leg swelling.  Gastrointestinal:  Negative for abdominal pain, diarrhea, nausea and vomiting.  Genitourinary:  Negative for decreased urine volume and difficulty urinating.  Skin:  Positive for color change and wound. Negative for pallor and rash.  Neurological:  Negative for weakness.  Psychiatric/Behavioral:  Negative for confusion.   All other systems reviewed and are negative.       Objective:  BP 131/84   Pulse 96   Temp (!) 97.5 F (36.4 C)   Ht 4\' 11"  (1.499 m)   Wt 240 lb (108.9 kg)   SpO2 96%   BMI 48.47 kg/m    Wt Readings from Last 3 Encounters:  09/07/22 240 lb (108.9 kg) (>99 %, Z= 2.40)*  08/15/22 241 lb (109.3 kg) (>99 %, Z= 2.41)*  08/11/22 240 lb 4 oz (109 kg) (>99 %, Z= 2.40)*   * Growth percentiles are based on CDC (Girls, 2-20 Years) data.    Physical Exam Vitals and nursing note reviewed.  Constitutional:      General: She is not in acute distress.    Appearance: Normal appearance. She is obese. She is not ill-appearing, toxic-appearing or diaphoretic.  HENT:     Head: Normocephalic and atraumatic.     Mouth/Throat:     Mouth: Mucous membranes are moist.  Eyes:     Pupils: Pupils are equal, round, and reactive to light.  Cardiovascular:     Rate and Rhythm: Normal rate and regular rhythm.     Heart sounds: Normal heart sounds.  Pulmonary:     Effort: Pulmonary effort is normal.     Breath sounds: Normal breath sounds.  Musculoskeletal:     Right lower leg: No edema.     Left lower leg: No edema.  Skin:    General: Skin is warm and dry.     Capillary Refill: Capillary refill takes less than 2 seconds.     Findings: Erythema and wound present.     Comments: Swelling, redness, and slight tenderness of lateral and proximal nail fold on left thumb.   Neurological:  General: No  focal deficit present.     Mental Status: She is alert and oriented to person, place, and time.  Psychiatric:        Mood and Affect: Mood normal.        Behavior: Behavior normal.        Thought Content: Thought content normal.        Judgment: Judgment normal.     Results for orders placed or performed in visit on 08/15/22  Rheumatoid Arthritis Profile  Result Value Ref Range   Rhuematoid fact SerPl-aCnc <10.0 <96.7 IU/mL   Cyclic Citrullin Peptide Ab <1 0 - 19 units       Pertinent labs & imaging results that were available during my care of the patient were reviewed by me and considered in my medical decision making.  Assessment & Plan:  Kiva was seen today for left thumb pain.  Diagnoses and all orders for this visit:  Paronychia of left thumb Symptomatic care discussed in detail. Medications as prescribed. Report new, worsening, or persistent symptoms.  -     cephALEXin (KEFLEX) 500 MG capsule; Take 1 capsule (500 mg total) by mouth 4 (four) times daily for 7 days.  History of vaginitis History of yeast with antibiotic use. Will send in below, pt aware of indications for use.  -     fluconazole (DIFLUCAN) 150 MG tablet; Take 1 tablet (150 mg total) by mouth once for 1 dose.     Continue all other maintenance medications.  Follow up plan: Return if symptoms worsen or fail to improve.   Continue healthy lifestyle choices, including diet (rich in fruits, vegetables, and lean proteins, and low in salt and simple carbohydrates) and exercise (at least 30 minutes of moderate physical activity daily).  Educational handout given for paronychia  The above assessment and management plan was discussed with the patient. The patient verbalized understanding of and has agreed to the management plan. Patient is aware to call the clinic if they develop any new symptoms or if symptoms persist or worsen. Patient is aware when to return to the clinic for a follow-up visit. Patient  educated on when it is appropriate to go to the emergency department.   Monia Pouch, FNP-C [facility] [phone]

## 2022-10-02 DIAGNOSIS — R002 Palpitations: Secondary | ICD-10-CM | POA: Insufficient documentation

## 2022-10-03 ENCOUNTER — Encounter: Payer: Self-pay | Admitting: Internal Medicine

## 2022-10-03 ENCOUNTER — Ambulatory Visit: Payer: Medicaid Other | Attending: Internal Medicine | Admitting: Internal Medicine

## 2022-10-03 VITALS — BP 142/96 | HR 112 | Ht 59.0 in | Wt 240.8 lb

## 2022-10-03 DIAGNOSIS — I10 Essential (primary) hypertension: Secondary | ICD-10-CM | POA: Insufficient documentation

## 2022-10-03 DIAGNOSIS — R002 Palpitations: Secondary | ICD-10-CM | POA: Insufficient documentation

## 2022-10-03 MED ORDER — BISOPROLOL FUMARATE 5 MG PO TABS: 5.0000 mg | ORAL_TABLET | Freq: Every day | ORAL | 3 refills | Status: DC

## 2022-10-03 MED ORDER — METOPROLOL TARTRATE 25 MG PO TABS: 25.0000 mg | ORAL_TABLET | ORAL | 1 refills | Status: DC | PRN

## 2022-10-03 NOTE — Progress Notes (Unsigned)
ELECTROPHYSIOLOGY CONSULT NOTE  Patient ID: Molly [name], MRN: [id number], DOB/AGE: 22-Nov-2001 21 y.o. Admit date: (Not on file) Date of Consult: 10/03/2022  Primary Physician: Baruch Gouty, FNP Primary Cardiologist:  prev [facility]     Molly [name] is a 21 y.o. female who is being seen today for the evaluation of tachypalpitations at the request of Darla Lesches .    HPI Molly [name] is a 21 y.o. female referred because her PCP has made a diagnosis of POTS.  She has a cardiac history that dates back at least to 2012 when she underwent ASD repair.  There were "heart issues" and "kidney issues;" since that time she is also struggled with hypertension.  Has been on 2 medications in the past lisinopril and amlodipine at the same time and was discontinued because of relatively low blood pressure is now on beta-blocker and has been for about a year and a half with some improvement in her other symptoms.  These include tachypalpitations with exercise intolerance and lightheadedness.  They are accompanied by profound intermittent diaphoresis.  Lightheadedness can be associated with presyncope.  Tachypalpitations are embedded both by sitting down and by stop and rest.  There have been occasional associated chest pains.  Heart rates on her pulse ox have been recorded as fast as 170.  Sometimes abrupt sometimes not abrupt in either onset or offset.  ECG narrative back to 2019 all have heart rates greater than 110 multiple prolonged monitoring was done [facility] however, these results are not available for review and she was told that these monitors were normal.  Serial echocardiograms most recently 5/22 demonstrated normal atrial size, no residual shunting normal right ventricular size and normal left ventricular function  Has had COVID twice, and the symptoms have worsened over the last 3 years subsequent to this.  She denies heat intolerance and shower  intolerance.  Menses have been suppressed with oral contraceptives.  Has been seen at [facility] with a diagnosis of chronic migraines Carries a diagnosis of anxiety/depression with 2 hospitalizations in the last years for depression  Asthma    Date Cr K Hgb TSH  3/23 0.82 4.7 14.4   7/23    2.05      Past Medical History:  Diagnosis Date   Acid reflux 2010   Acne vulgaris 2017   ADHD (attention deficit hyperactivity disorder) 2011   Adverse food reaction 07/11/2016   Shellfish allergy   Amenorrhea 2019   Anaphylactic shock due to adverse food reaction 01/15/2018   Anxiety 2017   Chronic constipation 09/05/2011   Chronic tension headaches 2011   originally followed by [facility]. Then Dr Consuello [place] [facility] 03/2020 (see above)    Coalition, calcaneus navicular    Dysmenorrhea    Eczema    Eustachian tube dysfunction, left 10/23/2019   Frequent headaches    Galactorrhea    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 04/01/2019   Hiatal hernia    History of atrial septal defect repair 12/04/2011   Hyperlipidemia 2013   Hypertension 2011   Hypertension 09/28/2020   hypertension, cellulitus    Idiopathic urticaria 09/05/2011   Insomnia    Keratosis pilaris 01/15/2018   Mass of left thigh 11/22/2017   Overview:  Added automatically from request for surgery [id number]   MDD (major depressive disorder), severe ([place]) 03/06/2018   Migraine 2011   re-diagnosed as chronic migraines 03/2020 [facility] - starting on preventative  Obesity without serious comorbidity with body mass index (BMI) in 95th to 98th percentile for age in pediatric patient 09/04/2018   Piriformis syndrome    POTS (postural orthostatic tachycardia syndrome)    PTSD (post-traumatic stress disorder)    Scoliosis 2019   Seasonal and perennial allergic rhinitis 2010   Severe persistent asthma without complication [id number]   Sleep paralysis    Suicidal ideation 03/07/2018   Tic disorder 2019      Surgical History:   Past Surgical History:  Procedure Laterality Date   ASD REPAIR  2012   with Helix, via Cardiac Catheterization   atrial septal defecgt     CARDIAC SURGERY     DENTAL SURGERY  2020   lipoma removal  2019   LUMBAR PUNCTURE  05/20/2021   TONSILLECTOMY AND ADENOIDECTOMY  2010     Home Meds: Current Meds  Medication Sig   albuterol (PROVENTIL) (2.5 MG/3ML) 0.083% nebulizer solution Take 3 mLs (2.5 mg total) by nebulization every 6 (six) hours as needed for wheezing or shortness of breath.   ascorbic acid (VITAMIN C) 500 MG tablet Take 500 mg by mouth daily.   atomoxetine (STRATTERA) 60 MG capsule Take 1 capsule (60 mg total) by mouth daily.   Azelastine-Fluticasone 137-50 MCG/ACT SUSP Place 1 spray into both nostrils 2 (two) times daily.   budesonide-formoterol (SYMBICORT) 160-4.5 MCG/ACT inhaler Inhale 2 puffs into the lungs in the morning and at bedtime.   busPIRone (BUSPAR) 10 MG tablet Take 1 tablet (10 mg total) by mouth 3 (three) times daily.   calcium carbonate (OS-CAL) 1250 (500 Ca) MG chewable tablet Chew by mouth.   cariprazine (VRAYLAR) 1.5 MG capsule Take 1 capsule (1.5 mg total) by mouth daily.   Cholecalciferol 25 MCG (1000 UT) tablet Take 1 Units by mouth daily at 2 PM.   clobetasol (TEMOVATE) 0.05 % external solution APPLY TO AFFECTED AREA TWICE A DAY   clonazePAM (KLONOPIN) 0.5 MG tablet Take 1 tablet (0.5 mg total) by mouth 2 (two) times daily.   EPINEPHrine 0.3 mg/0.3 mL IJ SOAJ injection Inject 0.3 mg into the muscle as needed for anaphylaxis.   fexofenadine (ALLEGRA) 180 MG tablet Take 1 tablet (180 mg total) by mouth daily.   Fluocinolone Acetonide 0.01 % OIL Place in ear(s).   Galcanezumab-gnlm 120 MG/ML SOAJ Inject into the skin.   hydrOXYzine (VISTARIL) 25 MG capsule Take 1-2 capsules (25-50 mg total) by mouth daily as needed.   hyoscyamine (LEVSIN SL) 0.125 MG SL tablet Take by mouth.   LINZESS 72 MCG capsule Take 72 mcg by mouth daily.   methocarbamol  (ROBAXIN) 500 MG tablet Take 1 tablet (500 mg total) by mouth every 8 (eight) hours as needed for muscle spasms.   metoprolol succinate (TOPROL-XL) 50 MG 24 hr tablet Take 1 tablet by mouth daily.   montelukast (SINGULAIR) 10 MG tablet Take 1 tablet (10 mg total) by mouth daily.   Multiple Vitamin (MULTIVITAMIN) capsule Take by mouth.   norethindrone (AYGESTIN) 5 MG tablet Take 5 mg by mouth daily.   nystatin cream (MYCOSTATIN) Apply 1 Application topically in the morning, at noon, in the evening, and at bedtime.   Olopatadine HCl 0.2 % SOLN Apply 1 drop to eye every morning.   ondansetron (ZOFRAN-ODT) 4 MG disintegrating tablet DISSOLVE ON TONGUE AT ONSET OF NAUSEA- 30 DAY SUPPLY   pantoprazole (PROTONIX) 40 MG tablet Take 1 tablet by mouth in the morning and at bedtime.   sertraline (ZOLOFT) 50 MG  tablet Take 1.5 tablets (75 mg total) by mouth daily.   SUMAtriptan (IMITREX) 100 MG tablet Take by mouth.   VENTOLIN HFA 108 (90 Base) MCG/ACT inhaler Inhale 2 puffs into the lungs every 4 (four) hours as needed for wheezing or shortness of breath.   zinc gluconate 50 MG tablet Take 50 mg by mouth daily.    Allergies:  Allergies  Allergen Reactions   Glucosamine Forte [Nutritional Supplements] Anaphylaxis    Due to containing shellfish   Nutritional Supplements Anaphylaxis   Oyster Extract Anaphylaxis   Shellfish-Derived Products Anaphylaxis    Throat swelling   Cats Claw (Uncaria Tomentosa) Nausea And Vomiting   Diamox [Acetazolamide] Other (See Comments)    Numbness- feet, hands, lips   Lactose    Lactose Intolerance (Gi)    Topiramate Other (See Comments)    Numbness to face, hands and feet   Bromfed Dm [Pseudoeph-Bromphen-Dm] Anxiety    CARBOFED DM    Social History   Socioeconomic History   Marital status: Single    Spouse name: Not on file   Number of children: Not on file   Years of education: Not on file   Highest education level: Not on file  Occupational History    Not on file  Tobacco Use   Smoking status: Never   Smokeless tobacco: Never  Vaping Use   Vaping Use: Never used  Substance and Sexual Activity   Alcohol use: No    Alcohol/week: 0.0 standard drinks of alcohol   Drug use: No   Sexual activity: Yes  Other Topics Concern   Not on file  Social History Narrative   Not on file   Social Determinants of Health   Financial Resource Strain: Not on file  Food Insecurity: Not on file  Transportation Needs: Not on file  Physical Activity: Not on file  Stress: Not on file  Social Connections: Not on file  Intimate Partner Violence: Not on file     Family History  Problem Relation Age of Onset   Hypertension Mother    Hyperlipidemia Mother    Diabetes Mother    Depression Mother    Arthritis Mother    Allergic rhinitis Mother    Asthma Mother    Bipolar disorder Mother    Anxiety disorder Mother    Mental illness Mother    Miscarriages / [place] Mother    Thyroid disease Mother    Bipolar disorder Father    Drug abuse Father    Alcohol abuse Father    ADD / ADHD Father    Mental illness Father    Allergic rhinitis Brother    Asthma Brother    ADD / ADHD Brother    Learning disabilities Brother    Hypertension Maternal Grandmother    Hyperlipidemia Maternal Grandmother    Heart disease Maternal Grandmother    Bipolar disorder Maternal Grandfather    Angioedema Neg Hx    Eczema Neg Hx    Immunodeficiency Neg Hx    Urticaria Neg Hx     ROS:  Please see the history of present illness.     All other systems reviewed and negative.    Physical Exam: Blood pressure (!) 142/96, pulse (!) 112, height 4\' 11"  (1.499 m), weight 240 lb 12.8 oz (109.2 kg), SpO2 94 %. General: Well developed, well nourished female in no acute distress. Head: Normocephalic, atraumatic, sclera non-icteric, no xanthomas, nares are without discharge. EENT: normal  Lymph Nodes:  none Neck: Negative for  carotid bruits. JVD not  elevated. Back:without scoliosis kyphosis Lungs: Clear bilaterally to auscultation without wheezes, rales, or rhonchi. Breathing is unlabored. Heart: RRR with S1 S2. No  murmur . No rubs, or gallops appreciated. Abdomen: Soft, non-tender, non-distended with normoactive bowel sounds. No hepatomegaly. No rebound/guarding. No obvious abdominal masses. Msk:  Strength and tone appear normal for age. Extremities: No clubbing or cyanosis. No  edema.  Distal pedal pulses are 2+ and equal bilaterally. Skin: Warm and Dry Neuro: Alert and oriented X 3. CN III-XII intact Grossly normal sensory and motor function . Psych:  Responds to questions appropriately with a normal affect.        EKG: Supraventricular long RP rhythm at 113 Interval 17/09/34 P wave negative in lead III isoelectric in lead II and F+ in leads I and L and negative lead aVR   Assessment and Plan:  Orthostatic intolerance  Hypertension  Atrial tachycardia  ASD repair  Morbid obesity  Depression-severe-better now  Asthma  Sleep disordered breathing   Patient has orthostatic intolerance without diagnostic criteria for POTS.  She has had multiple monitors which she was told "normal ", I presume that what was meant was that this was "sinus tachycardia" her ECG today however is provocatively not normal with negative P waves in the inferior leads suggesting an ectopic focus and in the context of her prior ASD begs the question as to whether her tachycardia is indeed not sinus although upon standing today what ever the mechanism was was responsive to orthostatic stress as are her symptoms.  That being the case, we need to clarify the mechanism of her rhythm which we will do with a Zio patch.  May want to repeat orthostatics with twelve-lead electrocardiogram.  Fatigue with her metoprolol and not particularly effective.  We will try her on bisoprolol with as needed low-dose metoprolol tartrate to be used as needed.  Encourage  fluid resuscitation to the point of clear urine.  With her hypertension salt repletion is not appropriate  We discussed extensively the issues of dysautonomia, the physiology of orthstasis and positional stress.  We discussed the role of salt and water repletion, the importance of exercise, often needing to be started in the recumbent position, and the awareness of triggers and the role of ambient heat and dehydration.  We also discussed the role of standing training.  I have reached out to Dr. Oval Linsey as to whether an evaluation for secondary hypertension is appropriate in this young lady has had a 10 years of it; she will see her in consultation.  In anticipation we will order 24-hour urine for metanephrines.   Her mental health is better.  Virl Axe

## 2022-10-03 NOTE — Patient Instructions (Addendum)
Medication Instructions: Your physician has recommended you make the following change in your medication:   ** Stop Metoprolol Succinate  ** Begin Bisoprolol 5mg  - 1 tablet by mouth daily  ** Begin Metoprolol Tartrate 25mg  - 1 tablet by mouth as needed  *If you need a refill on your cardiac medications before your next appointment, please call your pharmacy*   Lab Work: 24 hour urine - You will need to go to a [employer] office to pick up materials.  If you have labs (blood work) drawn today and your tests are completely normal, you will receive your results only by: [place] (if you have MyChart) OR A paper copy in the mail If you have any lab test that is abnormal or we need to change your treatment, we will call you to review the results.   Testing/Procedures: [place]- Long Term Monitor Instructions  Your physician has requested you wear a ZIO patch monitor for 14 days.  This is a single patch monitor. [employer] supplies one patch monitor per enrollment. Additional stickers are not available. Please do not apply patch if you will be having a Nuclear Stress Test,  Echocardiogram, Cardiac CT, MRI, or Chest Xray during the period you would be wearing the  monitor. The patch cannot be worn during these tests. You cannot remove and re-apply the  ZIO XT patch monitor.  Your ZIO patch monitor will be mailed 3 day USPS to your address on file. It may take 3-5 days  to receive your monitor after you have been enrolled.  Once you have received your monitor, please review the enclosed instructions. Your monitor  has already been registered assigning a specific monitor serial # to you.  Billing and Patient Assistance Program Information  We have supplied [employer] with any of your insurance information on file for billing purposes. [employer] offers a sliding scale Patient Assistance Program for patients that do not have  insurance, or whose insurance does not completely cover the cost of  the ZIO monitor.  You must apply for the Patient Assistance Program to qualify for this discounted rate.  To apply, please call [employer] at [phone], select option 4, select option 2, ask to apply for  Patient Assistance Program. Theodore Demark will ask your household income, and how many people  are in your household. They will quote your out-of-pocket cost based on that information.  [employer] will also be able to set up a 41-month, interest-free payment plan if needed.  Applying the monitor   Shave hair from upper left chest.  Hold abrader disc by orange tab. Rub abrader in 40 strokes over the upper left chest as  indicated in your monitor instructions.  Clean area with 4 enclosed alcohol pads. Let dry.  Apply patch as indicated in monitor instructions. Patch will be placed under collarbone on left  side of chest with arrow pointing upward.  Rub patch adhesive wings for 2 minutes. Remove white label marked "1". Remove the white  label marked "2". Rub patch adhesive wings for 2 additional minutes.  While looking in a mirror, press and release button in center of patch. A small green light will  flash 3-4 times. This will be your only indicator that the monitor has been turned on.  Do not shower for the first 24 hours. You may shower after the first 24 hours.  Press the button if you feel a symptom. You will hear a small click. Record Date, Time and  Symptom in the Patient  Logbook.  When you are ready to remove the patch, follow instructions on the last 2 pages of Patient  Logbook. Stick patch monitor onto the last page of Patient Logbook.  Place Patient Logbook in the blue and white box. Use locking tab on box and tape box closed  securely. The blue and white box has prepaid postage on it. Please place it in the mailbox as  soon as possible. Your physician should have your test results approximately 7 days after the  monitor has been mailed back to [facility].  Call [place] [facility] at [phone] if you have questions regarding  your ZIO XT patch monitor. Call them immediately if you see an orange light blinking on your  monitor.  If your monitor falls off in less than 4 days, contact our Monitor department at [phone].  If your monitor becomes loose or falls off after 4 days call [employer] at [phone] for  suggestions on securing your monitor    Follow-Up: At [facility], you and your health needs are our priority.  As part of our continuing mission to provide you with exceptional heart care, we have created designated Provider Care Teams.  These Care Teams include your primary Cardiologist (physician) and Advanced Practice Providers (APPs -  Physician Assistants and Nurse Practitioners) who all work together to provide you with the care you need, when you need it.  We recommend signing up for the patient portal called "MyChart".  Sign up information is provided on this After Visit Summary.  MyChart is used to connect with patients for Virtual Visits (Telemedicine).  Patients are able to view lab/test results, encounter notes, upcoming appointments, etc.  Non-urgent messages can be sent to your provider as well.   To learn more about what you can do with MyChart, go to [email or web address].    Your next appointment:   6-8 weeks with telephone appointment with Dr Caryl Comes - I will have his scheduler call you.  Referred to Dr Oval Linsey for Advanced Hypertension [place]

## 2022-10-04 ENCOUNTER — Ambulatory Visit: Payer: Medicaid Other | Attending: Internal Medicine

## 2022-10-04 DIAGNOSIS — R002 Palpitations: Secondary | ICD-10-CM

## 2022-10-04 NOTE — Progress Notes (Unsigned)
Enrolled patient for a 14 day Zio XT  monitor to be mailed to patients home  °

## 2022-10-09 ENCOUNTER — Telehealth: Payer: Self-pay | Admitting: Internal Medicine

## 2022-10-09 DIAGNOSIS — R002 Palpitations: Secondary | ICD-10-CM | POA: Diagnosis not present

## 2022-10-09 NOTE — Telephone Encounter (Signed)
Pt c/o medication issue:  1. Name of Medication: bisoprolol (ZEBETA) 5 MG tablet   2. How are you currently taking this medication (dosage and times per day)? 1 tablet daily  3. Are you having a reaction (difficulty breathing--STAT)? no  4. What is your medication issue? Patient's mother says the patient started having tingling in her hands when she took the medication. She says this happened when the patient took topamax and she was told to stop it. She says the patient took it yesterday and today, because the tingling did not last long. She would like to know if the patient is okay to continue the bisoprolol.

## 2022-10-09 NOTE — Telephone Encounter (Signed)
Spoke with pt who states she has taken 2 doses of the Bisoprolol and has had some mild tingling in both hands that started a couple of hours after taking the medication and lasted for about 3 hours.  Pt states other than this she has so far tolerated Bisoprolol without problem and has had a good response with her heart rate and BP.  BP today is 135/88.  She does not have a current HR. Pt is asking if hand tingling is a side effect of this medication.  Advised will forward to our pharmacy team for review as Dr Caryl Comes is out of the country for the next 2 weeks.  Pt states the tingling is very mild and not bothersome but would like to know if she should continue it at this point.

## 2022-10-10 ENCOUNTER — Telehealth: Payer: Self-pay

## 2022-10-10 NOTE — Telephone Encounter (Signed)
Yes, its possible it could cause tingling. If its not bothersome, than I would continue with medication at this point.

## 2022-10-10 NOTE — Telephone Encounter (Signed)
**Note De-Identified  Obfuscation** I called [place] and did a Bisoprolol PA over the phone with [facility]. Per Maya, Bisoprolol has been approved until 10/05/2023. PA #: [id number]  I have notified [id number] - [place], [place] (Ph: [phone]) of this approval.

## 2022-10-11 ENCOUNTER — Ambulatory Visit (INDEPENDENT_AMBULATORY_CARE_PROVIDER_SITE_OTHER): Payer: Medicaid Other | Admitting: Family Medicine

## 2022-10-11 ENCOUNTER — Encounter: Payer: Self-pay | Admitting: Family Medicine

## 2022-10-11 DIAGNOSIS — Z23 Encounter for immunization: Secondary | ICD-10-CM

## 2022-10-11 DIAGNOSIS — E538 Deficiency of other specified B group vitamins: Secondary | ICD-10-CM | POA: Diagnosis not present

## 2022-10-11 DIAGNOSIS — Z79899 Other long term (current) drug therapy: Secondary | ICD-10-CM

## 2022-10-11 DIAGNOSIS — R61 Generalized hyperhidrosis: Secondary | ICD-10-CM | POA: Diagnosis not present

## 2022-10-11 DIAGNOSIS — I10 Essential (primary) hypertension: Secondary | ICD-10-CM

## 2022-10-11 LAB — BAYER DCA HB A1C WAIVED: HB A1C (BAYER DCA - WAIVED): 5.8 % — ABNORMAL HIGH (ref 4.8–5.6)

## 2022-10-11 NOTE — Telephone Encounter (Signed)
Attempted phone call to pt to advise of recommendation per [facility].  Left voicemail message to contact RN at [phone].

## 2022-10-11 NOTE — Telephone Encounter (Signed)
Spoke with pt and advised of [facility]PH recommendation as below.  Pt states she is no longer having the tingling and will continue the Bisoprolol as prescribed.  Pt advised to follow up as scheduled.  Pt verbalized understanding and agrees with current plan.

## 2022-10-11 NOTE — Progress Notes (Signed)
Subjective:  Patient ID: Molly [name], female    DOB: 14-Mar-2001, 21 y.o.   MRN: [id number]  Patient Care Team: Baruch Gouty, FNP as PCP - General (Family Medicine) Ernst Bowler Gwenith Daily, MD as Consulting Physician (Allergy and Immunology) Cloria Spring, MD as Consulting Physician ([place]) Timoteo Gaul, MD as Consulting Physician (Neurology) Lillia Mountain, Cindee Lame, MD as Consulting Physician (Pediatric Gastroenterology)   Chief Complaint:  Medical Management of Chronic Issues   HPI: Molly [name] is a 21 y.o. female presenting on 10/11/2022 for Medical Management of Chronic Issues   Pt presents today to establish care with new PCP as her former PCP is not longer at this practice. She has a complex medical history and is followed by several specialists. Polypharmacy is present and pt aware to discuss simplifying her medications with her specialists. She has a history of POTS and was recently evaluated by cardiology, has a ZIO patch on at this time to evaluate for potential underlying arrhythmias. She does report ongoing palpitations with some dizziness/light-headedness, and fatigue. She has exertional shortness of breath and persistent diaphoresis. Medication changes were made by cardiology. Does not feel these changes have been beneficial. She is followed by [facility] on a regular basis and is on several medications prescribed by her psychiatrist. Some of these medications seem redundant and pt is encouraged to discuss this in detail with provider at next visit. She reports her weight just seems to continue to increase. States she is unable to exercise due to exertional fatigue, shortness of breath, and palpitations. She has not tried any stationary exercising such as chair aerobics.  She has GERD and is on PPI therapy. Followed by GI on a regular basis. No cough, voice changes, trouble swallowing, melena, hematochezia, or hemoptysis.      Relevant past  medical, surgical, family, and social history reviewed and updated as indicated.  Allergies and medications reviewed and updated. Data reviewed: Chart in Epic.   Past Medical History:  Diagnosis Date   Acid reflux 2010   Acne vulgaris 2017   ADHD (attention deficit hyperactivity disorder) 2011   Adverse food reaction 07/11/2016   Shellfish allergy   Amenorrhea 2019   Anaphylactic shock due to adverse food reaction 01/15/2018   Anxiety 2017   Chronic constipation 09/05/2011   Chronic tension headaches 2011   originally followed by [facility] K.Griffin. Then Dr Consuello Bossier [facility] 03/2020 (see above)    Coalition, calcaneus navicular    Dysmenorrhea    Eczema    Eustachian tube dysfunction, left 10/23/2019   Frequent headaches    Galactorrhea    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 04/01/2019   Hiatal hernia    History of atrial septal defect repair 12/04/2011   Hyperlipidemia 2013   Hypertension 2011   Hypertension 09/28/2020   hypertension, cellulitus    Idiopathic urticaria 09/05/2011   Insomnia    Keratosis pilaris 01/15/2018   Mass of left thigh 11/22/2017   Overview:  Added automatically from request for surgery [id number]   MDD (major depressive disorder), severe ([place]) 03/06/2018   Migraine 2011   re-diagnosed as chronic migraines 03/2020 [facility] - starting on preventative    Obesity without serious comorbidity with body mass index (BMI) in 95th to 98th percentile for age in pediatric patient 09/04/2018   Piriformis syndrome    POTS (postural orthostatic tachycardia syndrome)    PTSD (post-traumatic stress disorder)    Scoliosis 2019   Seasonal and perennial allergic  rhinitis 2010   Severe persistent asthma without complication 3474   Sleep paralysis    Suicidal ideation 03/07/2018   Tic disorder 2019    Past Surgical History:  Procedure Laterality Date   ASD REPAIR  2012   with Helix, via Cardiac Catheterization   atrial septal defecgt     CARDIAC SURGERY      DENTAL SURGERY  2020   lipoma removal  2019   LUMBAR PUNCTURE  05/20/2021   TONSILLECTOMY AND ADENOIDECTOMY  2010    Social History   Socioeconomic History   Marital status: Single    Spouse name: Not on file   Number of children: Not on file   Years of education: Not on file   Highest education level: Not on file  Occupational History   Not on file  Tobacco Use   Smoking status: Never   Smokeless tobacco: Never  Vaping Use   Vaping Use: Never used  Substance and Sexual Activity   Alcohol use: No    Alcohol/week: 0.0 standard drinks of alcohol   Drug use: No   Sexual activity: Yes  Other Topics Concern   Not on file  Social History Narrative   Not on file   Social Determinants of Health   Financial Resource Strain: Not on file  Food Insecurity: Not on file  Transportation Needs: Not on file  Physical Activity: Not on file  Stress: Not on file  Social Connections: Not on file  Intimate Partner Violence: Not on file    Outpatient Encounter Medications as of 10/11/2022  Medication Sig   albuterol (PROVENTIL) (2.5 MG/3ML) 0.083% nebulizer solution Take 3 mLs (2.5 mg total) by nebulization every 6 (six) hours as needed for wheezing or shortness of breath.   ascorbic acid (VITAMIN C) 500 MG tablet Take 500 mg by mouth daily.   atomoxetine (STRATTERA) 60 MG capsule Take 1 capsule (60 mg total) by mouth daily.   Azelastine-Fluticasone 137-50 MCG/ACT SUSP Place 1 spray into both nostrils 2 (two) times daily.   bisoprolol (ZEBETA) 5 MG tablet Take 1 tablet (5 mg total) by mouth daily.   budesonide-formoterol (SYMBICORT) 160-4.5 MCG/ACT inhaler Inhale 2 puffs into the lungs in the morning and at bedtime.   busPIRone (BUSPAR) 10 MG tablet Take 1 tablet (10 mg total) by mouth 3 (three) times daily.   calcium carbonate (OS-CAL) 1250 (500 Ca) MG chewable tablet Chew by mouth.   cariprazine (VRAYLAR) 1.5 MG capsule Take 1 capsule (1.5 mg total) by mouth daily.   Cholecalciferol 25  MCG (1000 UT) tablet Take 1 Units by mouth daily at 2 PM.   clobetasol (TEMOVATE) 0.05 % external solution APPLY TO AFFECTED AREA TWICE A DAY   clonazePAM (KLONOPIN) 0.5 MG tablet Take 1 tablet (0.5 mg total) by mouth 2 (two) times daily.   EPINEPHrine 0.3 mg/0.3 mL IJ SOAJ injection Inject 0.3 mg into the muscle as needed for anaphylaxis.   fexofenadine (ALLEGRA) 180 MG tablet Take 1 tablet (180 mg total) by mouth daily.   Fluocinolone Acetonide 0.01 % OIL Place in ear(s).   Galcanezumab-gnlm 120 MG/ML SOAJ Inject into the skin.   hydrOXYzine (VISTARIL) 25 MG capsule Take 1-2 capsules (25-50 mg total) by mouth daily as needed.   hyoscyamine (LEVSIN SL) 0.125 MG SL tablet Take by mouth.   LINZESS 72 MCG capsule Take 72 mcg by mouth daily.   methocarbamol (ROBAXIN) 500 MG tablet Take 1 tablet (500 mg total) by mouth every 8 (eight) hours  as needed for muscle spasms.   metoprolol tartrate (LOPRESSOR) 25 MG tablet Take 1 tablet (25 mg total) by mouth as needed.   montelukast (SINGULAIR) 10 MG tablet Take 1 tablet (10 mg total) by mouth daily.   Multiple Vitamin (MULTIVITAMIN) capsule Take by mouth.   norethindrone (AYGESTIN) 5 MG tablet Take 5 mg by mouth daily.   nystatin cream (MYCOSTATIN) Apply 1 Application topically in the morning, at noon, in the evening, and at bedtime.   Olopatadine HCl 0.2 % SOLN Apply 1 drop to eye every morning.   ondansetron (ZOFRAN-ODT) 4 MG disintegrating tablet DISSOLVE ON TONGUE AT ONSET OF NAUSEA- 30 DAY SUPPLY   pantoprazole (PROTONIX) 40 MG tablet Take 1 tablet by mouth in the morning and at bedtime.   sertraline (ZOLOFT) 50 MG tablet Take 1.5 tablets (75 mg total) by mouth daily.   SUMAtriptan (IMITREX) 100 MG tablet Take by mouth.   VENTOLIN HFA 108 (90 Base) MCG/ACT inhaler Inhale 2 puffs into the lungs every 4 (four) hours as needed for wheezing or shortness of breath.   zinc gluconate 50 MG tablet Take 50 mg by mouth daily.   No facility-administered  encounter medications on file as of 10/11/2022.    Allergies  Allergen Reactions   Glucosamine Forte [Nutritional Supplements] Anaphylaxis    Due to containing shellfish   Nutritional Supplements Anaphylaxis   Oyster Extract Anaphylaxis   Shellfish-Derived Products Anaphylaxis    Throat swelling   Cats Claw (Uncaria Tomentosa) Nausea And Vomiting   Diamox [Acetazolamide] Other (See Comments)    Numbness- feet, hands, lips   Lactose    Lactose Intolerance (Gi)    Topiramate Other (See Comments)    Numbness to face, hands and feet   Bromfed Dm [Pseudoeph-Bromphen-Dm] Anxiety    CARBOFED DM    Review of Systems  Constitutional:  Positive for diaphoresis and fatigue. Negative for activity change, appetite change, chills, fever and unexpected weight change.  HENT: Negative.    Eyes: Negative.   Respiratory:  Negative for cough, chest tightness and shortness of breath.   Cardiovascular:  Positive for palpitations. Negative for chest pain and leg swelling.  Gastrointestinal:  Negative for abdominal pain, blood in stool, constipation, diarrhea, nausea and vomiting.  Endocrine: Negative.  Negative for polydipsia, polyphagia and polyuria.  Genitourinary:  Negative for decreased urine volume, difficulty urinating, dysuria, frequency and urgency.  Musculoskeletal:  Positive for arthralgias and myalgias.  Skin: Negative.   Allergic/Immunologic: Negative.   Neurological:  Positive for dizziness, light-headedness and headaches. Negative for tremors, seizures, syncope, facial asymmetry, speech difficulty, weakness and numbness.  Hematological: Negative.   Psychiatric/Behavioral:  Negative for confusion, hallucinations, sleep disturbance and suicidal ideas.         Objective:  BP (!) 150/110   Pulse (!) 112   Temp (!) 96.7 F (35.9 C) (Temporal)   Resp 20   Ht _0  (1.499 m)   Wt 244 lb (110.7 kg)   SpO2 94%   BMI 49.28 kg/m    Wt Readings from Last 3 Encounters:  10/11/22 244  lb (110.7 kg) (>99 %, Z= 2.44)*  10/03/22 240 lb 12.8 oz (109.2 kg) (>99 %, Z= 2.41)*  09/07/22 240 lb (108.9 kg) (>99 %, Z= 2.40)*   * Growth percentiles are based on CDC (Girls, 2-20 Years) data.    Physical Exam Vitals and nursing note reviewed.  Constitutional:      General: She is not in acute distress.    Appearance: Normal  appearance. She is well-developed and well-groomed. She is morbidly obese. She is not ill-appearing, toxic-appearing or diaphoretic.  HENT:     Head: Normocephalic and atraumatic.     Jaw: There is normal jaw occlusion.     Right Ear: Hearing normal.     Left Ear: Hearing normal.     Nose: Nose normal.     Mouth/Throat:     Lips: Pink.     Mouth: Mucous membranes are moist.     Pharynx: Uvula midline.  Eyes:     General: Lids are normal.     Pupils: Pupils are equal, round, and reactive to light.  Neck:     Thyroid: No thyroid mass, thyromegaly or thyroid tenderness.     Vascular: No carotid bruit or JVD.     Trachea: Trachea and phonation normal.  Cardiovascular:     Rate and Rhythm: Regular rhythm. Tachycardia present.     Chest Wall: PMI is not displaced.     Pulses: Normal pulses.     Heart sounds: Normal heart sounds. No murmur heard.    No friction rub. No gallop.  Pulmonary:     Effort: Pulmonary effort is normal. No respiratory distress.     Breath sounds: Normal breath sounds. No wheezing.  Abdominal:     General: Bowel sounds are normal. There is no abdominal bruit.     Palpations: Abdomen is soft. There is no hepatomegaly or splenomegaly.  Musculoskeletal:        General: Normal range of motion.     Cervical back: Normal range of motion and neck supple.     Right lower leg: No edema.     Left lower leg: No edema.  Lymphadenopathy:     Cervical: No cervical adenopathy.  Skin:    General: Skin is warm and dry.     Capillary Refill: Capillary refill takes less than 2 seconds.     Coloration: Skin is not cyanotic, jaundiced or pale.      Findings: No rash.  Neurological:     General: No focal deficit present.     Mental Status: She is alert and oriented to person, place, and time.     Sensory: Sensation is intact.     Motor: Motor function is intact.     Coordination: Coordination is intact.     Gait: Gait is intact.     Deep Tendon Reflexes: Reflexes are normal and symmetric.  Psychiatric:        Attention and Perception: Attention and perception normal.        Mood and Affect: Mood and affect normal.        Speech: Speech normal.        Behavior: Behavior normal. Behavior is cooperative.        Thought Content: Thought content normal.        Cognition and Memory: Cognition and memory normal.        Judgment: Judgment normal.     Results for orders placed or performed in visit on 10/11/22  Bayer DCA Hb A1c Waived  Result Value Ref Range   HB A1C (BAYER DCA - WAIVED) 5.8 (H) 4.8 - 5.6 %       Pertinent labs & imaging results that were available during my care of the patient were reviewed by me and considered in my medical decision making.  Assessment & Plan:  Eiman was seen today for medical management of chronic issues.  Diagnoses and all orders for this  visit:  Morbid obesity ([place]) Diet and exercise encouraged. Labs pending. Discussed dietary and exercise changes that are beneficial for weight management.  -     CMP14+EGFR -     CBC with Differential/Platelet -     Bayer DCA Hb A1c Waived -     Vitamin B12 -     VITAMIN D 25 Hydroxy (Vit-D Deficiency, Fractures) -     Lipid panel -     Hormone Panel  Primary hypertension Not controlled today. Medications were recently adjusted by cardiology and pt has follow up scheduled. She is to monitor BP over next several weeks and report persistent high readings.  -     CMP14+EGFR -     CBC with Differential/Platelet -     Hormone Panel  B12 deficiency Labs pending. Further treatment pending results.  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  -     CMP14+EGFR -     Bayer DCA Hb A1c Waived -     Vitamin B12 -     Hormone Panel  Polypharmacy She is followed by [facility] on a regular basis and is on several medications prescribed by her psychiatrist. Some of these medications seem redundant and pt is encouraged to discuss this in detail with provider at next visit. She has been on PPI therapy for a long time. Advised to discussed tapering off of this with her GI provider.  Aware to discuss medications with cardiologist at follow up as BP and HR is not well controlled.  Aware to discuss migraine regimen with neurology as triptan therapy could be causative of some of her symptoms.   Need for immunization against influenza -     Flu Vaccine QUAD 41moIM (Fluarix, Fluzone & Alfiuria Quad PF)     Continue all other maintenance medications.  Follow up plan: Return if symptoms worsen or fail to improve.   Continue healthy lifestyle choices, including diet (rich in fruits, vegetables, and lean proteins, and low in salt and simple carbohydrates) and exercise (at least 30 minutes of moderate physical activity daily).  Educational handout given for calorie counting for weight management  The above assessment and management plan was discussed with the patient. The patient verbalized understanding of and has agreed to the management plan. Patient is aware to call the clinic if they develop any new symptoms or if symptoms persist or worsen. Patient is aware when to return to the clinic for a follow-up visit. Patient educated on when it is appropriate to go to the emergency department.   MMonia Pouch FNP-C [facility] [phone]

## 2022-10-11 NOTE — Telephone Encounter (Signed)
Pt is returning call. Transferred to Mindi Junker, Charity fundraiser.

## 2022-10-12 ENCOUNTER — Encounter: Payer: Self-pay | Admitting: Family Medicine

## 2022-10-12 NOTE — Progress Notes (Signed)
Patient returning call. Please call back

## 2022-10-16 ENCOUNTER — Telehealth (HOSPITAL_COMMUNITY): Payer: Medicaid Other | Admitting: Psychiatry

## 2022-10-16 ENCOUNTER — Encounter (HOSPITAL_COMMUNITY): Payer: Self-pay

## 2022-10-19 ENCOUNTER — Encounter: Payer: Self-pay | Admitting: Nurse Practitioner

## 2022-10-19 ENCOUNTER — Ambulatory Visit (INDEPENDENT_AMBULATORY_CARE_PROVIDER_SITE_OTHER): Payer: Medicaid Other | Admitting: Nurse Practitioner

## 2022-10-19 VITALS — BP 164/102 | HR 103 | Temp 97.5°F | Resp 20 | Ht 59.0 in | Wt 243.0 lb

## 2022-10-19 DIAGNOSIS — R300 Dysuria: Secondary | ICD-10-CM | POA: Diagnosis not present

## 2022-10-19 LAB — URINALYSIS, COMPLETE
Bilirubin, UA: NEGATIVE
Glucose, UA: NEGATIVE
Ketones, UA: NEGATIVE
Nitrite, UA: NEGATIVE
Specific Gravity, UA: 1.02 (ref 1.005–1.030)
Urobilinogen, Ur: 0.2 mg/dL (ref 0.2–1.0)
pH, UA: 6.5 (ref 5.0–7.5)

## 2022-10-19 LAB — MICROSCOPIC EXAMINATION
RBC, Urine: NONE SEEN /hpf (ref 0–2)
Renal Epithel, UA: NONE SEEN /hpf

## 2022-10-19 MED ORDER — NITROFURANTOIN MONOHYD MACRO 100 MG PO CAPS: 100.0000 mg | ORAL_CAPSULE | Freq: Two times a day (BID) | ORAL | 0 refills | Status: DC

## 2022-10-19 NOTE — Progress Notes (Signed)
=  Subjective:    Patient ID: Molly [name], female    DOB: [birth year], 21 y.o.   MRN: [id number]   Chief Complaint: dysuria and vaginal itching  Dysuria  This is a new problem. The current episode started in the past 7 days. The problem occurs every urination. The problem has been unchanged. The quality of the pain is described as burning. The pain is moderate. There has been no fever. She is Not sexually active. There is No history of pyelonephritis. Associated symptoms include frequency. Pertinent negatives include no discharge, flank pain, hematuria, hesitancy, nausea, urgency or vomiting. Associated symptoms comments: Abdominal cramping which she states is from a "false period"; also reports burning in her abdomen when she urinates.. She has tried nothing for the symptoms.  Vaginal Itching The patient's primary symptoms include genital itching. The patient's pertinent negatives include no genital lesions or vaginal discharge. This is a new problem. The current episode started in the past 7 days. The problem occurs daily. The problem has been unchanged. The patient is experiencing no pain. The problem affects both sides. She is not pregnant. Associated symptoms include dysuria and frequency. Pertinent negatives include no fever, flank pain, hematuria, nausea, urgency or vomiting. Nothing aggravates the symptoms. Treatments tried: has been using hydrocortisone cream. The treatment provided no relief. She is not sexually active. She uses oral contraceptives for contraception. Menstrual history: has "false periods"       Review of Systems  Constitutional:  Negative for fatigue and fever.  Respiratory:  Negative for shortness of breath.   Gastrointestinal:  Negative for nausea and vomiting.       Abdominal cramping  Genitourinary:  Positive for dysuria and frequency. Negative for flank pain, hematuria, hesitancy, urgency, vaginal bleeding and vaginal discharge.       Vaginal redness        Objective:   Physical Exam Vitals and nursing note reviewed.  Constitutional:      General: She is not in acute distress.    Appearance: She is well-developed. She is not toxic-appearing.  Cardiovascular:     Rate and Rhythm: Regular rhythm. Tachycardia present.     Pulses: Normal pulses.     Heart sounds: Normal heart sounds.  Pulmonary:     Effort: Pulmonary effort is normal. No respiratory distress.     Breath sounds: Normal breath sounds.  Abdominal:     General: Bowel sounds are normal. There is no distension.     Palpations: Abdomen is soft.     Tenderness: There is no abdominal tenderness. There is no right CVA tenderness, left CVA tenderness, guarding or rebound.  Skin:    General: Skin is warm and dry.  Neurological:     General: No focal deficit present.     Mental Status: She is alert and oriented to person, place, and time.  Psychiatric:        Mood and Affect: Mood normal.        Behavior: Behavior normal.      BP (!) 164/102   Pulse (!) 103   Temp (!) 97.5 F (36.4 C) (Temporal)   Resp 20   Ht 4\' 11"  (1.499 m)   Wt 243 lb (110.2 kg)   SpO2 100%   BMI 49.08 kg/m       Assessment & Plan:    Molly [name] in today with chief complaint of Abdominal Pain   1. Dysuria Take medication as prescribe Cotton underwear Take shower not bath Cranberry  juice, yogurt Force fluids AZO over the counter X2 days Culture pending RTO prn  - Urinalysis, Complete - Urine Culture  Meds ordered this encounter  Medications   nitrofurantoin, macrocrystal-monohydrate, (MACROBID) 100 MG capsule    Sig: Take 1 capsule (100 mg total) by mouth 2 (two) times daily. 1 po BId    Dispense:  14 capsule    Refill:  0    Order Specific Question:   Supervising Provider    Answer:   Caryl Pina A [id number]     The above assessment and management plan was discussed with the patient. The patient verbalized understanding of and has agreed to the  management plan. Patient is aware to call the clinic if symptoms persist or worsen. Patient is aware when to return to the clinic for a follow-up visit. Patient educated on when it is appropriate to go to the emergency department.   Mary-Margaret Hassell Done, FNP

## 2022-10-19 NOTE — Patient Instructions (Signed)
Take medication as prescribe Cotton underwear Take shower not bath Cranberry juice, yogurt Force fluids AZO over the counter X2 days Culture pending RTO prn  

## 2022-10-21 LAB — URINE CULTURE

## 2022-10-23 LAB — HORMONE PANEL (T4,TSH,FSH,TESTT,SHBG,DHEA,ETC)
DHEA-Sulfate, LCMS: 136 ug/dL
Estradiol, Serum, MS: 14 pg/mL
Estrone Sulfate: 35 ng/dL
Follicle Stimulating Hormone: 2.1 m[IU]/mL
Free T-3: 3.4 pg/mL
Free Testosterone, Serum: 3 pg/mL
Progesterone, Serum: <10 ng/dL
Sex Hormone Binding Globulin: 12.1 nmol/L — ABNORMAL LOW
T4: 6.3 ug/dL
TSH: 3.4 uU/mL
Testosterone, Serum (Total): 12 ng/dL
Testosterone-% Free: 2.5 %
Triiodothyronine (T-3), Serum: 140 ng/dL

## 2022-10-23 LAB — LIPID PANEL
Chol/HDL Ratio: 5.2 ratio — ABNORMAL HIGH (ref 0.0–4.4)
Cholesterol, Total: 191 mg/dL — ABNORMAL HIGH (ref 100–169)
HDL: 37 mg/dL — ABNORMAL LOW (ref >39)
LDL Chol Calc (NIH): 121 mg/dL — ABNORMAL HIGH (ref 0–109)
Triglycerides: 188 mg/dL — ABNORMAL HIGH (ref 0–89)
VLDL Cholesterol Cal: 33 mg/dL (ref 5–40)

## 2022-10-23 LAB — CMP14+EGFR
ALT: 34 IU/L — ABNORMAL HIGH (ref 0–32)
AST: 24 IU/L (ref 0–40)
Albumin/Globulin Ratio: 2.4 — ABNORMAL HIGH (ref 1.2–2.2)
Albumin: 4.3 g/dL (ref 4.0–5.0)
Alkaline Phosphatase: 76 IU/L (ref 42–106)
BUN/Creatinine Ratio: 9 (ref 9–23)
BUN: 5 mg/dL — ABNORMAL LOW (ref 6–20)
Bilirubin Total: <0.2 mg/dL (ref 0.0–1.2)
CO2: 21 mmol/L (ref 20–29)
Calcium: 9.3 mg/dL (ref 8.7–10.2)
Chloride: 107 mmol/L — ABNORMAL HIGH (ref 96–106)
Creatinine, Ser: 0.57 mg/dL (ref 0.57–1.00)
Globulin, Total: 1.8 g/dL (ref 1.5–4.5)
Glucose: 170 mg/dL — ABNORMAL HIGH (ref 70–99)
Potassium: 4 mmol/L (ref 3.5–5.2)
Sodium: 142 mmol/L (ref 134–144)
Total Protein: 6.1 g/dL (ref 6.0–8.5)
eGFR: 134 mL/min/{1.73_m2} (ref >59)

## 2022-10-23 LAB — CBC WITH DIFFERENTIAL/PLATELET
Basophils Absolute: 0 10*3/uL (ref 0.0–0.2)
Basos: 0 %
EOS (ABSOLUTE): 0.1 10*3/uL (ref 0.0–0.4)
Eos: 2 %
Hematocrit: 42 % (ref 34.0–46.6)
Hemoglobin: 13.6 g/dL (ref 11.1–15.9)
Immature Grans (Abs): 0 10*3/uL (ref 0.0–0.1)
Immature Granulocytes: 0 %
Lymphocytes Absolute: 2 10*3/uL (ref 0.7–3.1)
Lymphs: 26 %
MCH: 30.4 pg (ref 26.6–33.0)
MCHC: 32.4 g/dL (ref 31.5–35.7)
MCV: 94 fL (ref 79–97)
Monocytes Absolute: 0.3 10*3/uL (ref 0.1–0.9)
Monocytes: 4 %
Neutrophils Absolute: 5.4 10*3/uL (ref 1.4–7.0)
Neutrophils: 68 %
Platelets: 286 10*3/uL (ref 150–450)
RBC: 4.47 x10E6/uL (ref 3.77–5.28)
RDW: 13 % (ref 11.7–15.4)
WBC: 7.9 10*3/uL (ref 3.4–10.8)

## 2022-10-23 LAB — VITAMIN D 25 HYDROXY (VIT D DEFICIENCY, FRACTURES): Vit D, 25-Hydroxy: 35.6 ng/mL (ref 30.0–100.0)

## 2022-10-23 LAB — VITAMIN B12: Vitamin B-12: 466 pg/mL (ref 232–1245)

## 2022-10-27 ENCOUNTER — Other Ambulatory Visit: Payer: Self-pay | Admitting: Allergy & Immunology

## 2022-10-31 ENCOUNTER — Telehealth (INDEPENDENT_AMBULATORY_CARE_PROVIDER_SITE_OTHER): Payer: Medicaid Other | Admitting: Psychiatry

## 2022-10-31 ENCOUNTER — Encounter (HOSPITAL_COMMUNITY): Payer: Self-pay | Admitting: Psychiatry

## 2022-10-31 DIAGNOSIS — F333 Major depressive disorder, recurrent, severe with psychotic symptoms: Secondary | ICD-10-CM | POA: Diagnosis not present

## 2022-10-31 DIAGNOSIS — F902 Attention-deficit hyperactivity disorder, combined type: Secondary | ICD-10-CM | POA: Diagnosis not present

## 2022-10-31 MED ORDER — CARIPRAZINE HCL 1.5 MG PO CAPS: 1.5000 mg | ORAL_CAPSULE | Freq: Every day | ORAL | 2 refills | Status: DC

## 2022-10-31 MED ORDER — ATOMOXETINE HCL 60 MG PO CAPS: 60.0000 mg | ORAL_CAPSULE | Freq: Every day | ORAL | 2 refills | Status: DC

## 2022-10-31 MED ORDER — BUSPIRONE HCL 10 MG PO TABS: 10.0000 mg | ORAL_TABLET | Freq: Two times a day (BID) | ORAL | 2 refills | Status: DC

## 2022-10-31 MED ORDER — HYDROXYZINE PAMOATE 25 MG PO CAPS: 25.0000 mg | ORAL_CAPSULE | Freq: Every day | ORAL | 2 refills | Status: DC | PRN

## 2022-10-31 MED ORDER — SERTRALINE HCL 50 MG PO TABS: 75.0000 mg | ORAL_TABLET | Freq: Every day | ORAL | 2 refills | Status: DC

## 2022-10-31 MED ORDER — CLONAZEPAM 0.5 MG PO TABS: 0.5000 mg | ORAL_TABLET | Freq: Two times a day (BID) | ORAL | 2 refills | Status: DC | PRN

## 2022-10-31 NOTE — Progress Notes (Signed)
Virtual Visit via Telephone Note  I connected with [name] on 10/31/22 at  1:40 PM EST by telephone and verified that I am speaking with the correct person using two identifiers.  Location: Patient: home Provider: office   I discussed the limitations, risks, security and privacy concerns of performing an evaluation and management service by telephone and the availability of in person appointments. I also discussed with the patient that there may be a patient responsible charge related to this service. The patient expressed understanding and agreed to proceed.      I discussed the assessment and treatment plan with the patient. The patient was provided an opportunity to ask questions and all were answered. The patient agreed with the plan and demonstrated an understanding of the instructions.   The patient was advised to call back or seek an in-person evaluation if the symptoms worsen or if the condition fails to improve as anticipated.  I provided 15 minutes of non-face-to-face time during this encounter.   [name], MD  [facility] MD/PA/NP OP Progress Note  10/31/2022 2:05 PM ARIANNIE PENALOZA  MRN:  [id number]  Chief Complaint:  Chief Complaint  Patient presents with   Depression   Anxiety   Follow-up   HPI: This patient is a 21 year old female who lives with her mother mother's fianc and a younger brother in [place]. 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   The patient returns for follow-up after 2 months.  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  She is getting all B's in her classes.  Her focus is good.  She states that she has a new provider in primary care and they were questioning her polypharmacy particular psychiatric meds.  All of them seem to have helped her but we can probably cut things down a bit.  She states that the BuSpar helps but she only remembers to take it twice a day so  we will cut it down to twice daily.  She only uses the clonazepam as needed and the hydroxyzine as needed.  The Leafy Kindle has really helped with her mood swings and Zoloft has helped anxiety.  She needs to be on medication to help focus and the Strattera seems to take care of this.  Therefore feel that all of these are justified.  She agrees and would like to continue these as we discussed.  She is having workup now for her tachycardia and recently did a heart monitor. Visit Diagnosis:    ICD-10-CM   1. Attention deficit hyperactivity disorder (ADHD), combined type  F90.2     2. Severe episode of recurrent major depressive disorder, with psychotic features (HCC)  F33.3       Past Psychiatric History: Prior psychiatric hospitalizations, 2 years of [facility]  Past Medical History:  Past Medical History:  Diagnosis Date   Acid reflux 2010   Acne vulgaris 2017   ADHD (attention deficit hyperactivity disorder) 2011   Adverse food reaction 07/11/2016   Shellfish allergy   Amenorrhea 2019   Anaphylactic shock due to adverse food reaction 01/15/2018   Anxiety 2017   Chronic constipation 09/05/2011   Chronic tension headaches 2011   originally followed by [facility]. Then Dr Alexis Goodell [facility] 03/2020 (see above)    Coalition, calcaneus navicular    Dysmenorrhea    Eczema    Eustachian tube dysfunction, left 10/23/2019   Frequent headaches  Galactorrhea    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 04/01/2019   Hiatal hernia    History of atrial septal defect repair 12/04/2011   Hyperlipidemia 2013   Hypertension 2011   Hypertension 09/28/2020   hypertension, cellulitus    Idiopathic urticaria 09/05/2011   Insomnia    Keratosis pilaris 01/15/2018   Mass of left thigh 11/22/2017   Overview:  Added automatically from request for surgery [id number]   MDD (major depressive disorder), severe (HCC) 03/06/2018   Migraine 2011   re-diagnosed as chronic migraines 03/2020 [facility] -  starting on preventative    Obesity without serious comorbidity with body mass index (BMI) in 95th to 98th percentile for age in pediatric patient 09/04/2018   Piriformis syndrome    POTS (postural orthostatic tachycardia syndrome)    PTSD (post-traumatic stress disorder)    Scoliosis 2019   Seasonal and perennial allergic rhinitis 2010   Severe persistent asthma without complication 2010   Sleep paralysis    Suicidal ideation 03/07/2018   Tic disorder 2019    Past Surgical History:  Procedure Laterality Date   ASD REPAIR  2012   with Helix, via Cardiac Catheterization   atrial septal defecgt     CARDIAC SURGERY     DENTAL SURGERY  2020   lipoma removal  2019   LUMBAR PUNCTURE  05/20/2021   TONSILLECTOMY AND ADENOIDECTOMY  2010    Family Psychiatric History: See below  Family History:  Family History  Problem Relation Age of Onset   Hypertension Mother    Hyperlipidemia Mother    Diabetes Mother    Depression Mother    Arthritis Mother    Allergic rhinitis Mother    Asthma Mother    Bipolar disorder Mother    Anxiety disorder Mother    Mental illness Mother    Miscarriages / [place] Mother    Thyroid disease Mother    Bipolar disorder Father    Drug abuse Father    Alcohol abuse Father    ADD / ADHD Father    Mental illness Father    Allergic rhinitis Brother    Asthma Brother    ADD / ADHD Brother    Learning disabilities Brother    Hypertension Maternal Grandmother    Hyperlipidemia Maternal Grandmother    Heart disease Maternal Grandmother    Bipolar disorder Maternal Grandfather    Angioedema Neg Hx    Eczema Neg Hx    Immunodeficiency Neg Hx    Urticaria Neg Hx     Social History:  Social History   Socioeconomic History   Marital status: Single    Spouse name: Not on file   Number of children: Not on file   Years of education: Not on file   Highest education level: Not on file  Occupational History   Not on file  Tobacco Use   Smoking  status: Never   Smokeless tobacco: Never  Vaping Use   Vaping Use: Never used  Substance and Sexual Activity   Alcohol use: No    Alcohol/week: 0.0 standard drinks of alcohol   Drug use: No   Sexual activity: Yes  Other Topics Concern   Not on file  Social History Narrative   Not on file   Social Determinants of Health   Financial Resource Strain: Not on file  Food Insecurity: Not on file  Transportation Needs: Not on file  Physical Activity: Not on file  Stress: Not on file  Social Connections: Not on file  Allergies:  Allergies  Allergen Reactions   Glucosamine Forte [Nutritional Supplements] Anaphylaxis    Due to containing shellfish   Nutritional Supplements Anaphylaxis   Oyster Extract Anaphylaxis   Shellfish-Derived Products Anaphylaxis    Throat swelling   Cats Claw (Uncaria Tomentosa) Nausea And Vomiting   Diamox [Acetazolamide] Other (See Comments)    Numbness- feet, hands, lips   Lactose    Lactose Intolerance (Gi)    Topiramate Other (See Comments)    Numbness to face, hands and feet   Bromfed Dm [Pseudoeph-Bromphen-Dm] Anxiety    CARBOFED DM    Metabolic Disorder Labs: Lab Results  Component Value Date   HGBA1C 5.8 (H) 10/11/2022   MPG 105.41 03/07/2018   Lab Results  Component Value Date   PROLACTIN 49.7 (H) 03/07/2018   Lab Results  Component Value Date   CHOL 191 (H) 10/11/2022   TRIG 188 (H) 10/11/2022   HDL 37 (L) 10/11/2022   CHOLHDL 5.2 (H) 10/11/2022   VLDL 20 03/07/2018   LDLCALC 121 (H) 10/11/2022   LDLCALC 138 (H) 03/06/2022   Lab Results  Component Value Date   TSH 1.550 03/06/2022   TSH 2.434 03/07/2018    Therapeutic Level Labs: No results found for: "LITHIUM" No results found for: "VALPROATE" No results found for: "CBMZ"  Current Medications: Current Outpatient Medications  Medication Sig Dispense Refill   albuterol (PROVENTIL) (2.5 MG/3ML) 0.083% nebulizer solution Take 3 mLs (2.5 mg total) by nebulization  every 6 (six) hours as needed for wheezing or shortness of breath. 75 mL 1   ascorbic acid (VITAMIN C) 500 MG tablet Take 500 mg by mouth daily.     atomoxetine (STRATTERA) 60 MG capsule Take 1 capsule (60 mg total) by mouth daily. 30 capsule 2   Azelastine-Fluticasone 137-50 MCG/ACT SUSP Place 1 spray into both nostrils 2 (two) times daily. 23 g 5   bisoprolol (ZEBETA) 5 MG tablet Take 1 tablet (5 mg total) by mouth daily. 90 tablet 3   budesonide-formoterol (SYMBICORT) 160-4.5 MCG/ACT inhaler Inhale 2 puffs into the lungs in the morning and at bedtime. 1 each 5   busPIRone (BUSPAR) 10 MG tablet Take 1 tablet (10 mg total) by mouth 2 (two) times daily. 60 tablet 2   calcium carbonate (OS-CAL) 1250 (500 Ca) MG chewable tablet Chew by mouth.     cariprazine (VRAYLAR) 1.5 MG capsule Take 1 capsule (1.5 mg total) by mouth daily. 30 capsule 2   Cholecalciferol 25 MCG (1000 UT) tablet Take 1 Units by mouth daily at 2 PM.     clobetasol (TEMOVATE) 0.05 % external solution APPLY TO AFFECTED AREA TWICE A DAY 50 mL 3   clonazePAM (KLONOPIN) 0.5 MG tablet Take 1 tablet (0.5 mg total) by mouth 2 (two) times daily as needed for anxiety. 60 tablet 2   EPINEPHrine 0.3 mg/0.3 mL IJ SOAJ injection Inject 0.3 mg into the muscle as needed for anaphylaxis. 2 each 2   fexofenadine (ALLEGRA) 180 MG tablet Take 1 tablet (180 mg total) by mouth daily. 30 tablet 5   Fluocinolone Acetonide 0.01 % OIL Place in ear(s).     Galcanezumab-gnlm 120 MG/ML SOAJ Inject into the skin.     hydrOXYzine (VISTARIL) 25 MG capsule Take 1-2 capsules (25-50 mg total) by mouth daily as needed. 60 capsule 2   hyoscyamine (LEVSIN SL) 0.125 MG SL tablet Take by mouth.     LINZESS 72 MCG capsule Take 72 mcg by mouth daily.  methocarbamol (ROBAXIN) 500 MG tablet Take 1 tablet (500 mg total) by mouth every 8 (eight) hours as needed for muscle spasms. 30 tablet 0   metoprolol tartrate (LOPRESSOR) 25 MG tablet Take 1 tablet (25 mg total) by  mouth as needed. 15 tablet 1   montelukast (SINGULAIR) 10 MG tablet Take 1 tablet (10 mg total) by mouth daily. 30 tablet 5   Multiple Vitamin (MULTIVITAMIN) capsule Take by mouth.     nitrofurantoin, macrocrystal-monohydrate, (MACROBID) 100 MG capsule Take 1 capsule (100 mg total) by mouth 2 (two) times daily. 1 po BId 14 capsule 0   norethindrone (AYGESTIN) 5 MG tablet Take 5 mg by mouth daily.     nystatin cream (MYCOSTATIN) Apply 1 Application topically in the morning, at noon, in the evening, and at bedtime. 60 g 1   Olopatadine HCl 0.2 % SOLN Apply 1 drop to eye every morning.     ondansetron (ZOFRAN-ODT) 4 MG disintegrating tablet DISSOLVE ON TONGUE AT ONSET OF NAUSEA- 30 DAY SUPPLY 10 tablet 1   pantoprazole (PROTONIX) 40 MG tablet Take 1 tablet by mouth in the morning and at bedtime.     sertraline (ZOLOFT) 50 MG tablet Take 1.5 tablets (75 mg total) by mouth daily. 45 tablet 2   SUMAtriptan (IMITREX) 100 MG tablet Take by mouth.     VENTOLIN HFA 108 (90 Base) MCG/ACT inhaler Inhale 2 puffs into the lungs every 4 (four) hours as needed for wheezing or shortness of breath. 18 g 1   zinc gluconate 50 MG tablet Take 50 mg by mouth daily.     No current facility-administered medications for this visit.     Musculoskeletal: Strength & Muscle Tone: na Gait & Station: na Patient leans: N/A  Psychiatric Specialty Exam: Review of Systems  Cardiovascular:  Positive for palpitations.  Neurological:  Positive for headaches.  All other systems reviewed and are negative.   There were no vitals taken for this visit.There is no height or weight on file to calculate BMI.  General Appearance: NA  Eye Contact:  NA  Speech:  Clear and Coherent  Volume:  Normal  Mood:  Euthymic  Affect:  NA  Thought Process:  Goal Directed  Orientation:  Full (Time, Place, and Person)  Thought Content: WDL   Suicidal Thoughts:  No  Homicidal Thoughts:  No  Memory:  Immediate;   Good Recent;    Good Remote;   Good  Judgement:  Good  Insight:  Good  Psychomotor Activity:  Normal  Concentration:  Concentration: Good and Attention Span: Good  Recall:  Good  Fund of Knowledge: Good  Language: Good  Akathisia:  No  Handed:  Right  AIMS (if indicated): not done  Assets:  Communication Skills Desire for Improvement Resilience Social Support Talents/Skills  ADL's:  Intact  Cognition: WNL  Sleep:  Good   Screenings: GAD-7    Flowsheet Row Office Visit from 10[facility]11/2022 in [facility] Office Visit from 09/07/2022 in [facility] Visit from [facility]/2022 in [facility] Visit from 07/11/2022 in [facility] Office Visit from 04/04/2022 in [facility]  Total GAD-7 Score 4 0 1 2 2       PHQ2-9    Flowsheet Row Office Visit from 10/11/2022 in [facility] Office Visit from 09/07/2022 in [facility] Video [facility] from [facility]/2022 [facility] Office Visit from 08[facility]15/2022 in [facility] Office Visit from 07/11/2022 in [facility]  PHQ-2 Total Score 1 0 0 0 1  PHQ-9 Total Score 4 2 -- 2 3      Flowsheet Row Video Visit from 08/22/2022 in [facility] Video Visit from 06/23/2022 in [facility] Video Visit from 04/19/2022 in [facility]  C-SSRS RISK CATEGORY No Risk No Risk No Risk        Assessment and Plan: This patient is a 21 year old female with a history of depression anxiety possible bipolar disorder sleep difficulties and ADD.  She is doing well and will continue Strattera 60 mg daily for focus, Vraylar 1.5 mg daily for mood stabilization, Zoloft 75 mg daily for depression and anxiety.  She will cut down BuSpar to  10 mg twice daily for anxiety, use clonazepam 0.5 mg daily only as needed and hydroxyzine 25 to 50 mg at bedtime only as needed.  She will return to see me in 2 months.  Collaboration of Care: Collaboration of Care: Primary Care Provider AEB notes are shared with PCP on the epic system  Patient/Guardian was advised Release of Information must be obtained prior to any record release in order to collaborate their care with an outside provider. Patient/Guardian was advised if they have not already done so to contact the registration department to sign all necessary forms in order for [place] to release information regarding their care.   Consent: Patient/Guardian gives verbal consent for treatment and assignment of benefits for services provided during this visit. Patient/Guardian expressed understanding and agreed to proceed.    [name], MD 10/31/2022, 2:05 PM

## 2022-11-01 ENCOUNTER — Ambulatory Visit (INDEPENDENT_AMBULATORY_CARE_PROVIDER_SITE_OTHER): Payer: Medicaid Other | Admitting: Nurse Practitioner

## 2022-11-01 ENCOUNTER — Encounter: Payer: Self-pay | Admitting: Nurse Practitioner

## 2022-11-01 DIAGNOSIS — J0110 Acute frontal sinusitis, unspecified: Secondary | ICD-10-CM | POA: Diagnosis not present

## 2022-11-01 DIAGNOSIS — B3731 Acute candidiasis of vulva and vagina: Secondary | ICD-10-CM | POA: Diagnosis not present

## 2022-11-01 MED ORDER — AMOXICILLIN-POT CLAVULANATE 875-125 MG PO TABS: 1.0000 | ORAL_TABLET | Freq: Two times a day (BID) | ORAL | 0 refills | Status: DC

## 2022-11-01 MED ORDER — FLUCONAZOLE 150 MG PO TABS: 150.0000 mg | ORAL_TABLET | Freq: Once | ORAL | 0 refills | Status: AC

## 2022-11-01 NOTE — Progress Notes (Signed)
   Virtual Visit  Note Due to COVID-19 pandemic this visit was conducted virtually. This visit type was conducted due to national recommendations for restrictions regarding the COVID-19 Pandemic (e.g. social distancing, sheltering in place) in an effort to limit this patient's exposure and mitigate transmission in our community. All issues noted in this document were discussed and addressed.  A physical exam was not performed with this format.  I connected with Molly [name] on 11/01/22 at 1;50 am  by telephone and verified that I am speaking with the correct person using two identifiers. Molly [name] is currently located at home during visit. The provider, Daryll Drown, NP is located in their office at time of visit.  I discussed the limitations, risks, security and privacy concerns of performing an evaluation and management service by telephone and the availability of in person appointments. I also discussed with the patient that there may be a patient responsible charge related to this service. The patient expressed understanding and agreed to proceed.   History and Present Illness:  Sinusitis This is a recurrent problem. Episode onset: in the past 4 days. The problem has been gradually worsening since onset. There has been no fever. Associated symptoms include congestion, ear pain, headaches and a sore throat. Past treatments include acetaminophen. The treatment provided no relief.      Review of Systems  Constitutional:  Positive for malaise/fatigue. Negative for fever.  HENT:  Positive for congestion, ear pain and sore throat.   Respiratory: Negative.    Cardiovascular: Negative.   Genitourinary: Negative.   Skin: Negative.  Negative for itching and rash.  Neurological:  Positive for headaches.  All other systems reviewed and are negative.    Observations/Objective: Tele-visit patient is not in distress  Assessment and Plan: Patient presents with URI symptoms  with head pressure for a few days. Patient denies fever, nausea or vomiting. Advised patient to Take meds as prescribed - Use a cool mist humidifier  -Use saline nose sprays frequently -Force fluids -For fever or aches or pains- take Tylenol or ibuprofen. -If symptoms do not improve, she may need to be COVID tested to rule this out   Follow Up Instructions: Follow up with unresolved symptoms    I discussed the assessment and treatment plan with the patient. The patient was provided an opportunity to ask questions and all were answered. The patient agreed with the plan and demonstrated an understanding of the instructions.   The patient was advised to call back or seek an in-person evaluation if the symptoms worsen or if the condition fails to improve as anticipated.  The above assessment and management plan was discussed with the patient. The patient verbalized understanding of and has agreed to the management plan. Patient is aware to call the clinic if symptoms persist or worsen. Patient is aware when to return to the clinic for a follow-up visit. Patient educated on when it is appropriate to go to the emergency department.   Time call ended: 2:01 pm   I provided 11 minutes of  non face-to-face time during this encounter.    Daryll Drown, NP

## 2022-11-07 ENCOUNTER — Ambulatory Visit (INDEPENDENT_AMBULATORY_CARE_PROVIDER_SITE_OTHER): Payer: Medicaid Other

## 2022-11-07 ENCOUNTER — Ambulatory Visit (INDEPENDENT_AMBULATORY_CARE_PROVIDER_SITE_OTHER): Payer: Medicaid Other | Admitting: Family Medicine

## 2022-11-07 ENCOUNTER — Encounter: Payer: Self-pay | Admitting: Family Medicine

## 2022-11-07 VITALS — BP 124/83 | HR 86 | Temp 98.6°F | Ht 59.0 in | Wt 248.0 lb

## 2022-11-07 DIAGNOSIS — M25532 Pain in left wrist: Secondary | ICD-10-CM | POA: Diagnosis not present

## 2022-11-07 DIAGNOSIS — R202 Paresthesia of skin: Secondary | ICD-10-CM

## 2022-11-07 MED ORDER — PREDNISONE 20 MG PO TABS: 40.0000 mg | ORAL_TABLET | Freq: Every day | ORAL | 0 refills | Status: AC

## 2022-11-07 NOTE — Telephone Encounter (Signed)
Patient has been schedule for 11/17/2022 to receive her Covid Vaccine. 

## 2022-11-07 NOTE — Progress Notes (Signed)
Subjective:  Patient ID: Molly [name], female    DOB: 2001-02-12, 21 y.o.   MRN: [id number]  Patient Care Team: Baruch Gouty, FNP as PCP - General (Family Medicine) Ernst Bowler Gwenith Daily, MD as Consulting Physician (Allergy and Immunology) Cloria Spring, MD as Consulting Physician ([place]) Timoteo Gaul, MD as Consulting Physician (Neurology) Lillia Mountain, Cindee Lame, MD as Consulting Physician (Pediatric Gastroenterology)   Chief Complaint:  left wrist pain   HPI: Molly [name] is a 21 y.o. female presenting on 11/07/2022 for left wrist pain   Wrist Pain  The pain is present in the left wrist, left fingers and left hand. This is a recurrent problem. The current episode started more than 1 month ago. There has been no history of extremity trauma. The problem occurs intermittently. The problem has been waxing and waning. The quality of the pain is described as aching, burning and sharp. The pain is moderate. Associated symptoms include joint swelling, numbness and tingling. Pertinent negatives include no fever, inability to bear weight, itching, joint locking, limited range of motion or stiffness. The symptoms are aggravated by activity. She has tried acetaminophen for the symptoms. The treatment provided no relief.  Is an Chiropractor and types everyday.     Relevant past medical, surgical, family, and social history reviewed and updated as indicated.  Allergies and medications reviewed and updated. Data reviewed: Chart in Epic.   Past Medical History:  Diagnosis Date   Acid reflux 2010   Acne vulgaris 2017   ADHD (attention deficit hyperactivity disorder) 2011   Adverse food reaction 07/11/2016   Shellfish allergy   Amenorrhea 2019   Anaphylactic shock due to adverse food reaction 01/15/2018   Anxiety 2017   Chronic constipation 09/05/2011   Chronic tension headaches 2011   originally followed by [facility] K.Griffin. Then Dr  Consuello [facility] 03/2020 (see above)    Coalition, calcaneus navicular    Dysmenorrhea    Eczema    Eustachian tube dysfunction, left 10/23/2019   Frequent headaches    Galactorrhea    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 04/01/2019   Hiatal hernia    History of atrial septal defect repair 12/04/2011   Hyperlipidemia 2013   Hypertension 2011   Hypertension 09/28/2020   hypertension, cellulitus    Idiopathic urticaria 09/05/2011   Insomnia    Keratosis pilaris 01/15/2018   Mass of left thigh 11/22/2017   Overview:  Added automatically from request for surgery [id number]   MDD (major depressive disorder), severe ([place]) 03/06/2018   Migraine 2011   re-diagnosed as chronic migraines 03/2020 [facility] - starting on preventative    Obesity without serious comorbidity with body mass index (BMI) in 95th to 98th percentile for age in pediatric patient 09/04/2018   Piriformis syndrome    POTS (postural orthostatic tachycardia syndrome)    PTSD (post-traumatic stress disorder)    Scoliosis 2019   Seasonal and perennial allergic rhinitis 2010   Severe persistent asthma without complication [id number]   Sleep paralysis    Suicidal ideation 03/07/2018   Tic disorder 2019    Past Surgical History:  Procedure Laterality Date   ASD REPAIR  2012   with Helix, via Cardiac Catheterization   atrial septal defecgt     CARDIAC SURGERY     DENTAL SURGERY  2020   lipoma removal  2019   LUMBAR PUNCTURE  05/20/2021   TONSILLECTOMY AND ADENOIDECTOMY  2010    Social History  Socioeconomic History   Marital status: Single    Spouse name: Not on file   Number of children: Not on file   Years of education: Not on file   Highest education level: Not on file  Occupational History   Not on file  Tobacco Use   Smoking status: Never   Smokeless tobacco: Never  Vaping Use   Vaping Use: Never used  Substance and Sexual Activity   Alcohol use: No    Alcohol/week: 0.0 standard drinks of alcohol   Drug use:  No   Sexual activity: Yes  Other Topics Concern   Not on file  Social History Narrative   Not on file   Social Determinants of Health   Financial Resource Strain: Not on file  Food Insecurity: Not on file  Transportation Needs: Not on file  Physical Activity: Not on file  Stress: Not on file  Social Connections: Not on file  Intimate Partner Violence: Not on file    Outpatient Encounter Medications as of 11/07/2022  Medication Sig   albuterol (PROVENTIL) (2.5 MG/3ML) 0.083% nebulizer solution Take 3 mLs (2.5 mg total) by nebulization every 6 (six) hours as needed for wheezing or shortness of breath.   amoxicillin-clavulanate (AUGMENTIN) 875-125 MG tablet Take 1 tablet by mouth 2 (two) times daily.   ascorbic acid (VITAMIN C) 500 MG tablet Take 500 mg by mouth daily.   atomoxetine (STRATTERA) 60 MG capsule Take 1 capsule (60 mg total) by mouth daily.   Azelastine-Fluticasone 137-50 MCG/ACT SUSP Place 1 spray into both nostrils 2 (two) times daily.   bisoprolol (ZEBETA) 5 MG tablet Take 1 tablet (5 mg total) by mouth daily.   budesonide-formoterol (SYMBICORT) 160-4.5 MCG/ACT inhaler Inhale 2 puffs into the lungs in the morning and at bedtime.   busPIRone (BUSPAR) 10 MG tablet Take 1 tablet (10 mg total) by mouth 2 (two) times daily.   calcium carbonate (OS-CAL) 1250 (500 Ca) MG chewable tablet Chew by mouth.   cariprazine (VRAYLAR) 1.5 MG capsule Take 1 capsule (1.5 mg total) by mouth daily.   Cholecalciferol 25 MCG (1000 UT) tablet Take 1 Units by mouth daily at 2 PM.   clobetasol (TEMOVATE) 0.05 % external solution APPLY TO AFFECTED AREA TWICE A DAY   clonazePAM (KLONOPIN) 0.5 MG tablet Take 1 tablet (0.5 mg total) by mouth 2 (two) times daily as needed for anxiety.   EPINEPHrine 0.3 mg/0.3 mL IJ SOAJ injection Inject 0.3 mg into the muscle as needed for anaphylaxis.   fexofenadine (ALLEGRA) 180 MG tablet Take 1 tablet (180 mg total) by mouth daily.   Fluocinolone Acetonide 0.01 %  OIL Place in ear(s).   Galcanezumab-gnlm 120 MG/ML SOAJ Inject into the skin.   hydrOXYzine (VISTARIL) 25 MG capsule Take 1-2 capsules (25-50 mg total) by mouth daily as needed.   hyoscyamine (LEVSIN SL) 0.125 MG SL tablet Take by mouth.   LINZESS 72 MCG capsule Take 72 mcg by mouth daily.   methocarbamol (ROBAXIN) 500 MG tablet Take 1 tablet (500 mg total) by mouth every 8 (eight) hours as needed for muscle spasms.   metoprolol tartrate (LOPRESSOR) 25 MG tablet Take 1 tablet (25 mg total) by mouth as needed.   montelukast (SINGULAIR) 10 MG tablet Take 1 tablet (10 mg total) by mouth daily.   Multiple Vitamin (MULTIVITAMIN) capsule Take by mouth.   nitrofurantoin, macrocrystal-monohydrate, (MACROBID) 100 MG capsule Take 1 capsule (100 mg total) by mouth 2 (two) times daily. 1 po BId  norethindrone (AYGESTIN) 5 MG tablet Take 5 mg by mouth daily.   nystatin cream (MYCOSTATIN) Apply 1 Application topically in the morning, at noon, in the evening, and at bedtime.   Olopatadine HCl 0.2 % SOLN Apply 1 drop to eye every morning.   ondansetron (ZOFRAN-ODT) 4 MG disintegrating tablet DISSOLVE ON TONGUE AT ONSET OF NAUSEA- 30 DAY SUPPLY   pantoprazole (PROTONIX) 40 MG tablet Take 1 tablet by mouth in the morning and at bedtime.   predniSONE (DELTASONE) 20 MG tablet Take 2 tablets (40 mg total) by mouth daily with breakfast for 5 days.   RESTASIS 0.05 % ophthalmic emulsion 1 drop 2 (two) times daily.   sertraline (ZOLOFT) 50 MG tablet Take 1.5 tablets (75 mg total) by mouth daily.   SUMAtriptan (IMITREX) 100 MG tablet Take by mouth.   VENTOLIN HFA 108 (90 Base) MCG/ACT inhaler Inhale 2 puffs into the lungs every 4 (four) hours as needed for wheezing or shortness of breath.   zinc gluconate 50 MG tablet Take 50 mg by mouth daily.   No facility-administered encounter medications on file as of 11/07/2022.    Allergies  Allergen Reactions   Glucosamine Forte [Nutritional Supplements] Anaphylaxis    Due  to containing shellfish   Nutritional Supplements Anaphylaxis   Oyster Extract Anaphylaxis   Shellfish-Derived Products Anaphylaxis    Throat swelling   Cats Claw (Uncaria Tomentosa) Nausea And Vomiting   Diamox [Acetazolamide] Other (See Comments)    Numbness- feet, hands, lips   Lactose    Lactose Intolerance (Gi)    Topiramate Other (See Comments)    Numbness to face, hands and feet   Bromfed Dm [Pseudoeph-Bromphen-Dm] Anxiety    CARBOFED DM    Review of Systems  Constitutional:  Negative for activity change, appetite change, chills, diaphoresis, fatigue, fever and unexpected weight change.  HENT: Negative.    Eyes: Negative.   Respiratory:  Negative for cough, chest tightness and shortness of breath.   Cardiovascular:  Negative for chest pain, palpitations and leg swelling.  Gastrointestinal:  Negative for abdominal pain, blood in stool, constipation, diarrhea, nausea and vomiting.  Endocrine: Negative.   Genitourinary:  Negative for decreased urine volume, difficulty urinating, dysuria, frequency and urgency.  Musculoskeletal:  Positive for arthralgias and joint swelling. Negative for back pain, gait problem, myalgias, neck pain, neck stiffness and stiffness.  Skin: Negative.  Negative for itching.  Allergic/Immunologic: Negative.   Neurological:  Positive for tingling and numbness. Negative for dizziness, tremors, seizures, syncope, facial asymmetry, speech difficulty, weakness, light-headedness and headaches.  Hematological: Negative.   Psychiatric/Behavioral:  Negative for confusion, hallucinations, sleep disturbance and suicidal ideas.   All other systems reviewed and are negative.       Objective:  BP 124/83   Pulse 86   Temp 98.6 F (37 C)   Ht 4\' 11"  (1.499 m)   Wt 248 lb (112.5 kg)   SpO2 99%   BMI 50.09 kg/m    Wt Readings from Last 3 Encounters:  11/07/22 248 lb (112.5 kg) (>99 %, Z= 2.47)*  10/19/22 243 lb (110.2 kg) (>99 %, Z= 2.43)*  10/11/22 244 lb  (110.7 kg) (>99 %, Z= 2.44)*   * Growth percentiles are based on CDC (Girls, 2-20 Years) data.    Physical Exam Vitals and nursing note reviewed.  Constitutional:      General: She is not in acute distress.    Appearance: Normal appearance. She is morbidly obese. She is not ill-appearing, toxic-appearing or diaphoretic.  HENT:     Head: Normocephalic and atraumatic.     Mouth/Throat:     Mouth: Mucous membranes are moist.  Eyes:     Conjunctiva/sclera: Conjunctivae normal.     Pupils: Pupils are equal, round, and reactive to light.  Cardiovascular:     Rate and Rhythm: Normal rate and regular rhythm.     Pulses: Normal pulses.     Heart sounds: Normal heart sounds.  Musculoskeletal:     Left forearm: Normal.     Left wrist: Tenderness present. No swelling, deformity, effusion, lacerations, bony tenderness, snuff box tenderness or crepitus. Normal range of motion. Normal pulse.     Left hand: Normal.     Right lower leg: No edema.     Left lower leg: No edema.     Comments: Left wrist: positive Phalen and Tinel sign  Skin:    General: Skin is warm and dry.     Capillary Refill: Capillary refill takes less than 2 seconds.  Neurological:     General: No focal deficit present.     Mental Status: She is alert and oriented to person, place, and time.  Psychiatric:        Mood and Affect: Mood normal.        Behavior: Behavior normal.        Thought Content: Thought content normal.        Judgment: Judgment normal.     Results for orders placed or performed in visit on 10/19/22  Urine Culture   Specimen: Urine   UR  Result Value Ref Range   Urine Culture, Routine Final report (A)    Organism ID, Bacteria Comment (A)    ORGANISM ID, BACTERIA Comment   Microscopic Examination   Urine  Result Value Ref Range   WBC, UA 6-10 (A) 0 - 5 /hpf   RBC, Urine None seen 0 - 2 /hpf   Epithelial Cells (non renal) 0-10 0 - 10 /hpf   Renal Epithel, UA None seen None seen /hpf    Bacteria, UA Few (A) None seen/Few  Urinalysis, Complete  Result Value Ref Range   Specific Gravity, UA 1.020 1.005 - 1.030   pH, UA 6.5 5.0 - 7.5   Color, UA Yellow Yellow   Appearance Ur Clear Clear   Leukocytes,UA 2+ (A) Negative   Protein,UA 1+ (A) Negative/Trace   Glucose, UA Negative Negative   Ketones, UA Negative Negative   RBC, UA Trace (A) Negative   Bilirubin, UA Negative Negative   Urobilinogen, Ur 0.2 0.2 - 1.0 mg/dL   Nitrite, UA Negative Negative   Microscopic Examination See below:      X-Ray: left wrist: No acute findings. Preliminary x-ray reading by Monia Pouch, FNP-C, [facility].   Pertinent labs & imaging results that were available during my care of the patient were reviewed by me and considered in my medical decision making.  Assessment & Plan:  Diera was seen today for left wrist pain.  Diagnoses and all orders for this visit:  Left wrist pain Left hand paresthesia Likely carpal tunnel syndrome from reported symptoms and positive physical exam. Imaging without acute findings. Splint applied in office today. Will burst with steroids. Report new, worsening, or persistent symptoms. Follow up in 6 weeks for reevaluation, sooner if warranted.  -     DG Wrist Complete Left -     predniSONE (DELTASONE) 20 MG tablet; Take 2 tablets (40 mg total) by mouth daily with breakfast for 5  days.     Continue all other maintenance medications.  Follow up plan: Return in about 6 weeks (around 12/19/2022), or if symptoms worsen or fail to improve, for wrist pain.   Continue healthy lifestyle choices, including diet (rich in fruits, vegetables, and lean proteins, and low in salt and simple carbohydrates) and exercise (at least 30 minutes of moderate physical activity daily).  Educational handout given for wrist pain  The above assessment and management plan was discussed with the patient. The patient verbalized understanding of and has agreed to the management plan.  Patient is aware to call the clinic if they develop any new symptoms or if symptoms persist or worsen. Patient is aware when to return to the clinic for a follow-up visit. Patient educated on when it is appropriate to go to the emergency department.   Kari Baars, FNP-C [facility] [phone]

## 2022-11-09 ENCOUNTER — Other Ambulatory Visit: Payer: Self-pay | Admitting: Family Medicine

## 2022-11-09 DIAGNOSIS — B372 Candidiasis of skin and nail: Secondary | ICD-10-CM

## 2022-11-10 ENCOUNTER — Other Ambulatory Visit: Payer: Self-pay | Admitting: Family Medicine

## 2022-11-10 ENCOUNTER — Encounter: Payer: Self-pay | Admitting: Family Medicine

## 2022-11-10 DIAGNOSIS — R0683 Snoring: Secondary | ICD-10-CM

## 2022-11-15 ENCOUNTER — Other Ambulatory Visit: Payer: Self-pay | Admitting: Allergy & Immunology

## 2022-11-15 ENCOUNTER — Encounter: Payer: Self-pay | Admitting: Internal Medicine

## 2022-11-15 ENCOUNTER — Ambulatory Visit: Payer: Medicaid Other | Attending: Internal Medicine | Admitting: Internal Medicine

## 2022-11-15 VITALS — BP 134/81 | HR 125 | Ht 59.0 in | Wt 240.0 lb

## 2022-11-15 DIAGNOSIS — I951 Orthostatic hypotension: Secondary | ICD-10-CM | POA: Diagnosis not present

## 2022-11-15 DIAGNOSIS — I4719 Other supraventricular tachycardia: Secondary | ICD-10-CM

## 2022-11-15 NOTE — Telephone Encounter (Signed)
Spoke with patient.

## 2022-11-15 NOTE — Progress Notes (Signed)
Electrophysiology TeleHealth Note   Due to national recommendations of social distancing due to COVID 19, an audio/video telehealth visit is felt to be most appropriate for this patient at this time.  See MyChart message from today for the patient's consent to telehealth for [facility].   Date:  11/15/2022   ID:  Molly [name], DOB 01-25-2001, MRN [id number]  Location: patient's home  Provider location: [name], [place]  Evaluation Performed: Initial Evaluation  PCP:  Sonny Masters, FNP  Cardiologist:  None  Electrophysiologist:  None   Chief Complaint:  spells   History of Present Illness:    Molly [name] is a 21 y.o. female (Molly [name])who presents via Web designer for a telehealth visit today for hypertension early onset, tachy palpitations associate with exercise intolerance and lightheadedness and intermittent diaphoresis.  Normal echo interval COVID and history of anxiety and depression with 2 hospitalizations  Recurrent "attacks"  really bad>>diaphoretic, shaking, cold hands and feet, but flushed--'feeling like the heat is coming off of my body", residual migraine.    Metanephrines >> not done, interval UTI and then had bleeding per rectum and underwent Colonoscopy >> hemorrhoids.    Metoprolol changed to bisoprolol with improved tachypalpitations and improved BP and less fatigue.  LH with standing and sense of room spinning--also with positional changes like rolling over in bed.   No history of PCOS, testosterone levels were measured in 2019 and were normal.  Do not know if this is sufficient.  Asked her to follow-up with her PCP            The patient denies symptoms of fevers, chills, cough, or new SOB worrisome for COVID 19.    Past Medical History:  Diagnosis Date   Acid reflux 2010   Acne vulgaris 2017   ADHD (attention deficit hyperactivity disorder) 2011   Adverse food reaction 07/11/2016   Shellfish allergy    Amenorrhea 2019   Anaphylactic shock due to adverse food reaction 01/15/2018   Anxiety 2017   Chronic constipation 09/05/2011   Chronic tension headaches 2011   originally followed by [facility] K.Griffin. Then Dr Alexis Goodell [facility] 03/2020 (see above)    Coalition, calcaneus navicular    Dysmenorrhea    Eczema    Eustachian tube dysfunction, left 10/23/2019   Frequent headaches    Galactorrhea    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 04/01/2019   Hiatal hernia    History of atrial septal defect repair 12/04/2011   Hyperlipidemia 2013   Hypertension 2011   Hypertension 09/28/2020   hypertension, cellulitus    Idiopathic urticaria 09/05/2011   Insomnia    Keratosis pilaris 01/15/2018   Mass of left thigh 11/22/2017   Overview:  Added automatically from request for surgery [id number]   MDD (major depressive disorder), severe (HCC) 03/06/2018   Migraine 2011   re-diagnosed as chronic migraines 03/2020 [facility] - starting on preventative    Obesity without serious comorbidity with body mass index (BMI) in 95th to 98th percentile for age in pediatric patient 09/04/2018   Piriformis syndrome    POTS (postural orthostatic tachycardia syndrome)    PTSD (post-traumatic stress disorder)    Scoliosis 2019   Seasonal and perennial allergic rhinitis 2010   Severe persistent asthma without complication 2010   Sleep paralysis    Suicidal ideation 03/07/2018   Tic disorder 2019    Past Surgical History:  Procedure Laterality Date   ASD REPAIR  2012  with Helix, via Cardiac Catheterization   atrial septal defecgt     CARDIAC SURGERY     DENTAL SURGERY  2020   lipoma removal  2019   LUMBAR PUNCTURE  05/20/2021   TONSILLECTOMY AND ADENOIDECTOMY  2010    Current Outpatient Medications  Medication Sig Dispense Refill   albuterol (PROVENTIL) (2.5 MG/3ML) 0.083% nebulizer solution Take 3 mLs (2.5 mg total) by nebulization every 6 (six) hours as needed for wheezing or shortness of breath. 75 mL  1   amoxicillin-clavulanate (AUGMENTIN) 875-125 MG tablet Take 1 tablet by mouth 2 (two) times daily. 14 tablet 0   ascorbic acid (VITAMIN C) 500 MG tablet Take 500 mg by mouth daily.     atomoxetine (STRATTERA) 60 MG capsule Take 1 capsule (60 mg total) by mouth daily. 30 capsule 2   Azelastine-Fluticasone 137-50 MCG/ACT SUSP Place 1 spray into both nostrils 2 (two) times daily. 23 g 5   bisoprolol (ZEBETA) 5 MG tablet Take 1 tablet (5 mg total) by mouth daily. 90 tablet 3   budesonide-formoterol (SYMBICORT) 160-4.5 MCG/ACT inhaler Inhale 2 puffs into the lungs in the morning and at bedtime. 1 each 5   busPIRone (BUSPAR) 10 MG tablet Take 1 tablet (10 mg total) by mouth 2 (two) times daily. 60 tablet 2   calcium carbonate (OS-CAL) 1250 (500 Ca) MG chewable tablet Chew by mouth.     cariprazine (VRAYLAR) 1.5 MG capsule Take 1 capsule (1.5 mg total) by mouth daily. 30 capsule 2   Cholecalciferol 25 MCG (1000 UT) tablet Take 1 Units by mouth daily at 2 PM.     clobetasol (TEMOVATE) 0.05 % external solution APPLY TO AFFECTED AREA TWICE A DAY 50 mL 3   clonazePAM (KLONOPIN) 0.5 MG tablet Take 1 tablet (0.5 mg total) by mouth 2 (two) times daily as needed for anxiety. 60 tablet 2   EPINEPHrine 0.3 mg/0.3 mL IJ SOAJ injection Inject 0.3 mg into the muscle as needed for anaphylaxis. 2 each 2   fexofenadine (ALLEGRA) 180 MG tablet Take 1 tablet (180 mg total) by mouth daily. 30 tablet 5   Fluocinolone Acetonide 0.01 % OIL Place in ear(s).     Galcanezumab-gnlm 120 MG/ML SOAJ Inject into the skin.     hydrOXYzine (VISTARIL) 25 MG capsule Take 1-2 capsules (25-50 mg total) by mouth daily as needed. 60 capsule 2   hyoscyamine (LEVSIN SL) 0.125 MG SL tablet Take by mouth.     LINZESS 72 MCG capsule Take 72 mcg by mouth daily.     methocarbamol (ROBAXIN) 500 MG tablet Take 1 tablet (500 mg total) by mouth every 8 (eight) hours as needed for muscle spasms. 30 tablet 0   metoprolol tartrate (LOPRESSOR) 25 MG  tablet Take 1 tablet (25 mg total) by mouth as needed. 15 tablet 1   montelukast (SINGULAIR) 10 MG tablet Take 1 tablet (10 mg total) by mouth daily. 30 tablet 5   Multiple Vitamin (MULTIVITAMIN) capsule Take by mouth.     norethindrone (AYGESTIN) 5 MG tablet Take 5 mg by mouth daily.     nystatin cream (MYCOSTATIN) APPLY TO AFFECTED AREA TWICE A DAY 60 g 1   Olopatadine HCl 0.2 % SOLN Apply 1 drop to eye every morning.     ondansetron (ZOFRAN-ODT) 4 MG disintegrating tablet DISSOLVE ON TONGUE AT ONSET OF NAUSEA- 30 DAY SUPPLY 10 tablet 1   pantoprazole (PROTONIX) 40 MG tablet Take 1 tablet by mouth in the morning and at bedtime.  RESTASIS 0.05 % ophthalmic emulsion 1 drop 2 (two) times daily.     sertraline (ZOLOFT) 50 MG tablet Take 1.5 tablets (75 mg total) by mouth daily. 45 tablet 2   SUMAtriptan (IMITREX) 100 MG tablet Take by mouth.     VENTOLIN HFA 108 (90 Base) MCG/ACT inhaler Inhale 2 puffs into the lungs every 4 (four) hours as needed for wheezing or shortness of breath. 18 g 1   No current facility-administered medications for this visit.    Allergies:   Glucosamine forte [nutritional supplements], Nutritional supplements, Oyster extract, Shellfish-derived products, Cats claw (uncaria tomentosa), Diamox [acetazolamide], Lactose, Lactose intolerance (gi), Topiramate, and Bromfed dm [pseudoeph-bromphen-dm]   Social History:  The patient  reports that she has never smoked. She has never used smokeless tobacco. She reports that she does not drink alcohol and does not use drugs.   Family History:  The patient's   family history includes ADD / ADHD in her brother and father; Alcohol abuse in her father; Allergic rhinitis in her brother and mother; Anxiety disorder in her mother; Arthritis in her mother; Asthma in her brother and mother; Bipolar disorder in her father, maternal grandfather, and mother; Depression in her mother; Diabetes in her mother; Drug abuse in her father; Heart  disease in her maternal grandmother; Hyperlipidemia in her maternal grandmother and mother; Hypertension in her maternal grandmother and mother; Learning disabilities in her brother; Mental illness in her father and mother; Miscarriages / [place] in her mother; Thyroid disease in her mother.   ROS:  Please see the history of present illness.   All other systems are personally reviewed and negative.    Exam:    Vital Signs:  BP 134/81 Comment: pt reported  Pulse (!) 125   Ht 4\' 11"  (1.499 m)   Wt 240 lb (108.9 kg)   BMI 48.47 kg/m        Labs/Other Tests and Data Reviewed:    Recent Labs: 03/06/2022: TSH 1.550 10/11/2022: ALT 34; BUN 5; Creatinine, Ser 0.57; Hemoglobin 13.6; Platelets 286; Potassium 4.0; Sodium 142   Wt Readings from Last 3 Encounters:  11/15/22 240 lb (108.9 kg) (>99 %, Z= 2.41)*  11/07/22 248 lb (112.5 kg) (>99 %, Z= 2.47)*  10/19/22 243 lb (110.2 kg) (>99 %, Z= 2.43)*   * Growth percentiles are based on CDC (Girls, 2-20 Years) data.     Other studies personally reviewed:  (As above)    ASSESSMENT & PLAN:   Orthostatic intolerance   Hypertension   Atrial tachycardia   ASD repair   Morbid obesity   Depression-severe-better now   Asthma   Sleep disordered breathing  Vertigo   Diaphoresis    Better on the bisoprolol we will continue.  Has blood pressure evaluation scheduled with Dr. 10/21/22 next week.  Unfortunately, urine catecholamines have not been done.  Have encouraged her to try and get them completed.  Also encouraged her to follow-up with lumbar pulmonary regarding sleep study.  In anticipation of her visit with Dr. Armanda Heritage, I have asked her to check her blood pressure each morning, and if she has no one of her "attacks "to check the blood pressure again, certainly, if her blood pressure elevated that would be helpful.  Diaphoresis is really limiting.  In the event that we do not find any contraindications, couple of people have been  treated with Robinul with great success with this.  I suspect some of her dizziness is vertiginous as it is aggravated by rotation  of her head unassociated with nausea     COVID 19 screen The patient denies symptoms of COVID 19 at this time.  The importance of social distancing was discussed today.  Follow-up:  6 w    Current medicines are reviewed at length with the patient today.   The patient  concerns regarding her medicines.  The following changes were made today:    Labs/ tests ordered today include:  No orders of the defined types were placed in this encounter.   Future tests ( post COVID )   in   months  Patient Risk:  after full review of this patients clinical status, I feel that they are at moderate risk at this time.  Today, I have spent 21 minutes with the patient with telehealth technology discussing (As above)  .    Signed, Sherryl Manges, MD  11/15/2022 4:59 PM     [facility] [address] [place] [address] [phone] (office) [phone] (fax)

## 2022-11-15 NOTE — Patient Instructions (Signed)
Medication Instructions:  Your physician recommends that you continue on your current medications as directed. Please refer to the Current Medication list given to you today.  *If you need a refill on your cardiac medications before your next appointment, please call your pharmacy*   Lab Work: None ordered.  If you have labs (blood work) drawn today and your tests are completely normal, you will receive your results only by: MyChart Message (if you have MyChart) OR A paper copy in the mail If you have any lab test that is abnormal or we need to change your treatment, we will call you to review the results.   Testing/Procedures: None ordered.    Follow-Up: At [facility], you and your health needs are our priority.  As part of our continuing mission to provide you with exceptional heart care, we have created designated Provider Care Teams.  These Care Teams include your primary Cardiologist (physician) and Advanced Practice Providers (APPs -  Physician Assistants and Nurse Practitioners) who all work together to provide you with the care you need, when you need it.  We recommend signing up for the patient portal called "MyChart".  Sign up information is provided on this After Visit Summary.  MyChart is used to connect with patients for Virtual Visits (Telemedicine).  Patients are able to view lab/test results, encounter notes, upcoming appointments, etc.  Non-urgent messages can be sent to your provider as well.   To learn more about what you can do with MyChart, go to [email or web address].    Your next appointment:    Virtual visit in 6 weeks with Dr Graciela Husbands - Will have his scheduler call you with this appointment.   Important Information About Sugar

## 2022-11-17 ENCOUNTER — Ambulatory Visit (INDEPENDENT_AMBULATORY_CARE_PROVIDER_SITE_OTHER): Payer: Medicaid Other

## 2022-11-17 DIAGNOSIS — Z23 Encounter for immunization: Secondary | ICD-10-CM | POA: Diagnosis not present

## 2022-11-20 ENCOUNTER — Other Ambulatory Visit: Payer: Medicaid Other

## 2022-11-20 ENCOUNTER — Encounter: Payer: Self-pay | Admitting: Family Medicine

## 2022-11-21 NOTE — Progress Notes (Signed)
[facility] Initial Assessment:    Date:  11/22/2022   ID:  Molly [name], DOB 01-20-2001, MRN [id number]  PCP:  Sonny Mastersakes, Linda M, FNP  Cardiologist:  None  Nephrologist:  Referring MD: Duke SalviaKlein, Steven C, MD   CC: Hypertension  History of Present Illness:    Molly GobbleKatherine M Wordell is a 21 y.o. female with a hx of ASD s/p repair 2012, POTS, hypertension, hyperlipidemia, asthma, migraines, gastritis, hiatal hernia, scoliosis, obesity, ADHD, PTSD, anxiety, depression, and suicidal ideation, here to establish care in the [facility]. She previously saw Dr. Graciela HusbandsKlein for palpitations and intermittent attacks of diaphoresis with shaking and cold hands, but feeling like she was flushed. Metanephrines were ordered but not performed. Thyroid function was normal. She wore a monitor 10/2022 that showed an average heart rate of 109 with rare PACs and PVCs. Symptomatic fluttering was associated with sinus tachycardia. Her blood pressure in the office with him was 184/81. Her palpitations and blood pressure improved with switching metoprolol to bisoprolol. She was referred to [facility].  Today, she is accompanied by a family member. She reports that she was initially followed for hypertension at an early age following her ASD repair. In clinic her blood pressure is 118/82. She presents a BP log which is personally reviewed and shows 110s-150s systolic on average. Her readings have improved since starting bisoprolol. Her heart rate is typically elevated in the 100s-120s, monitored by her pulse ox and smart watch. In the past her heart rate has been as high as the 180s. She has been struggling with intermittent "attacks" worse since 2021 that are associated with diaphoresis, elevated heart rates, palpitations that feel like her heart will come out of her chest, dizziness, and imbalance. In college she may almost fall over after getting up from bed and trying to  walk to her desk. When she helps with vacuuming, she has to sit down after a short time due to myalgias, migraines, diaphoresis, and palpitations. Since her initial COVID infection her symptoms have been worse. In her life she endorses 3 syncopal episodes. Once she passed out after leaving the restroom and injured her arm. Recently she required assistance to get out of the tub as she was experiencing presyncope with tunnel vision, shaking, racing heart rates, and imbalance. Her symptoms make it a struggle to exercise. She used to love to walk but now this is miserable. After walking through a store, her face will be hot but hands cold, and she will be drenched with sweat. It has been suggested to her that she focus on horizontal exercises. Regarding her diet, she usually cooks at home and only occasionally orders out. Frequently chicken and doesn't eat fried foods. She is not always able to finish her meals due to a history of gastroparesis, hiatal hernia, and acid reflux. Avoids energy drinks and caffeine. She does not drink alcohol or smoke. She endorses snoring. She does not feel rested in the mornings. She is scheduled for a consultation regarding a sleep study. For a few years she has been on Strattera, which she takes at nighttime as she reported it caused drowsiness. At 21 yo her pediatrician diagnosed her with POTS. Her symptoms included palpitations, chest pain, dizziness, and shortness of breath. In middle school, she had a stress test that she was unable to complete. She also had prior workup for Cushing's in 2017-2018. In 2019 she went on antidepressants and gained more than 50 lbs. Prior to that her weight  was in the 160s. Also in 2019, she had severe LE edema of unknown etiology that lasted for a month. She developed burning sensations while walking, and swelling in her hands as well. To this day her hands continue to feel swollen at times. She denies any orthopnea, or PND.  Previous  antihypertensives: N/a  Past Medical History:  Diagnosis Date   Acid reflux 2010   Acne vulgaris 2017   ADHD (attention deficit hyperactivity disorder) 2011   Adverse food reaction 07/11/2016   Shellfish allergy   Amenorrhea 2019   Anaphylactic shock due to adverse food reaction 01/15/2018   Anxiety 2017   ASD (atrial septal defect) 11/22/2022   Chronic constipation 09/05/2011   Chronic tension headaches 2011   originally followed by [facility] K.Griffin. Then Dr Alexis Goodell [facility] 03/2020 (see above)    Coalition, calcaneus navicular    Dysmenorrhea    Eczema    Eustachian tube dysfunction, left 10/23/2019   Frequent headaches    Galactorrhea    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 04/01/2019   Hiatal hernia    History of atrial septal defect repair 12/04/2011   Hyperlipidemia 2013   Hypertension 2011   Hypertension 09/28/2020   hypertension, cellulitus    Idiopathic urticaria 09/05/2011   Insomnia    Keratosis pilaris 01/15/2018   Mass of left thigh 11/22/2017   Overview:  Added automatically from request for surgery [id number]   MDD (major depressive disorder), severe (HCC) 03/06/2018   Migraine 2011   re-diagnosed as chronic migraines 03/2020 [facility] - starting on preventative    Obesity without serious comorbidity with body mass index (BMI) in 95th to 98th percentile for age in pediatric patient 09/04/2018   Piriformis syndrome    POTS (postural orthostatic tachycardia syndrome)    PTSD (post-traumatic stress disorder)    Scoliosis 2019   Seasonal and perennial allergic rhinitis 2010   Severe persistent asthma without complication 2010   Sleep paralysis    Suicidal ideation 03/07/2018   Tic disorder 2019    Past Surgical History:  Procedure Laterality Date   ASD REPAIR  2012   with Helix, via Cardiac Catheterization   atrial septal defecgt     CARDIAC SURGERY     DENTAL SURGERY  2020   lipoma removal  2019   LUMBAR PUNCTURE  05/20/2021   TONSILLECTOMY AND  ADENOIDECTOMY  2010    Current Medications: Current Meds  Medication Sig   albuterol (PROVENTIL) (2.5 MG/3ML) 0.083% nebulizer solution Take 3 mLs (2.5 mg total) by nebulization every 6 (six) hours as needed for wheezing or shortness of breath.   ascorbic acid (VITAMIN C) 500 MG tablet Take 500 mg by mouth daily.   atomoxetine (STRATTERA) 60 MG capsule Take 1 capsule (60 mg total) by mouth daily.   Azelastine-Fluticasone 137-50 MCG/ACT SUSP Place 1 spray into both nostrils 2 (two) times daily.   budesonide-formoterol (SYMBICORT) 160-4.5 MCG/ACT inhaler Inhale 2 puffs into the lungs in the morning and at bedtime.   busPIRone (BUSPAR) 10 MG tablet Take 1 tablet (10 mg total) by mouth 2 (two) times daily.   calcium carbonate (OS-CAL) 1250 (500 Ca) MG chewable tablet Chew by mouth.   cariprazine (VRAYLAR) 1.5 MG capsule Take 1 capsule (1.5 mg total) by mouth daily.   Cholecalciferol 25 MCG (1000 UT) tablet Take 1 Units by mouth daily at 2 PM.   clobetasol (TEMOVATE) 0.05 % external solution APPLY TO AFFECTED AREA TWICE A DAY   clonazePAM (KLONOPIN) 0.5  MG tablet Take 1 tablet (0.5 mg total) by mouth 2 (two) times daily as needed for anxiety.   EPINEPHrine 0.3 mg/0.3 mL IJ SOAJ injection Inject 0.3 mg into the muscle as needed for anaphylaxis.   fexofenadine (ALLEGRA) 180 MG tablet Take 1 tablet (180 mg total) by mouth daily.   Fluocinolone Acetonide 0.01 % OIL Place in ear(s).   Galcanezumab-gnlm 120 MG/ML SOAJ Inject into the skin.   hydrOXYzine (VISTARIL) 25 MG capsule Take 1-2 capsules (25-50 mg total) by mouth daily as needed.   hyoscyamine (LEVSIN SL) 0.125 MG SL tablet Take by mouth.   LINZESS 72 MCG capsule Take 72 mcg by mouth daily.   methocarbamol (ROBAXIN) 500 MG tablet Take 1 tablet (500 mg total) by mouth every 8 (eight) hours as needed for muscle spasms.   metoprolol tartrate (LOPRESSOR) 25 MG tablet Take 1 tablet (25 mg total) by mouth as needed.   montelukast (SINGULAIR) 10 MG  tablet Take 1 tablet (10 mg total) by mouth daily.   Multiple Vitamin (MULTIVITAMIN) capsule Take by mouth.   norethindrone (AYGESTIN) 5 MG tablet Take 5 mg by mouth daily.   nystatin cream (MYCOSTATIN) APPLY TO AFFECTED AREA TWICE A DAY   Olopatadine HCl 0.2 % SOLN Apply 1 drop to eye every morning.   ondansetron (ZOFRAN-ODT) 4 MG disintegrating tablet DISSOLVE ON TONGUE AT ONSET OF NAUSEA- 30 DAY SUPPLY   pantoprazole (PROTONIX) 40 MG tablet Take 1 tablet by mouth in the morning and at bedtime.   RESTASIS 0.05 % ophthalmic emulsion 1 drop 2 (two) times daily.   sertraline (ZOLOFT) 50 MG tablet Take 1.5 tablets (75 mg total) by mouth daily.   SUMAtriptan (IMITREX) 100 MG tablet Take by mouth.   VENTOLIN HFA 108 (90 Base) MCG/ACT inhaler INHALE 2 PUFFS INTO THE LUNGS EVERY 4 HOURS AS NEEDED FOR WHEEZING OR SHORTNESS OF BREATH.   [DISCONTINUED] bisoprolol (ZEBETA) 5 MG tablet Take 1 tablet (5 mg total) by mouth daily.     Allergies:   Glucosamine forte [nutritional supplements], Nutritional supplements, Oyster extract, Shellfish-derived products, Cats claw (uncaria tomentosa), Diamox [acetazolamide], Lactose, Lactose intolerance (gi), Topiramate, and Bromfed dm [pseudoeph-bromphen-dm]   Social History   Socioeconomic History   Marital status: Single    Spouse name: Not on file   Number of children: Not on file   Years of education: Not on file   Highest education level: Not on file  Occupational History   Not on file  Tobacco Use   Smoking status: Never   Smokeless tobacco: Never  Vaping Use   Vaping Use: Never used  Substance and Sexual Activity   Alcohol use: No    Alcohol/week: 0.0 standard drinks of alcohol   Drug use: No   Sexual activity: Yes  Other Topics Concern   Not on file  Social History Narrative   Not on file   Social Determinants of Health   Financial Resource Strain: Low Risk  (11/22/2022)   Overall Financial Resource Strain (CARDIA)    Difficulty of Paying  Living Expenses: Not very hard  Food Insecurity: No Food Insecurity (11/22/2022)   Hunger Vital Sign    Worried About Running Out of Food in the Last Year: Never true    Ran Out of Food in the Last Year: Never true  Transportation Needs: No Transportation Needs (11/22/2022)   PRAPARE - Administrator, Civil Service (Medical): No    Lack of Transportation (Non-Medical): No  Physical Activity: Not  on file  Stress: Not on file  Social Connections: Not on file     Family History: The patient's family history includes ADD / ADHD in her brother and father; Alcohol abuse in her father; Allergic rhinitis in her brother and mother; Anxiety disorder in her mother; Arthritis in her mother; Asthma in her brother and mother; Bipolar disorder in her father, maternal grandfather, and mother; Depression in her mother; Diabetes in her mother; Drug abuse in her father; Heart attack in her maternal grandfather; Heart disease in her maternal grandmother; Hyperlipidemia in her maternal grandmother and mother; Hypertension in her maternal grandmother and mother; Learning disabilities in her brother; Mental illness in her father and mother; Miscarriages / [place] in her mother; Thyroid disease in her mother. There is no history of Angioedema, Eczema, Immunodeficiency, or Urticaria.  ROS:   Please see the history of present illness.    (+) Diaphoresis (+) Elevated heart rates (+) Palpitations (+) Dizziness (+) Imbalance (+) Myalgias (+) Migraines (+) Presyncope with tunnel vision and shaking (+) Acid reflux (+) Snoring (+) Edema of bilateral hands All other systems reviewed and are negative.  EKGs/Labs/Other Studies Reviewed:    Monitor 11/2022: Indication:palpitations    Duration: 5d   Findings HR  avg 109  Min 57-Max 152  PVCs Rare, less than 1%   PACs Rare, less than 1%     Symptoms:             fluttering>> sinus tach     Triggered: sinus tach               Conclusions: No significant arrhythmias There were different vector P waves of the QRS morphologies but heart rates for the same See 6: 50: 44 strip versus 8: 14: 33 strip both on 11/9   Recommendations No significant arrhythmias.  Symptoms were associated with sinus tachycardia P wave morphology changes not suggestive of a primary arrhythmia    EKG:  EKG is personally reviewed. 11/22/2022: Sinus tachycardia. Rate 104 bpm.  Recent Labs: 03/06/2022: TSH 1.550 10/11/2022: ALT 34; BUN 5; Creatinine, Ser 0.57; Hemoglobin 13.6; Platelets 286; Potassium 4.0; Sodium 142   Recent Lipid Panel    Component Value Date/Time   CHOL 191 (H) 10/11/2022 1556   TRIG 188 (H) 10/11/2022 1556   HDL 37 (L) 10/11/2022 1556   CHOLHDL 5.2 (H) 10/11/2022 1556   CHOLHDL 3.3 03/07/2018 0713   VLDL 20 03/07/2018 0713   LDLCALC 121 (H) 10/11/2022 1556    Physical Exam:    VS:  BP 118/82 (BP Location: Right Arm, Patient Position: Sitting, Cuff Size: Large)   Pulse (!) 104   Ht 4\' 11"  (1.499 m)   Wt 248 lb 12.8 oz (112.9 kg)   BMI 50.25 kg/m  , BMI Body mass index is 50.25 kg/m. GENERAL:  Well appearing HEENT: Pupils equal round and reactive, fundi not visualized, oral mucosa unremarkable.  Round face NECK:  No jugular venous distention, waveform within normal limits, carotid upstroke brisk and symmetric, no bruits, no thyromegaly LUNGS:  Clear to auscultation bilaterally HEART:  RRR.  PMI not displaced or sustained,S1 and S2 within normal limits, no S3, no S4, no clicks, no rubs, no murmurs ABD:  Flat, positive bowel sounds normal in frequency in pitch, no bruits, no rebound, no guarding, no midline pulsatile mass, no hepatomegaly, no splenomegaly EXT:  2 plus pulses throughout, no edema, no cyanosis, no clubbing.  +hump between scapula SKIN:  No rashes, no nodules NEURO:  Cranial  nerves II through XII grossly intact, motor grossly intact throughout [facility]:  Cognitively intact, oriented to person place  and time   ASSESSMENT/PLAN:    Hypertension Blood pressure remains uncontrolled.  She is feeling better since starting bisoprolol.  However she continues to have episodes of tachycardia.  Urinary metanephrines are pending.  Based on this we may need to also do a 24-hour urine collection for catecholamines.  Will check for Cushing syndrome.  Will give dexamethasone 1 mg the night before then check a.m. cortisol levels.  Refer for sleep study.  She has been too symptomatic to work on exercise.  This and weight loss would likely also help with her blood pressures.  She is not orthostatic in the office today.  She was tachycardic in all positions without any significant change in blood pressure or heart rate with position.  We will try increasing her bisoprolol to 10 mg.  Recommended that she wear compression socks.  Continue checking blood pressures at home and bring to follow-up.  Thyroid function has been normal.  ASD (atrial septal defect) She has a history of an ASD previously repaired.  No echocardiograms in the system.  She reports exertional intolerance and swelling.  It is reasonable to get a new baseline echocardiogram.   Screening for Secondary Hypertension:     11/22/2022    3:55 PM  Causes  Drugs/Herbals Screened     - Comments No caffeine or energy drinks.    Relevant Labs/Studies:    Latest Ref Rng & Units 10/11/2022    3:56 PM 03/06/2022   11:02 AM 11/26/2019    9:46 AM  Basic Labs  Sodium 134 - 144 mmol/L 142  143  146   Potassium 3.5 - 5.2 mmol/L 4.0  4.5  4.2   Creatinine 0.57 - 1.00 mg/dL 4.58  0.99  8.33        Latest Ref Rng & Units 03/06/2022   11:02 AM 03/07/2018    7:13 AM  Thyroid   TSH 0.450 - 4.500 uIU/mL 1.550  2.434    Disposition:    FU with APP/PharmD in 1 month for the next 3 months.   FU with Dyami Umbach C. Duke Salvia, MD, [facility] in 4 months.  Medication Adjustments/Labs and Tests Ordered: Current medicines are reviewed at length with the patient today.   Concerns regarding medicines are outlined above.   Orders Placed This Encounter  Procedures   Cortisol   EKG 12-Lead   Meds ordered this encounter  Medications   bisoprolol (ZEBETA) 10 MG tablet    Sig: Take 1 tablet (10 mg total) by mouth daily.    Dispense:  90 tablet    Refill:  1    D/C 5 MG RX    I,Mathew Stumpf,acting as a scribe for Chilton Si, MD.,have documented all relevant documentation on the behalf of Chilton Si, MD,as directed by  Chilton Si, MD while in the presence of Chilton Si, MD.  I, Katria Botts C. Duke Salvia, MD have reviewed all documentation for this visit.  The documentation of the exam, diagnosis, procedures, and orders on 11/22/2022 are all accurate and complete.   Signed, Chilton Si, MD  11/22/2022 5:46 PM    [facility]

## 2022-11-22 ENCOUNTER — Encounter (HOSPITAL_BASED_OUTPATIENT_CLINIC_OR_DEPARTMENT_OTHER): Payer: Self-pay | Admitting: Cardiovascular Disease

## 2022-11-22 ENCOUNTER — Other Ambulatory Visit (HOSPITAL_BASED_OUTPATIENT_CLINIC_OR_DEPARTMENT_OTHER): Payer: Self-pay | Admitting: *Deleted

## 2022-11-22 ENCOUNTER — Ambulatory Visit (INDEPENDENT_AMBULATORY_CARE_PROVIDER_SITE_OTHER): Payer: Medicaid Other | Admitting: Cardiovascular Disease

## 2022-11-22 VITALS — BP 118/82 | HR 104 | Ht 59.0 in | Wt 248.8 lb

## 2022-11-22 DIAGNOSIS — I10 Essential (primary) hypertension: Secondary | ICD-10-CM | POA: Diagnosis not present

## 2022-11-22 DIAGNOSIS — Q211 Atrial septal defect, unspecified: Secondary | ICD-10-CM

## 2022-11-22 HISTORY — DX: Atrial septal defect, unspecified: Q21.10

## 2022-11-22 MED ORDER — DEXAMETHASONE 1 MG PO TABS: 1.0000 mg | ORAL_TABLET | Freq: Once | ORAL | 0 refills | Status: AC

## 2022-11-22 MED ORDER — BISOPROLOL FUMARATE 10 MG PO TABS: 10.0000 mg | ORAL_TABLET | Freq: Every day | ORAL | 1 refills | Status: DC

## 2022-11-22 NOTE — Assessment & Plan Note (Signed)
She has a history of an ASD previously repaired.  No echocardiograms in the system.  She reports exertional intolerance and swelling.  It is reasonable to get a new baseline echocardiogram.

## 2022-11-22 NOTE — Assessment & Plan Note (Addendum)
Blood pressure remains uncontrolled.  She is feeling better since starting bisoprolol.  However she continues to have episodes of tachycardia.  Urinary metanephrines are pending.  Based on this we may need to also do a 24-hour urine collection for catecholamines.  Will check for Cushing syndrome.  Will give dexamethasone 1 mg the night before then check a.m. cortisol levels.  Refer for sleep study.  She has been too symptomatic to work on exercise.  This and weight loss would likely also help with her blood pressures.  She is not orthostatic in the office today.  She was tachycardic in all positions without any significant change in blood pressure or heart rate with position.  We will try increasing her bisoprolol to 10 mg.  Recommended that she wear compression socks.  Continue checking blood pressures at home and bring to follow-up.  Thyroid function has been normal.

## 2022-11-22 NOTE — Telephone Encounter (Signed)
Received below secure chat from Dr Duke Salvia   For this patient can you please tell her to take dexamethasone 1mg  the night before she comes to get her labs? She needs to take it between 11p and midnight  Also need Echo for ASD   Left message to call back

## 2022-11-22 NOTE — Patient Instructions (Addendum)
Medication Instructions:  INCREASE YOUR BISOPROLOL 10 MG DAILY    Labwork: CORTISOL SOON    Testing/Procedures: NONE   Follow-Up: 12/21/2022 2:20 PM WITH CAITLIN W NP    SPECIAL INSTRUCTIONS:  WEAR COMPRESSION SOCK    DASH Eating Plan DASH stands for "Dietary Approaches to Stop Hypertension." The DASH eating plan is a healthy eating plan that has been shown to reduce high blood pressure (hypertension). It may also reduce your risk for type 2 diabetes, heart disease, and stroke. The DASH eating plan may also help with weight loss. What are tips for following this plan?  General guidelines Avoid eating more than 2,300 mg (milligrams) of salt (sodium) a day. If you have hypertension, you may need to reduce your sodium intake to 1,500 mg a day. Limit alcohol intake to no more than 1 drink a day for nonpregnant women and 2 drinks a day for men. One drink equals 12 oz of beer, 5 oz of wine, or 1 oz of hard liquor. Work with your health care provider to maintain a healthy body weight or to lose weight. Ask what an ideal weight is for you. Get at least 30 minutes of exercise that causes your heart to beat faster (aerobic exercise) most days of the week. Activities may include walking, swimming, or biking. Work with your health care provider or diet and nutrition specialist (dietitian) to adjust your eating plan to your individual calorie needs. Reading food labels  Check food labels for the amount of sodium per serving. Choose foods with less than 5 percent of the Daily Value of sodium. Generally, foods with less than 300 mg of sodium per serving fit into this eating plan. To find whole grains, look for the word "whole" as the first word in the ingredient list. Shopping Buy products labeled as "low-sodium" or "no salt added." Buy fresh foods. Avoid canned foods and premade or frozen meals. Cooking Avoid adding salt when cooking. Use salt-free seasonings or herbs instead of table salt or  sea salt. Check with your health care provider or pharmacist before using salt substitutes. Do not fry foods. Cook foods using healthy methods such as baking, boiling, grilling, and broiling instead. Cook with heart-healthy oils, such as olive, canola, soybean, or sunflower oil. Meal planning Eat a balanced diet that includes: 5 or more servings of fruits and vegetables each day. At each meal, try to fill half of your plate with fruits and vegetables. Up to 6-8 servings of whole grains each day. Less than 6 oz of lean meat, poultry, or fish each day. A 3-oz serving of meat is about the same size as a deck of cards. One egg equals 1 oz. 2 servings of low-fat dairy each day. A serving of nuts, seeds, or beans 5 times each week. Heart-healthy fats. Healthy fats called Omega-3 fatty acids are found in foods such as flaxseeds and coldwater fish, like sardines, salmon, and mackerel. Limit how much you eat of the following: Canned or prepackaged foods. Food that is high in trans fat, such as fried foods. Food that is high in saturated fat, such as fatty meat. Sweets, desserts, sugary drinks, and other foods with added sugar. Full-fat dairy products. Do not salt foods before eating. Try to eat at least 2 vegetarian meals each week. Eat more home-cooked food and less restaurant, buffet, and fast food. When eating at a restaurant, ask that your food be prepared with less salt or no salt, if possible. What foods are recommended? The  items listed may not be a complete list. Talk with your dietitian about what dietary choices are best for you. Grains Whole-grain or whole-wheat bread. Whole-grain or whole-wheat pasta. Brown rice. Orpah Cobb. Bulgur. Whole-grain and low-sodium cereals. Pita bread. Low-fat, low-sodium crackers. Whole-wheat flour tortillas. Vegetables Fresh or frozen vegetables (raw, steamed, roasted, or grilled). Low-sodium or reduced-sodium tomato and vegetable juice. Low-sodium or  reduced-sodium tomato sauce and tomato paste. Low-sodium or reduced-sodium canned vegetables. Fruits All fresh, dried, or frozen fruit. Canned fruit in natural juice (without added sugar). Meat and other protein foods Skinless chicken or [place]. Ground chicken or [place]. Pork with fat trimmed off. Fish and seafood. Egg whites. Dried beans, peas, or lentils. Unsalted nuts, nut butters, and seeds. Unsalted canned beans. Lean cuts of beef with fat trimmed off. Low-sodium, lean deli meat. Dairy Low-fat (1%) or fat-free (skim) milk. Fat-free, low-fat, or reduced-fat cheeses. Nonfat, low-sodium ricotta or cottage cheese. Low-fat or nonfat yogurt. Low-fat, low-sodium cheese. Fats and oils Soft margarine without trans fats. Vegetable oil. Low-fat, reduced-fat, or light mayonnaise and salad dressings (reduced-sodium). Canola, safflower, olive, soybean, and sunflower oils. Avocado. Seasoning and other foods Herbs. Spices. Seasoning mixes without salt. Unsalted popcorn and pretzels. Fat-free sweets. What foods are not recommended? The items listed may not be a complete list. Talk with your dietitian about what dietary choices are best for you. Grains Baked goods made with fat, such as croissants, muffins, or some breads. Dry pasta or rice meal packs. Vegetables Creamed or fried vegetables. Vegetables in a cheese sauce. Regular canned vegetables (not low-sodium or reduced-sodium). Regular canned tomato sauce and paste (not low-sodium or reduced-sodium). Regular tomato and vegetable juice (not low-sodium or reduced-sodium). Rosita Fire. Olives. Fruits Canned fruit in a light or heavy syrup. Fried fruit. Fruit in cream or butter sauce. Meat and other protein foods Fatty cuts of meat. Ribs. Fried meat. Tomasa Blase. Sausage. Bologna and other processed lunch meats. Salami. Fatback. Hotdogs. Bratwurst. Salted nuts and seeds. Canned beans with added salt. Canned or smoked fish. Whole eggs or egg yolks. Chicken or [place]  with skin. Dairy Whole or 2% milk, cream, and half-and-half. Whole or full-fat cream cheese. Whole-fat or sweetened yogurt. Full-fat cheese. Nondairy creamers. Whipped toppings. Processed cheese and cheese spreads. Fats and oils Butter. Stick margarine. Lard. Shortening. Ghee. Bacon fat. Tropical oils, such as coconut, palm kernel, or palm oil. Seasoning and other foods Salted popcorn and pretzels. Onion salt, garlic salt, seasoned salt, table salt, and sea salt. Worcestershire sauce. Tartar sauce. Barbecue sauce. Teriyaki sauce. Soy sauce, including reduced-sodium. Steak sauce. Canned and packaged gravies. Fish sauce. Oyster sauce. Cocktail sauce. Horseradish that you find on the shelf. Ketchup. Mustard. Meat flavorings and tenderizers. Bouillon cubes. Hot sauce and Tabasco sauce. Premade or packaged marinades. Premade or packaged taco seasonings. Relishes. Regular salad dressings. Where to find more information: National Heart, Lung, and Blood Institute: PopSteam.is American Heart Association: www.heart.org Summary The DASH eating plan is a healthy eating plan that has been shown to reduce high blood pressure (hypertension). It may also reduce your risk for type 2 diabetes, heart disease, and stroke. With the DASH eating plan, you should limit salt (sodium) intake to 2,300 mg a day. If you have hypertension, you may need to reduce your sodium intake to 1,500 mg a day. When on the DASH eating plan, aim to eat more fresh fruits and vegetables, whole grains, lean proteins, low-fat dairy, and heart-healthy fats. Work with your health care provider or diet and nutrition specialist (dietitian)  to adjust your eating plan to your individual calorie needs. This information is not intended to replace advice given to you by your health care provider. Make sure you discuss any questions you have with your health care provider. Document Released: 11/09/2011 Document Revised: 11/02/2017 Document Reviewed:  11/13/2016 Elsevier Patient Education  2020 [employer].

## 2022-11-23 ENCOUNTER — Telehealth (HOSPITAL_BASED_OUTPATIENT_CLINIC_OR_DEPARTMENT_OTHER): Payer: Self-pay | Admitting: Cardiovascular Disease

## 2022-11-23 LAB — METANEPHRINES, URINE, 24 HOUR
Metaneph Total, Ur: 117 ug/L
Metanephrines, 24H Ur: 117 ug/24 hr (ref 36–209)
Normetanephrine, 24H Ur: 451 ug/24 hr — ABNORMAL HIGH (ref 90–315)
Normetanephrine, Ur: 451 ug/L

## 2022-11-23 NOTE — Telephone Encounter (Signed)
Advised patient and sent Rx to pharmacy. 

## 2022-11-23 NOTE — Telephone Encounter (Signed)
Left message for patient to call and discuss scheduling the Echocardiogram ordered by Dr. [place] 

## 2022-11-27 ENCOUNTER — Encounter: Payer: Self-pay | Admitting: Internal Medicine

## 2022-11-30 ENCOUNTER — Other Ambulatory Visit: Payer: Self-pay | Admitting: Family Medicine

## 2022-11-30 DIAGNOSIS — M5410 Radiculopathy, site unspecified: Secondary | ICD-10-CM

## 2022-12-01 ENCOUNTER — Encounter: Payer: Self-pay | Admitting: Family Medicine

## 2022-12-01 ENCOUNTER — Other Ambulatory Visit: Payer: Medicaid Other

## 2022-12-02 ENCOUNTER — Encounter (HOSPITAL_BASED_OUTPATIENT_CLINIC_OR_DEPARTMENT_OTHER): Payer: Self-pay | Admitting: Cardiovascular Disease

## 2022-12-02 LAB — CORTISOL: Cortisol: 0.4 ug/dL — ABNORMAL LOW (ref 6.2–19.4)

## 2022-12-05 ENCOUNTER — Telehealth: Payer: Self-pay

## 2022-12-05 ENCOUNTER — Other Ambulatory Visit (HOSPITAL_COMMUNITY): Payer: Self-pay

## 2022-12-05 NOTE — Telephone Encounter (Signed)
PA request received via CMM for Fexofenadine 180mg  tablets through [employer].  PA submitted via [employer] and APPROVED from 12/05/2022 - 12/05/2023.

## 2022-12-06 NOTE — Telephone Encounter (Signed)
I had sent this result forward to Dr. Oval Linsey as he had seen her, her note had said she was awaiting this result, I have reached out to her again to make sure that the ball does not drop.  I will include her on this message to soon as she can be harassed twice but will leave that she got it.

## 2022-12-12 ENCOUNTER — Ambulatory Visit (INDEPENDENT_AMBULATORY_CARE_PROVIDER_SITE_OTHER): Payer: Medicaid Other | Admitting: Family Medicine

## 2022-12-12 ENCOUNTER — Encounter: Payer: Self-pay | Admitting: Family Medicine

## 2022-12-12 ENCOUNTER — Telehealth (HOSPITAL_BASED_OUTPATIENT_CLINIC_OR_DEPARTMENT_OTHER): Payer: Self-pay | Admitting: *Deleted

## 2022-12-12 DIAGNOSIS — B372 Candidiasis of skin and nail: Secondary | ICD-10-CM | POA: Diagnosis not present

## 2022-12-12 DIAGNOSIS — I10 Essential (primary) hypertension: Secondary | ICD-10-CM

## 2022-12-12 DIAGNOSIS — Z01812 Encounter for preprocedural laboratory examination: Secondary | ICD-10-CM

## 2022-12-12 DIAGNOSIS — L0291 Cutaneous abscess, unspecified: Secondary | ICD-10-CM | POA: Diagnosis not present

## 2022-12-12 DIAGNOSIS — R825 Elevated urine levels of drugs, medicaments and biological substances: Secondary | ICD-10-CM

## 2022-12-12 MED ORDER — NYSTATIN 100000 UNIT/GM EX POWD: 1.0000 | Freq: Three times a day (TID) | CUTANEOUS | 0 refills | Status: DC

## 2022-12-12 MED ORDER — DOXYCYCLINE HYCLATE 100 MG PO TABS: 100.0000 mg | ORAL_TABLET | Freq: Two times a day (BID) | ORAL | 0 refills | Status: AC

## 2022-12-12 NOTE — Telephone Encounter (Signed)
Left message to call back  

## 2022-12-12 NOTE — Telephone Encounter (Signed)
Labs reviewed by Dr Oval Linsey, comments below  Catecholamines are mildly abnormal.  Recommend getting a CT adrenal protocol to assess for pheochromocytoma.  TCR   Left message to call back

## 2022-12-12 NOTE — Progress Notes (Signed)
Virtual Visit via telephone Note Due to COVID-19 pandemic this visit was conducted virtually. This visit type was conducted due to national recommendations for restrictions regarding the COVID-19 Pandemic (e.g. social distancing, sheltering in place) in an effort to limit this patient's exposure and mitigate transmission in our community. All issues noted in this document were discussed and addressed.  A physical exam was not performed with this format.   I connected with Molly [name] on 12/12/2022 at 1410 by telephone and verified that I am speaking with the correct person using two identifiers. Molly [name] is currently located at home and mother is currently with them during visit. The provider, Monia Pouch, FNP is located in their office at time of visit.  I discussed the limitations, risks, security and privacy concerns of performing an evaluation and management service by virtual visit and the availability of in person appointments. I also discussed with the patient that there may be a patient responsible charge related to this service. The patient expressed understanding and agreed to proceed.  Subjective:  Patient ID: Molly [name], female    DOB: 04-30-2001, 22 y.o.   MRN: [id number]  Chief Complaint:  Abscess   HPI: Molly [name] is a 22 y.o. female presenting on 12/12/2022 for Abscess   Pt presents today with complaints of an abscess to her breast. States it is red, hot to touch, tender, and drains at times. States she also has a pruritic rash under both breasts.   Abscess This is a new problem. The current episode started in the past 7 days. The problem has been gradually worsening. Associated symptoms include a rash (red rash under both breasts). Pertinent negatives include no abdominal pain, anorexia, arthralgias, change in bowel habit, chest pain, chills, congestion, coughing, diaphoresis, fatigue, fever, headaches, joint swelling, myalgias, nausea,  neck pain, numbness, sore throat, swollen glands, urinary symptoms, vertigo, visual change, vomiting or weakness. Nothing aggravates the symptoms. Treatments tried: topical creams. The treatment provided no relief.     Relevant past medical, surgical, family, and social history reviewed and updated as indicated.  Allergies and medications reviewed and updated.   Past Medical History:  Diagnosis Date   Acid reflux 2010   Acne vulgaris 2017   ADHD (attention deficit hyperactivity disorder) 2011   Adverse food reaction 07/11/2016   Shellfish allergy   Amenorrhea 2019   Anaphylactic shock due to adverse food reaction 01/15/2018   Anxiety 2017   ASD (atrial septal defect) 11/22/2022   Chronic constipation 09/05/2011   Chronic tension headaches 2011   originally followed by [facility] K.Griffin. Then Dr Consuello [place] [facility] 03/2020 (see above)    Coalition, calcaneus navicular    Dysmenorrhea    Eczema    Eustachian tube dysfunction, left 10/23/2019   Frequent headaches    Galactorrhea    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 04/01/2019   Hiatal hernia    History of atrial septal defect repair 12/04/2011   Hyperlipidemia 2013   Hypertension 2011   Hypertension 09/28/2020   hypertension, cellulitus    Idiopathic urticaria 09/05/2011   Insomnia    Keratosis pilaris 01/15/2018   Mass of left thigh 11/22/2017   Overview:  Added automatically from request for surgery [id number]   MDD (major depressive disorder), severe ([place]) 03/06/2018   Migraine 2011   re-diagnosed as chronic migraines 03/2020 [facility] - starting on preventative    Obesity without serious comorbidity with body mass index (BMI) in 95th to 98th percentile for  age in pediatric patient 09/04/2018   Piriformis syndrome    POTS (postural orthostatic tachycardia syndrome)    PTSD (post-traumatic stress disorder)    Scoliosis 2019   Seasonal and perennial allergic rhinitis 2010   Severe persistent asthma without complication 2010    Sleep paralysis    Suicidal ideation 03/07/2018   Tic disorder 2019    Past Surgical History:  Procedure Laterality Date   ASD REPAIR  2012   with Helix, via Cardiac Catheterization   atrial septal defecgt     CARDIAC SURGERY     DENTAL SURGERY  2020   lipoma removal  2019   LUMBAR PUNCTURE  05/20/2021   TONSILLECTOMY AND ADENOIDECTOMY  2010    Social History   Socioeconomic History   Marital status: Single    Spouse name: Not on file   Number of children: Not on file   Years of education: Not on file   Highest education level: Not on file  Occupational History   Not on file  Tobacco Use   Smoking status: Never   Smokeless tobacco: Never  Vaping Use   Vaping Use: Never used  Substance and Sexual Activity   Alcohol use: No    Alcohol/week: 0.0 standard drinks of alcohol   Drug use: No   Sexual activity: Yes  Other Topics Concern   Not on file  Social History Narrative   Not on file   Social Determinants of Health   Financial Resource Strain: Low Risk  (11/22/2022)   Overall Financial Resource Strain (CARDIA)    Difficulty of Paying Living Expenses: Not very hard  Food Insecurity: No Food Insecurity (11/22/2022)   Hunger Vital Sign    Worried About Running Out of Food in the Last Year: Never true    Ran Out of Food in the Last Year: Never true  Transportation Needs: No Transportation Needs (11/22/2022)   PRAPARE - Administrator, Civil Service (Medical): No    Lack of Transportation (Non-Medical): No  Physical Activity: Not on file  Stress: Not on file  Social Connections: Not on file  Intimate Partner Violence: Not on file    Outpatient Encounter Medications as of 12/12/2022  Medication Sig   doxycycline (VIBRA-TABS) 100 MG tablet Take 1 tablet (100 mg total) by mouth 2 (two) times daily for 10 days. 1 po bid   nystatin (MYCOSTATIN/NYSTOP) powder Apply 1 Application topically 3 (three) times daily.   albuterol (PROVENTIL) (2.5 MG/3ML) 0.083%  nebulizer solution Take 3 mLs (2.5 mg total) by nebulization every 6 (six) hours as needed for wheezing or shortness of breath.   ascorbic acid (VITAMIN C) 500 MG tablet Take 500 mg by mouth daily.   atomoxetine (STRATTERA) 60 MG capsule Take 1 capsule (60 mg total) by mouth daily.   Azelastine-Fluticasone 137-50 MCG/ACT SUSP Place 1 spray into both nostrils 2 (two) times daily.   bisoprolol (ZEBETA) 10 MG tablet Take 1 tablet (10 mg total) by mouth daily.   budesonide-formoterol (SYMBICORT) 160-4.5 MCG/ACT inhaler Inhale 2 puffs into the lungs in the morning and at bedtime.   busPIRone (BUSPAR) 10 MG tablet Take 1 tablet (10 mg total) by mouth 2 (two) times daily.   calcium carbonate (OS-CAL) 1250 (500 Ca) MG chewable tablet Chew by mouth.   cariprazine (VRAYLAR) 1.5 MG capsule Take 1 capsule (1.5 mg total) by mouth daily.   Cholecalciferol 25 MCG (1000 UT) tablet Take 1 Units by mouth daily at 2 PM.  clobetasol (TEMOVATE) 0.05 % external solution APPLY TO AFFECTED AREA TWICE A DAY   clonazePAM (KLONOPIN) 0.5 MG tablet Take 1 tablet (0.5 mg total) by mouth 2 (two) times daily as needed for anxiety.   EPINEPHrine 0.3 mg/0.3 mL IJ SOAJ injection Inject 0.3 mg into the muscle as needed for anaphylaxis.   fexofenadine (ALLEGRA) 180 MG tablet Take 1 tablet (180 mg total) by mouth daily.   Fluocinolone Acetonide 0.01 % OIL Place in ear(s).   Galcanezumab-gnlm 120 MG/ML SOAJ Inject into the skin.   hydrOXYzine (VISTARIL) 25 MG capsule Take 1-2 capsules (25-50 mg total) by mouth daily as needed.   hyoscyamine (LEVSIN SL) 0.125 MG SL tablet Take by mouth.   LINZESS 72 MCG capsule Take 72 mcg by mouth daily.   methocarbamol (ROBAXIN) 500 MG tablet Take 1 tablet (500 mg total) by mouth every 8 (eight) hours as needed for muscle spasms.   metoprolol tartrate (LOPRESSOR) 25 MG tablet Take 1 tablet (25 mg total) by mouth as needed.   montelukast (SINGULAIR) 10 MG tablet Take 1 tablet (10 mg total) by mouth  daily.   Multiple Vitamin (MULTIVITAMIN) capsule Take by mouth.   norethindrone (AYGESTIN) 5 MG tablet Take 5 mg by mouth daily.   nystatin cream (MYCOSTATIN) APPLY TO AFFECTED AREA TWICE A DAY   Olopatadine HCl 0.2 % SOLN Apply 1 drop to eye every morning.   ondansetron (ZOFRAN-ODT) 4 MG disintegrating tablet DISSOLVE ON TONGUE AT ONSET OF NAUSEA- 30 DAY SUPPLY   pantoprazole (PROTONIX) 40 MG tablet Take 1 tablet by mouth in the morning and at bedtime.   RESTASIS 0.05 % ophthalmic emulsion 1 drop 2 (two) times daily.   sertraline (ZOLOFT) 50 MG tablet Take 1.5 tablets (75 mg total) by mouth daily.   SUMAtriptan (IMITREX) 100 MG tablet Take by mouth.   VENTOLIN HFA 108 (90 Base) MCG/ACT inhaler INHALE 2 PUFFS INTO THE LUNGS EVERY 4 HOURS AS NEEDED FOR WHEEZING OR SHORTNESS OF BREATH.   No facility-administered encounter medications on file as of 12/12/2022.    Allergies  Allergen Reactions   Glucosamine Forte [Nutritional Supplements] Anaphylaxis    Due to containing shellfish   Nutritional Supplements Anaphylaxis   Oyster Extract Anaphylaxis   Shellfish-Derived Products Anaphylaxis    Throat swelling   Cats Claw (Uncaria Tomentosa) Nausea And Vomiting   Diamox [Acetazolamide] Other (See Comments)    Numbness- feet, hands, lips   Lactose    Lactose Intolerance (Gi)    Topiramate Other (See Comments)    Numbness to face, hands and feet   Bromfed Dm [Pseudoeph-Bromphen-Dm] Anxiety    CARBOFED DM    Review of Systems  Constitutional:  Negative for activity change, appetite change, chills, diaphoresis, fatigue, fever and unexpected weight change.  HENT:  Negative for congestion and sore throat.   Respiratory:  Negative for cough.   Cardiovascular:  Negative for chest pain.  Gastrointestinal:  Negative for abdominal pain, anorexia, change in bowel habit, nausea and vomiting.  Genitourinary:  Negative for decreased urine volume and difficulty urinating.  Musculoskeletal:  Negative  for arthralgias, joint swelling, myalgias and neck pain.  Skin:  Positive for color change, rash (red rash under both breasts) and wound. Negative for pallor.  Neurological:  Negative for vertigo, weakness, numbness and headaches.  Psychiatric/Behavioral:  Negative for confusion.   All other systems reviewed and are negative.        Observations/Objective: No vital signs or physical exam, this was a virtual health  encounter.  Pt alert and oriented, answers all questions appropriately, and able to speak in full sentences.    Assessment and Plan: Molly [name] was seen today for abscess.  Diagnoses and all orders for this visit:  Abscess Symptomatic care discussed in detail, medications as prescribed.  -     doxycycline (VIBRA-TABS) 100 MG tablet; Take 1 tablet (100 mg total) by mouth 2 (two) times daily for 10 days. 1 po bid  Yeast dermatitis Symptomatic care discussed in detail. Medications as prescribed.  -     nystatin (MYCOSTATIN/NYSTOP) powder; Apply 1 Application topically 3 (three) times daily.     Follow Up Instructions: Return in about 1 month (around 01/12/2023), or if symptoms worsen or fail to improve.    I discussed the assessment and treatment plan with the patient. The patient was provided an opportunity to ask questions and all were answered. The patient agreed with the plan and demonstrated an understanding of the instructions.   The patient was advised to call back or seek an in-person evaluation if the symptoms worsen or if the condition fails to improve as anticipated.  The above assessment and management plan was discussed with the patient. The patient verbalized understanding of and has agreed to the management plan. Patient is aware to call the clinic if they develop any new symptoms or if symptoms persist or worsen. Patient is aware when to return to the clinic for a follow-up visit. Patient educated on when it is appropriate to go to the emergency department.     I provided 15 minutes of time during this telephone encounter.   Kari Baars, FNP-C [facility] [address] [place], [place] [address] [phone] 12/12/2022

## 2022-12-13 ENCOUNTER — Ambulatory Visit: Payer: Medicaid Other | Admitting: Allergy & Immunology

## 2022-12-13 ENCOUNTER — Encounter (HOSPITAL_BASED_OUTPATIENT_CLINIC_OR_DEPARTMENT_OTHER): Payer: Self-pay | Admitting: Cardiovascular Disease

## 2022-12-13 NOTE — Telephone Encounter (Signed)
Mother returned RN's call.

## 2022-12-13 NOTE — Telephone Encounter (Signed)
Advised mother, ok per DPR   Order placed and message to Zigmund Daniel to get scheduled

## 2022-12-13 NOTE — Telephone Encounter (Signed)
This encounter was created in error - please disregard.

## 2022-12-13 NOTE — Telephone Encounter (Signed)
Molly [name]   12/13/22  9:03 AM Note Mother returned RN's call.

## 2022-12-14 ENCOUNTER — Telehealth: Payer: Self-pay | Admitting: Cardiology

## 2022-12-14 NOTE — Telephone Encounter (Signed)
The Molly [name] and her mother called 12/14/22 Molly [name] did not feel well and slept late, when she woke her BP 132/101 and pulse 99 she took her meds.  Now after 2 hours she felt her heart was beating out of her chest and some dizziness.  BP 127/92 and p 92.  Some dizziness   No cough, cold or fever but she does have an abscess under her arm and is now on doxycycline.     I discussed with Dr. Beckie Busing and have encouraged her to take fluid, some bullion as well.   Dr. Oval Linsey has asked her to have CT after her labs came back abnormal but not yet arranged.     Molly [name] and her mother are agreeable with fluids and if symptoms increase they will come to ER.

## 2022-12-15 ENCOUNTER — Telehealth (HOSPITAL_BASED_OUTPATIENT_CLINIC_OR_DEPARTMENT_OTHER): Payer: Self-pay | Admitting: Cardiovascular Disease

## 2022-12-15 NOTE — Telephone Encounter (Signed)
Left message for patient to call and discuss scheduling the CT abdomen ordered by Dr. Oval Linsey

## 2022-12-15 NOTE — Telephone Encounter (Signed)
Patient aware of upcoming CT abdomen w/wo contrast appt on Wednesday 12/20/22 at 6:00 pm at [facility]. (Ground floor imaging)  Arrival time is 5:45 pm for check in.

## 2022-12-15 NOTE — Telephone Encounter (Signed)
  [name] mom returning call, she said she will be at home in the next 30 minutes and will leave to do some errands, she said to call [phone]

## 2022-12-15 NOTE — Progress Notes (Signed)
Message left requesting patient to call back for lab result. TDS

## 2022-12-18 ENCOUNTER — Telehealth: Payer: Self-pay | Admitting: Cardiovascular Disease

## 2022-12-18 ENCOUNTER — Encounter (HOSPITAL_BASED_OUTPATIENT_CLINIC_OR_DEPARTMENT_OTHER): Payer: Self-pay | Admitting: Cardiovascular Disease

## 2022-12-18 NOTE — Telephone Encounter (Signed)
Patient's mother states on Friday she was advised to call and ask to speak with a triage nurse.

## 2022-12-18 NOTE — Telephone Encounter (Signed)
Ensure staying well hydrated, wearing compression stockings, avoiding caffeine, manage stress well. This helps to prevent palpitations.   Bisoprolol icnreased at most recent office visit to help with heart rate. Recommend follow up as scheduled with Dr. Caryl Comes. May benefit from additional heart rate control but will defer to Dr. Caryl Comes as Dr. Oval Linsey is predominanlty following for hypertension.   Anticipate wrist cuff likely giving falsely high readings and reading earlier today of 130 on arm cuff is great.     Loel Dubonnet, NP

## 2022-12-18 NOTE — Telephone Encounter (Signed)
Returned call to patient and provided the following recommendations. Patient verbalizes understanding and states she had her brother help her put the arm cuff on and pressure is now  114/83 105. Reassured patient that this is good that blood pressure is coming down!     "Ensure staying well hydrated, wearing compression stockings, avoiding caffeine, manage stress well. This helps to prevent palpitations.    Bisoprolol icnreased at most recent office visit to help with heart rate. Recommend follow up as scheduled with Dr. Caryl Comes. May benefit from additional heart rate control but will defer to Dr. Caryl Comes as Dr. Oval Linsey is predominanlty following for hypertension.    Anticipate wrist cuff likely giving falsely high readings and reading earlier today of 130 on arm cuff is great.      Loel Dubonnet, NP"

## 2022-12-18 NOTE — Telephone Encounter (Signed)
Returned call,   Patient states she is feeling okay now. BP about an hour ago was 130 something/108 (arm cuff). Patient is using a wrist cuff- states she needs her mom help to use the arm cuff and she isn't there, using the wrist cuff while on the phone BP is 167/100 110. Denies chest pain or shortness of breath. She does she endorse it feels like her heart is racing. She states even when she rests sometimes it continues to race.   Routing to Laurann Montana, NP for eval as she is provider in office at this time!

## 2022-12-18 NOTE — Telephone Encounter (Signed)
Talked to [facility] Friday and CT was scheduled.  Spoke with on call provider Thursday for tachycardia. Can call her to reassess for any recurrent symptoms. She was recommended at that time to increase hydration.   Loel Dubonnet, NP

## 2022-12-19 ENCOUNTER — Encounter (HOSPITAL_BASED_OUTPATIENT_CLINIC_OR_DEPARTMENT_OTHER): Payer: Self-pay | Admitting: Cardiovascular Disease

## 2022-12-19 ENCOUNTER — Encounter: Payer: Self-pay | Admitting: Family Medicine

## 2022-12-19 NOTE — Telephone Encounter (Signed)
Returned call to patient and mom, answered questions, both verbalize understanding.

## 2022-12-19 NOTE — Telephone Encounter (Signed)
Mother calling back for update. Please advise

## 2022-12-20 ENCOUNTER — Other Ambulatory Visit (HOSPITAL_BASED_OUTPATIENT_CLINIC_OR_DEPARTMENT_OTHER): Payer: Self-pay | Admitting: Cardiovascular Disease

## 2022-12-20 ENCOUNTER — Ambulatory Visit (HOSPITAL_BASED_OUTPATIENT_CLINIC_OR_DEPARTMENT_OTHER)
Admission: RE | Admit: 2022-12-20 | Discharge: 2022-12-20 | Disposition: A | Discharge status: Home / Self Care | Payer: Medicaid Other | Source: Ambulatory Visit | Attending: Cardiovascular Disease | Admitting: Cardiovascular Disease

## 2022-12-20 DIAGNOSIS — I10 Essential (primary) hypertension: Secondary | ICD-10-CM | POA: Diagnosis present

## 2022-12-20 DIAGNOSIS — R825 Elevated urine levels of drugs, medicaments and biological substances: Secondary | ICD-10-CM | POA: Diagnosis present

## 2022-12-21 ENCOUNTER — Telehealth: Payer: Medicaid Other | Admitting: Internal Medicine

## 2022-12-21 ENCOUNTER — Ambulatory Visit (HOSPITAL_BASED_OUTPATIENT_CLINIC_OR_DEPARTMENT_OTHER): Payer: Medicaid Other | Admitting: Family

## 2022-12-22 ENCOUNTER — Encounter (HOSPITAL_BASED_OUTPATIENT_CLINIC_OR_DEPARTMENT_OTHER): Payer: Self-pay | Admitting: Cardiovascular Disease

## 2022-12-28 ENCOUNTER — Ambulatory Visit: Payer: Medicaid Other | Attending: Internal Medicine | Admitting: Internal Medicine

## 2022-12-28 ENCOUNTER — Encounter: Payer: Self-pay | Admitting: Internal Medicine

## 2022-12-28 VITALS — BP 143/81 | HR 92 | Ht 59.0 in

## 2022-12-28 DIAGNOSIS — I951 Orthostatic hypotension: Secondary | ICD-10-CM | POA: Diagnosis not present

## 2022-12-28 DIAGNOSIS — I4719 Other supraventricular tachycardia: Secondary | ICD-10-CM

## 2022-12-28 NOTE — Patient Instructions (Addendum)
Medication Instructions:  Your physician recommends that you continue on your current medications as directed. Please refer to the Current Medication list given to you today.   Dr Caryl Comes is researching side effects of Robinul  *If you need a refill on your cardiac medications before your next appointment, please call your pharmacy*   Lab Work: None ordered.  If you have labs (blood work) drawn today and your tests are completely normal, you will receive your results only by: [place] (if you have MyChart) OR A paper copy in the mail If you have any lab test that is abnormal or we need to change your treatment, we will call you to review the results.   Testing/Procedures: None ordered.    Follow-Up: At [facility], you and your health needs are our priority.  As part of our continuing mission to provide you with exceptional heart care, we have created designated Provider Care Teams.  These Care Teams include your primary Cardiologist (physician) and Advanced Practice Providers (APPs -  Physician Assistants and Nurse Practitioners) who all work together to provide you with the care you need, when you need it.  We recommend signing up for the patient portal called "MyChart".  Sign up information is provided on this After Visit Summary.  MyChart is used to connect with patients for Virtual Visits (Telemedicine).  Patients are able to view lab/test results, encounter notes, upcoming appointments, etc.  Non-urgent messages can be sent to your provider as well.   To learn more about what you can do with MyChart, go to [email or web address].    Your next appointment:   4 months with Dr Caryl Comes - I will have his scheduler call you to set up this appointment.

## 2022-12-28 NOTE — Progress Notes (Signed)
Electrophysiology TeleHealth Note   Due to national recommendations of social distancing due to COVID 19, an audio/video telehealth visit is felt to be most appropriate for this patient at this time.  See MyChart message from today for the patient's consent to telehealth for [facility].   Date:  12/28/2022   ID:  Molly [name], DOB [birth year], MRN [id number]  Location: patient's home  Provider location: [address], [place]  Evaluation Performed: Initial Evaluation  PCP:  Baruch Gouty, FNP  Cardiologist:  None   Electrophysiologist:  None   Chief Complaint:  spells  History of Present Illness:    Molly [name] is a 22 y.o. female who presents via audio/video conferencing for a telehealth visit today for spells characterized by tremors, diaphoresis, lightheadedness, flushing. Continues to have these a couple of times a week.  Mother reiterates that she is flushed with ease and not pale.  She does have some pruritus.  No diarrhea.       Evaluation for pheochromocytoma was notable for mildly abnormal urine catecholamines; CT of the abdomen was normal  --  the urine studies are apparently 98 5 sensitivity and specificity --but I dont know about CT scanning   f   Past Medical History:  Diagnosis Date   Acid reflux 2010   Acne vulgaris 2017   ADHD (attention deficit hyperactivity disorder) 2011   Adverse food reaction 07/11/2016   Shellfish allergy   Amenorrhea 2019   Anaphylactic shock due to adverse food reaction 01/15/2018   Anxiety 2017   ASD (atrial septal defect) 11/22/2022   Chronic constipation 09/05/2011   Chronic tension headaches 2011   originally followed by [facility] K.Griffin. Then Dr Consuello Bossier [facility] 03/2020 (see above)    Coalition, calcaneus navicular    Dysmenorrhea    Eczema    Eustachian tube dysfunction, left 10/23/2019   Frequent headaches    Galactorrhea    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 04/01/2019   Hiatal  hernia    History of atrial septal defect repair 12/04/2011   Hyperlipidemia 2013   Hypertension 2011   Hypertension 09/28/2020   hypertension, cellulitus    Idiopathic urticaria 09/05/2011   Insomnia    Keratosis pilaris 01/15/2018   Mass of left thigh 11/22/2017   Overview:  Added automatically from request for surgery [id number]   MDD (major depressive disorder), severe ([place]) 03/06/2018   Migraine 2011   re-diagnosed as chronic migraines 03/2020 [facility] - starting on preventative    Obesity without serious comorbidity with body mass index (BMI) in 95th to 98th percentile for age in pediatric patient 09/04/2018   Piriformis syndrome    POTS (postural orthostatic tachycardia syndrome)    PTSD (post-traumatic stress disorder)    Scoliosis 2019   Seasonal and perennial allergic rhinitis 2010   Severe persistent asthma without complication 7902   Sleep paralysis    Suicidal ideation 03/07/2018   Tic disorder 2019    Past Surgical History:  Procedure Laterality Date   ASD REPAIR  2012   with Helix, via Cardiac Catheterization   atrial septal defecgt     CARDIAC SURGERY     DENTAL SURGERY  2020   lipoma removal  2019   LUMBAR PUNCTURE  05/20/2021   TONSILLECTOMY AND ADENOIDECTOMY  2010    Current Outpatient Medications  Medication Sig Dispense Refill   albuterol (PROVENTIL) (2.5 MG/3ML) 0.083% nebulizer solution Take 3 mLs (2.5 mg total) by nebulization every  6 (six) hours as needed for wheezing or shortness of breath. 75 mL 1   ascorbic acid (VITAMIN C) 500 MG tablet Take 500 mg by mouth daily.     atomoxetine (STRATTERA) 60 MG capsule Take 1 capsule (60 mg total) by mouth daily. 30 capsule 2   Azelastine-Fluticasone 137-50 MCG/ACT SUSP Place 1 spray into both nostrils 2 (two) times daily. 23 g 5   bisoprolol (ZEBETA) 10 MG tablet Take 1 tablet (10 mg total) by mouth daily. 90 tablet 1   budesonide-formoterol (SYMBICORT) 160-4.5 MCG/ACT inhaler Inhale 2 puffs into the lungs in  the morning and at bedtime. (Patient taking differently: Inhale 1 puff into the lungs every morning.) 1 each 5   busPIRone (BUSPAR) 10 MG tablet Take 1 tablet (10 mg total) by mouth 2 (two) times daily. 60 tablet 2   calcium carbonate (OS-CAL) 1250 (500 Ca) MG chewable tablet Chew by mouth.     cariprazine (VRAYLAR) 1.5 MG capsule Take 1 capsule (1.5 mg total) by mouth daily. 30 capsule 2   Cholecalciferol 25 MCG (1000 UT) tablet Take 1 Units by mouth daily at 2 PM.     clobetasol (TEMOVATE) 0.05 % external solution APPLY TO AFFECTED AREA TWICE A DAY 50 mL 3   clonazePAM (KLONOPIN) 0.5 MG tablet Take 1 tablet (0.5 mg total) by mouth 2 (two) times daily as needed for anxiety. 60 tablet 2   EPINEPHrine 0.3 mg/0.3 mL IJ SOAJ injection Inject 0.3 mg into the muscle as needed for anaphylaxis. 2 each 2   fexofenadine (ALLEGRA) 180 MG tablet Take 1 tablet (180 mg total) by mouth daily. 30 tablet 5   Galcanezumab-gnlm 120 MG/ML SOAJ Inject into the skin.     hydrOXYzine (VISTARIL) 25 MG capsule Take 1-2 capsules (25-50 mg total) by mouth daily as needed. 60 capsule 2   hyoscyamine (LEVSIN SL) 0.125 MG SL tablet Take by mouth as needed.     LINZESS 72 MCG capsule Take 72 mcg by mouth daily.     methocarbamol (ROBAXIN) 500 MG tablet Take 1 tablet (500 mg total) by mouth every 8 (eight) hours as needed for muscle spasms. 30 tablet 0   metoprolol tartrate (LOPRESSOR) 25 MG tablet Take 1 tablet (25 mg total) by mouth as needed. 15 tablet 1   montelukast (SINGULAIR) 10 MG tablet Take 1 tablet (10 mg total) by mouth daily. 30 tablet 5   Multiple Vitamin (MULTIVITAMIN) capsule Take by mouth.     norethindrone (AYGESTIN) 5 MG tablet Take 5 mg by mouth daily.     nystatin (MYCOSTATIN/NYSTOP) powder Apply 1 Application topically 3 (three) times daily. (Patient taking differently: Apply 1 Application topically as needed.) 15 g 0   nystatin cream (MYCOSTATIN) APPLY TO AFFECTED AREA TWICE A DAY (Patient taking  differently: as needed.) 60 g 1   Olopatadine HCl 0.2 % SOLN Apply 1 drop to eye every morning.     ondansetron (ZOFRAN-ODT) 4 MG disintegrating tablet DISSOLVE ON TONGUE AT ONSET OF NAUSEA- 30 DAY SUPPLY 10 tablet 1   pantoprazole (PROTONIX) 40 MG tablet Take 1 tablet by mouth in the morning and at bedtime.     RESTASIS 0.05 % ophthalmic emulsion 1 drop 2 (two) times daily.     sertraline (ZOLOFT) 50 MG tablet Take 1.5 tablets (75 mg total) by mouth daily. 45 tablet 2   SUMAtriptan (IMITREX) 100 MG tablet Take by mouth.     VENTOLIN HFA 108 (90 Base) MCG/ACT inhaler INHALE 2 PUFFS  INTO THE LUNGS EVERY 4 HOURS AS NEEDED FOR WHEEZING OR SHORTNESS OF BREATH. 18 each 1   Fluocinolone Acetonide 0.01 % OIL Place in ear(s).     No current facility-administered medications for this visit.    Allergies:   Glucosamine forte [nutritional supplements], Nutritional supplements, Oyster extract, Shellfish-derived products, Cats claw (uncaria tomentosa), Diamox [acetazolamide], Lactose, Lactose intolerance (gi), Topiramate, and Bromfed dm [pseudoeph-bromphen-dm]      ROS:  Please see the history of present illness.   All other systems are personally reviewed and negative.    Exam:    Vital Signs:  BP (!) 143/81 Comment: pt provided  Pulse 92 Comment: pt provided  Ht 4\' 11"  (1.499 m)   LMP  (LMP Unknown) Comment: last cycle in 2020  BMI 50.25 kg/m         Labs/Other Tests and Data Reviewed:    Recent Labs: 03/06/2022: TSH 1.550 10/11/2022: ALT 34; BUN 5; Creatinine, Ser 0.57; Hemoglobin 13.6; Platelets 286; Potassium 4.0; Sodium 142   Wt Readings from Last 3 Encounters:  11/22/22 248 lb 12.8 oz (112.9 kg) (>99 %, Z= 2.48)*  11/15/22 240 lb (108.9 kg) (>99 %, Z= 2.41)*  11/07/22 248 lb (112.5 kg) (>99 %, Z= 2.47)*   * Growth percentiles are based on CDC (Girls, 2-20 Years) data.     Other studies personally reviewed: Additional studies/ records that were reviewed today include:  (As above)        ASSESSMENT & PLAN:    Spell with flushing diaphoresis   Hypertension   Dr. 14/05/23 evaluation is ongoing.    Spells  has some rash and pruritus with her spells, the CT scan was unrevealing for adrenal masses, I wonder whether she could have mastocytosis.  According to up-to-date diagnostic evaluation includes 1-tryptase level-serum at rest 2-CBC with differential 3-KIT mutation analysis And liver function test.   Follow-up:  4 m Next remote:    Current medicines are reviewed at length with the patient today.   The patient  concerns regarding her medicines.  The following changes were made today:  none  Labs/ tests ordered today include:   No orders of the defined types were placed in this encounter.     Patient Risk:  after full review of this patients clinical status, I feel that they are at moderate risk at this time.  Today, I have spent 17 minutes with the patient with telehealth technology discussing (As above)  .    Signed, Leonides Sake, MD  12/28/2022 2:17 PM     [facility] [address] [place] [phone] (office) [phone] (fax)

## 2022-12-29 ENCOUNTER — Ambulatory Visit (INDEPENDENT_AMBULATORY_CARE_PROVIDER_SITE_OTHER): Payer: Medicaid Other | Admitting: Family

## 2022-12-29 ENCOUNTER — Ambulatory Visit (INDEPENDENT_AMBULATORY_CARE_PROVIDER_SITE_OTHER): Payer: Medicaid Other

## 2022-12-29 ENCOUNTER — Encounter (HOSPITAL_BASED_OUTPATIENT_CLINIC_OR_DEPARTMENT_OTHER): Payer: Self-pay | Admitting: Family

## 2022-12-29 VITALS — BP 116/72 | HR 96 | Ht 59.0 in | Wt 249.9 lb

## 2022-12-29 DIAGNOSIS — Q211 Atrial septal defect, unspecified: Secondary | ICD-10-CM

## 2022-12-29 DIAGNOSIS — R002 Palpitations: Secondary | ICD-10-CM | POA: Diagnosis not present

## 2022-12-29 DIAGNOSIS — I10 Essential (primary) hypertension: Secondary | ICD-10-CM | POA: Diagnosis not present

## 2022-12-29 DIAGNOSIS — R232 Flushing: Secondary | ICD-10-CM

## 2022-12-29 DIAGNOSIS — I951 Orthostatic hypotension: Secondary | ICD-10-CM | POA: Diagnosis not present

## 2022-12-29 LAB — ECHOCARDIOGRAM COMPLETE
Height: 59 in
S' Lateral: 1.98 cm
Weight: 3998.4 oz

## 2022-12-29 NOTE — Patient Instructions (Signed)
Medication Instructions:  Continue your current medications.   We will reach out in a week to check in on your blood pressure.   *If you need a refill on your cardiac medications before your next appointment, please call your pharmacy*  Follow-Up: At [facility], you and your health needs are our priority.  As part of our continuing mission to provide you with exceptional heart care, we have created designated Provider Care Teams.  These Care Teams include your primary Cardiologist (physician) and Advanced Practice Providers (APPs -  Physician Assistants and Nurse Practitioners) who all work together to provide you with the care you need, when you need it.  We recommend signing up for the patient portal called "MyChart".  Sign up information is provided on this After Visit Summary.  MyChart is used to connect with patients for Virtual Visits (Telemedicine).  Patients are able to view lab/test results, encounter notes, upcoming appointments, etc.  Non-urgent messages can be sent to your provider as well.   To learn more about what you can do with MyChart, go to [email or web address].    Your next appointment:   2-3 month(s) with Skeet Latch, MD or Laurann Montana, NP  in [facility]  Other Instructions  Your echocardiogram results will be available in [place]. The results go to the provider at the same time they come to your MyChart so if you do not see a note explaining what the results mean, your provider has not yet reviewed and it will be available as soon as they are able.   We will refer you to endocrinology for further workup of your urine normetanephrines and consideration of further testing for episodes.

## 2022-12-29 NOTE — Progress Notes (Unsigned)
Advanced Hypertension Clinic Assessment:    Date:  12/29/2022   ID:  Molly [name], DOB June 21, 2001, MRN [id number]  PCP:  Sonny Masters, FNP  Cardiologist:  None  Nephrologist:  Referring MD: Sonny Masters, FNP   CC: Hypertension  History of Present Illness:    Molly [name] is a 22 y.o. female with a hx of ASD s/p repair 2012, POTS, HTN, HLD, asthma, migraines, gastritis, hiatal hernia, scoliosis, obesity, ADHD, PTSD, anxiety, depression, suicidal ideation here to follow up in the Advanced Hypertension Clinic.   Previously has followed with Dr. Graciela Husbands for palpitations and intermittent attacks of diaphoresis with shaking and cold hands with sensation of being flushed.  Thyroid function normal.  Monitor November 2023 average heart rate of 109 bpm with rare PAC and PVC.  Prior Cusings workup 2017-2018 with pediatric endocrinology at [facility]. Her brother has low cortisol and her mother has hypothyroidism.   Established with Dr. Duke Salvia and hypertension clinic 11/22/2022.  Her blood pressure was uncontrolled and bisoprolol was increased to 10 mg. 12/01/22 cortisol 0.4.Marland Kitchen 24h urine normetanephrines 451 but otherwise unremarkable. CT abdomen 12/20/22 normal adrenal glands. Scattered mesenteric and right lower quadrant l ymph nodes not pathologically enlarged, likely reactive.  Had telephone visit yesterday with Dr. Graciela Husbands who suggested possible work up for mastocytosis or carcinoid. Echocardiogram performed today but not yet resulted.  Presents today for follow up with her mother. 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 Since last seen she feels okay. She notes she is having an "attack "1-3 times per day. Usually initial symptom of shaking, dizziness, face, flushed, hot feeling, sweating. Notes since the increased dose of the Bisoprolol her heart rate note jumping only up to 130s and previously up to 180s. BP at home checked intermittently with  readings such as 158/83, 139/91. Her mother notes she often does not sit down long prior to checking blood pressure. Notes easy fatigue even with small bits of activity and has appt to establish with pulmonology to discuss possible sleep apnea next week.   Just saw GI Monday. She was given some next steps for nausea, constipation, GERD. Known hiatal hernia.   She monitors her blood pressure at home: 158/83, 139/91,   Previous antihypertensives: Amlodipine Lisinopril  Past Medical History:  Diagnosis Date   Acid reflux 2010   Acne vulgaris 2017   ADHD (attention deficit hyperactivity disorder) 2011   Adverse food reaction 07/11/2016   Shellfish allergy   Amenorrhea 2019   Anaphylactic shock due to adverse food reaction 01/15/2018   Anxiety 2017   ASD (atrial septal defect) 11/22/2022   Chronic constipation 09/05/2011   Chronic tension headaches 2011   originally followed by [facility] K.Griffin. Then Dr Alexis Goodell [facility] 03/2020 (see above)    Coalition, calcaneus navicular    Dysmenorrhea    Eczema    Eustachian tube dysfunction, left 10/23/2019   Frequent headaches    Galactorrhea    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 04/01/2019   Hiatal hernia    History of atrial septal defect repair 12/04/2011   Hyperlipidemia 2013   Hypertension 2011   Hypertension 09/28/2020   hypertension, cellulitus    Idiopathic urticaria 09/05/2011   Insomnia    Keratosis pilaris 01/15/2018   Mass of left thigh 11/22/2017   Overview:  Added automatically from request for surgery [id number]   MDD (major depressive disorder), severe (HCC) 03/06/2018   Migraine 2011   re-diagnosed as chronic migraines  03/2020 [facility] - starting on preventative    Obesity without serious comorbidity with body mass index (BMI) in 95th to 98th percentile for age in pediatric patient 09/04/2018   Piriformis syndrome    POTS (postural orthostatic tachycardia syndrome)    PTSD (post-traumatic stress disorder)    Scoliosis  2019   Seasonal and perennial allergic rhinitis 2010   Severe persistent asthma without complication 2010   Sleep paralysis    Suicidal ideation 03/07/2018   Tic disorder 2019    Past Surgical History:  Procedure Laterality Date   ASD REPAIR  2012   with Helix, via Cardiac Catheterization   atrial septal defecgt     CARDIAC SURGERY     DENTAL SURGERY  2020   lipoma removal  2019   LUMBAR PUNCTURE  05/20/2021   TONSILLECTOMY AND ADENOIDECTOMY  2010    Current Medications: Current Meds  Medication Sig   albuterol (PROVENTIL) (2.5 MG/3ML) 0.083% nebulizer solution Take 3 mLs (2.5 mg total) by nebulization every 6 (six) hours as needed for wheezing or shortness of breath.   ascorbic acid (VITAMIN C) 500 MG tablet Take 500 mg by mouth daily.   atomoxetine (STRATTERA) 60 MG capsule Take 1 capsule (60 mg total) by mouth daily.   Azelastine-Fluticasone 137-50 MCG/ACT SUSP Place 1 spray into both nostrils 2 (two) times daily.   bisoprolol (ZEBETA) 10 MG tablet Take 1 tablet (10 mg total) by mouth daily.   budesonide-formoterol (SYMBICORT) 160-4.5 MCG/ACT inhaler Inhale 2 puffs into the lungs in the morning and at bedtime. (Patient taking differently: Inhale 1 puff into the lungs every morning.)   busPIRone (BUSPAR) 10 MG tablet Take 1 tablet (10 mg total) by mouth 2 (two) times daily.   calcium carbonate (OS-CAL) 1250 (500 Ca) MG chewable tablet Chew by mouth.   cariprazine (VRAYLAR) 1.5 MG capsule Take 1 capsule (1.5 mg total) by mouth daily.   Cholecalciferol 25 MCG (1000 UT) tablet Take 1 Units by mouth daily at 2 PM.   clobetasol (TEMOVATE) 0.05 % external solution APPLY TO AFFECTED AREA TWICE A DAY   clonazePAM (KLONOPIN) 0.5 MG tablet Take 1 tablet (0.5 mg total) by mouth 2 (two) times daily as needed for anxiety.   EPINEPHrine 0.3 mg/0.3 mL IJ SOAJ injection Inject 0.3 mg into the muscle as needed for anaphylaxis.   fexofenadine (ALLEGRA) 180 MG tablet Take 1 tablet (180 mg total)  by mouth daily.   Galcanezumab-gnlm 120 MG/ML SOAJ Inject into the skin.   hydrOXYzine (VISTARIL) 25 MG capsule Take 1-2 capsules (25-50 mg total) by mouth daily as needed.   hyoscyamine (LEVSIN SL) 0.125 MG SL tablet Take by mouth as needed.   LINZESS 72 MCG capsule Take 72 mcg by mouth daily.   methocarbamol (ROBAXIN) 500 MG tablet Take 1 tablet (500 mg total) by mouth every 8 (eight) hours as needed for muscle spasms.   metoprolol tartrate (LOPRESSOR) 25 MG tablet Take 1 tablet (25 mg total) by mouth as needed.   montelukast (SINGULAIR) 10 MG tablet Take 1 tablet (10 mg total) by mouth daily.   Multiple Vitamin (MULTIVITAMIN) capsule Take by mouth.   norethindrone (AYGESTIN) 5 MG tablet Take 5 mg by mouth daily.   nystatin (MYCOSTATIN/NYSTOP) powder Apply 1 Application topically 3 (three) times daily. (Patient taking differently: Apply 1 Application topically as needed.)   nystatin cream (MYCOSTATIN) APPLY TO AFFECTED AREA TWICE A DAY (Patient taking differently: as needed.)   Olopatadine HCl 0.2 % SOLN Apply  1 drop to eye every morning.   ondansetron (ZOFRAN-ODT) 4 MG disintegrating tablet DISSOLVE ON TONGUE AT ONSET OF NAUSEA- 30 DAY SUPPLY   pantoprazole (PROTONIX) 40 MG tablet Take 1 tablet by mouth in the morning and at bedtime.   RESTASIS 0.05 % ophthalmic emulsion 1 drop 2 (two) times daily.   sertraline (ZOLOFT) 50 MG tablet Take 1.5 tablets (75 mg total) by mouth daily.   SUMAtriptan (IMITREX) 100 MG tablet Take by mouth.   VENTOLIN HFA 108 (90 Base) MCG/ACT inhaler INHALE 2 PUFFS INTO THE LUNGS EVERY 4 HOURS AS NEEDED FOR WHEEZING OR SHORTNESS OF BREATH.     Allergies:   Glucosamine forte [nutritional supplements], Nutritional supplements, Oyster extract, Shellfish-derived products, Cats claw (uncaria tomentosa), Diamox [acetazolamide], Lactose, Lactose intolerance (gi), Topiramate, and Bromfed dm [pseudoeph-bromphen-dm]   Social History   Socioeconomic History   Marital  status: Single    Spouse name: Not on file   Number of children: Not on file   Years of education: Not on file   Highest education level: Not on file  Occupational History   Not on file  Tobacco Use   Smoking status: Never   Smokeless tobacco: Never  Vaping Use   Vaping Use: Never used  Substance and Sexual Activity   Alcohol use: No    Alcohol/week: 0.0 standard drinks of alcohol   Drug use: No   Sexual activity: Yes  Other Topics Concern   Not on file  Social History Narrative   Not on file   Social Determinants of Health   Financial Resource Strain: Low Risk  (11/22/2022)   Overall Financial Resource Strain (CARDIA)    Difficulty of Paying Living Expenses: Not very hard  Food Insecurity: No Food Insecurity (11/22/2022)   Hunger Vital Sign    Worried About Running Out of Food in the Last Year: Never true    Ran Out of Food in the Last Year: Never true  Transportation Needs: No Transportation Needs (11/22/2022)   PRAPARE - Hydrologist (Medical): No    Lack of Transportation (Non-Medical): No  Physical Activity: Not on file  Stress: Not on file  Social Connections: Not on file     Family History: The patient's family history includes ADD / ADHD in her brother and father; Alcohol abuse in her father; Allergic rhinitis in her brother and mother; Anxiety disorder in her mother; Arthritis in her mother; Asthma in her brother and mother; Bipolar disorder in her father, maternal grandfather, and mother; Depression in her mother; Diabetes in her mother; Drug abuse in her father; Heart attack in her maternal grandfather; Heart disease in her maternal grandmother; Hyperlipidemia in her maternal grandmother and mother; Hypertension in her maternal grandmother and mother; Learning disabilities in her brother; Mental illness in her father and mother; Miscarriages / [place] in her mother; Thyroid disease in her mother. There is no history of Angioedema,  Eczema, Immunodeficiency, or Urticaria.  ROS:   Please see the history of present illness.     All other systems reviewed and are negative.  EKGs/Labs/Other Studies Reviewed:    EKG:  EKG is not ordered today.    Recent Labs: 03/06/2022: TSH 1.550 10/11/2022: ALT 34; BUN 5; Creatinine, Ser 0.57; Hemoglobin 13.6; Platelets 286; Potassium 4.0; Sodium 142   Recent Lipid Panel    Component Value Date/Time   CHOL 191 (H) 10/11/2022 1556   TRIG 188 (H) 10/11/2022 1556   HDL 37 (L) 10/11/2022 1556  CHOLHDL 5.2 (H) 10/11/2022 1556   CHOLHDL 3.3 03/07/2018 0713   VLDL 20 03/07/2018 0713   LDLCALC 121 (H) 10/11/2022 1556   Physical Exam:   VS:  BP 116/72 (BP Location: Left Arm, Patient Position: Sitting, Cuff Size: Normal)   Pulse 96   Ht 4\' 11"  (1.499 m)   Wt 249 lb 14.4 oz (113.4 kg)   LMP  (LMP Unknown) Comment: last cycle in 2020  SpO2 96%   BMI 50.47 kg/m  , BMI Body mass index is 50.47 kg/m. GENERAL:  Well appearing, overweight HEENT: Pupils equal round and reactive, fundi not visualized, oral mucosa unremarkable NECK:  No jugular venous distention, waveform within normal limits, carotid upstroke brisk and symmetric, no bruits, no thyromegaly LYMPHATICS:  No cervical adenopathy LUNGS:  Clear to auscultation bilaterally HEART:  RRR.  PMI not displaced or sustained,S1 and S2 within normal limits, no S3, no S4, no clicks, no rubs, no murmurs ABD:  Flat, positive bowel sounds normal in frequency in pitch, no bruits, no rebound, no guarding, no midline pulsatile mass, no hepatomegaly, no splenomegaly EXT:  2 plus pulses throughout, no edema, no cyanosis no clubbing SKIN:  No rashes no nodules NEURO:  Cranial nerves II through XII grossly intact, motor grossly intact throughout PSYCH:  Cognitively intact, oriented to person place and time   ASSESSMENT/PLAN:    HTN -   Spells with flushing episodes / Palpitations / Tachycardia -   ?OSA - Upcoming OV with Dr. Melvyn Novas.    Screening for Secondary Hypertension: { Click here to document screening for secondary causes of HTN  :[id number]}    11/22/2022    3:55 PM  Causes  Drugs/Herbals Screened     - Comments No caffeine or energy drinks.    Relevant Labs/Studies:    Latest Ref Rng & Units 10/11/2022    3:56 PM 03/06/2022   11:02 AM 11/26/2019    9:46 AM  Basic Labs  Sodium 134 - 144 mmol/L 142  143  146   Potassium 3.5 - 5.2 mmol/L 4.0  4.5  4.2   Creatinine 0.57 - 1.00 mg/dL 0.57  0.77  0.72        Latest Ref Rng & Units 03/06/2022   11:02 AM 03/07/2018    7:13 AM  Thyroid   TSH 0.450 - 4.500 uIU/mL 1.550  2.434                  Disposition:    FU with MD/PharmD in {gen number [id number]} {Days to years:10300}    Medication Adjustments/Labs and Tests Ordered: Current medicines are reviewed at length with the patient today.  Concerns regarding medicines are outlined above.  Orders Placed This Encounter  Procedures   Ambulatory referral to Endocrinology   No orders of the defined types were placed in this encounter.    Signed, Loel Dubonnet, NP  12/29/2022 5:00 PM    [place]

## 2022-12-31 ENCOUNTER — Encounter (HOSPITAL_BASED_OUTPATIENT_CLINIC_OR_DEPARTMENT_OTHER): Payer: Self-pay | Admitting: Family

## 2023-01-01 ENCOUNTER — Encounter: Payer: Self-pay | Admitting: Internal Medicine

## 2023-01-01 ENCOUNTER — Telehealth (INDEPENDENT_AMBULATORY_CARE_PROVIDER_SITE_OTHER): Payer: Medicaid Other | Admitting: Psychiatry

## 2023-01-01 ENCOUNTER — Telehealth: Payer: Self-pay | Admitting: Cardiovascular Disease

## 2023-01-01 ENCOUNTER — Encounter (HOSPITAL_BASED_OUTPATIENT_CLINIC_OR_DEPARTMENT_OTHER): Payer: Self-pay

## 2023-01-01 ENCOUNTER — Encounter (HOSPITAL_COMMUNITY): Payer: Self-pay | Admitting: Psychiatry

## 2023-01-01 DIAGNOSIS — F902 Attention-deficit hyperactivity disorder, combined type: Secondary | ICD-10-CM | POA: Diagnosis not present

## 2023-01-01 DIAGNOSIS — F333 Major depressive disorder, recurrent, severe with psychotic symptoms: Secondary | ICD-10-CM | POA: Diagnosis not present

## 2023-01-01 MED ORDER — SERTRALINE HCL 50 MG PO TABS: 75.0000 mg | ORAL_TABLET | Freq: Every day | ORAL | 2 refills | Status: DC

## 2023-01-01 MED ORDER — CARIPRAZINE HCL 1.5 MG PO CAPS: 1.5000 mg | ORAL_CAPSULE | Freq: Every day | ORAL | 2 refills | Status: DC

## 2023-01-01 MED ORDER — ATOMOXETINE HCL 60 MG PO CAPS: 60.0000 mg | ORAL_CAPSULE | Freq: Every day | ORAL | 2 refills | Status: DC

## 2023-01-01 MED ORDER — HYDROXYZINE PAMOATE 25 MG PO CAPS: 25.0000 mg | ORAL_CAPSULE | Freq: Every day | ORAL | 2 refills | Status: DC | PRN

## 2023-01-01 MED ORDER — BUSPIRONE HCL 10 MG PO TABS: 10.0000 mg | ORAL_TABLET | Freq: Two times a day (BID) | ORAL | 2 refills | Status: DC

## 2023-01-01 NOTE — Telephone Encounter (Signed)
Returned call to patient, she is trying to reach Freddy Jaksch from Dr. Aquilla Hacker office, will route this message to her so she can follow up with patients mother!

## 2023-01-01 NOTE — Progress Notes (Signed)
Virtual Visit via Video Note  I connected with [name] on 01/01/23 at  1:40 PM EST by a video enabled telemedicine application and verified that I am speaking with the correct person using two identifiers.  Location: Patient: home Provider: office   I discussed the limitations of evaluation and management by telemedicine and the availability of in person appointments. The patient expressed understanding and agreed to proceed.      I discussed the assessment and treatment plan with the patient. The patient was provided an opportunity to ask questions and all were answered. The patient agreed with the plan and demonstrated an understanding of the instructions.   The patient was advised to call back or seek an in-person evaluation if the symptoms worsen or if the condition fails to improve as anticipated.  I provided 20 minutes of non-face-to-face time during this encounter.   [name], MD  [facility] MD/PA/NP OP Progress Note  01/01/2023 1:55 PM KAYLAMARIE SWICKARD  MRN:  [id number]  Chief Complaint:  Chief Complaint  Patient presents with   Anxiety   Depression   ADD   Follow-up   HPI: This patient is a 22 year old female who lives with her mother, mother's fianc and a younger brother in [place].  She is attending an Education officer, museum for [employer], forensic.  The patient returns for follow-up after 2 months.  She continues to do well in her classes.  She still having episodes several times a day of feeling dizzy and lightheaded with excessive heart rate.  This is being investigated by cardiology but nothing has shown up.  Her cortisol and epinephrine levels have all been normal.  She is now being referred to endocrinology.  She does not think these are anxiety related as they seem to come out of nowhere.  She also has a lot of trouble with fatigue and chronic myalgias.  In terms of mood however she has been doing well.  She denies significant depression  anxiety or thoughts of self-harm.  She is sleeping well and her energy is fair.  She does get fatigued very easily.  She still feels all of her medications have been helpful but she is not using the clonazepam. Visit Diagnosis:    ICD-10-CM   1. Attention deficit hyperactivity disorder (ADHD), combined type  F90.2     2. Severe episode of recurrent major depressive disorder, with psychotic features (HCC)  F33.3       Past Psychiatric History: Prior psychiatric hospitalizations, [facility]  Past Medical History:  Past Medical History:  Diagnosis Date   Acid reflux 2010   Acne vulgaris 2017   ADHD (attention deficit hyperactivity disorder) 2011   Adverse food reaction 07/11/2016   Shellfish allergy   Amenorrhea 2019   Anaphylactic shock due to adverse food reaction 01/15/2018   Anxiety 2017   ASD (atrial septal defect) 11/22/2022   Chronic constipation 09/05/2011   Chronic tension headaches 2011   originally followed by [facility] K.Griffin. Then Dr Alexis Goodell [facility] 03/2020 (see above)    Coalition, calcaneus navicular    Dysmenorrhea    Eczema    Eustachian tube dysfunction, left 10/23/2019   Frequent headaches    Galactorrhea    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 04/01/2019   Hiatal hernia    History of atrial septal defect repair 12/04/2011   Hyperlipidemia 2013   Hypertension 2011   Hypertension 09/28/2020   hypertension, cellulitus    Idiopathic urticaria  09/05/2011   Insomnia    Keratosis pilaris 01/15/2018   Mass of left thigh 11/22/2017   Overview:  Added automatically from request for surgery [id number]   MDD (major depressive disorder), severe ([place]) 03/06/2018   Migraine 2011   re-diagnosed as chronic migraines 03/2020 [facility] - starting on preventative    Obesity without serious comorbidity with body mass index (BMI) in 95th to 98th percentile for age in pediatric patient 09/04/2018   Piriformis syndrome    POTS (postural orthostatic  tachycardia syndrome)    PTSD (post-traumatic stress disorder)    Scoliosis 2019   Seasonal and perennial allergic rhinitis 2010   Severe persistent asthma without complication 6789   Sleep paralysis    Suicidal ideation 03/07/2018   Tic disorder 2019    Past Surgical History:  Procedure Laterality Date   ASD REPAIR  2012   with Helix, via Cardiac Catheterization   atrial septal defecgt     CARDIAC SURGERY     DENTAL SURGERY  2020   lipoma removal  2019   LUMBAR PUNCTURE  05/20/2021   TONSILLECTOMY AND ADENOIDECTOMY  2010    Family Psychiatric History: See below  Family History:  Family History  Problem Relation Age of Onset   Hypertension Mother    Hyperlipidemia Mother    Diabetes Mother    Depression Mother    Arthritis Mother    Allergic rhinitis Mother    Asthma Mother    Bipolar disorder Mother    Anxiety disorder Mother    Mental illness Mother    Miscarriages / [place] Mother    Thyroid disease Mother    Bipolar disorder Father    Drug abuse Father    Alcohol abuse Father    ADD / ADHD Father    Mental illness Father    Allergic rhinitis Brother    Asthma Brother    ADD / ADHD Brother    Learning disabilities Brother    Hypertension Maternal Grandmother    Hyperlipidemia Maternal Grandmother    Heart disease Maternal Grandmother    Heart attack Maternal Grandfather    Bipolar disorder Maternal Grandfather    Angioedema Neg Hx    Eczema Neg Hx    Immunodeficiency Neg Hx    Urticaria Neg Hx     Social History:  Social History   Socioeconomic History   Marital status: Single    Spouse name: Not on file   Number of children: Not on file   Years of education: Not on file   Highest education level: Not on file  Occupational History   Not on file  Tobacco Use   Smoking status: Never   Smokeless tobacco: Never  Vaping Use   Vaping Use: Never used  Substance and Sexual Activity   Alcohol use: No    Alcohol/week: 0.0 standard drinks of  alcohol   Drug use: No   Sexual activity: Yes  Other Topics Concern   Not on file  Social History Narrative   Not on file   Social Determinants of Health   Financial Resource Strain: Low Risk  (11/22/2022)   Overall Financial Resource Strain (CARDIA)    Difficulty of Paying Living Expenses: Not very hard  Food Insecurity: No Food Insecurity (11/22/2022)   Hunger Vital Sign    Worried About Running Out of Food in the Last Year: Never true    Ran Out of Food in the Last Year: Never true  Transportation Needs: No Transportation Needs (11/22/2022)  PRAPARE - Hydrologist (Medical): No    Lack of Transportation (Non-Medical): No  Physical Activity: Not on file  Stress: Not on file  Social Connections: Not on file    Allergies:  Allergies  Allergen Reactions   Glucosamine Forte [Nutritional Supplements] Anaphylaxis    Due to containing shellfish   Nutritional Supplements Anaphylaxis   Oyster Extract Anaphylaxis   Shellfish-Derived Products Anaphylaxis    Throat swelling   Cats Claw (Uncaria Tomentosa) Nausea And Vomiting   Diamox [Acetazolamide] Other (See Comments)    Numbness- feet, hands, lips   Lactose    Lactose Intolerance (Gi)    Topiramate Other (See Comments)    Numbness to face, hands and feet   Bromfed Dm [Pseudoeph-Bromphen-Dm] Anxiety    CARBOFED DM    Metabolic Disorder Labs: Lab Results  Component Value Date   HGBA1C 5.8 (H) 10/11/2022   MPG 105.41 03/07/2018   Lab Results  Component Value Date   PROLACTIN 49.7 (H) 03/07/2018   Lab Results  Component Value Date   CHOL 191 (H) 10/11/2022   TRIG 188 (H) 10/11/2022   HDL 37 (L) 10/11/2022   CHOLHDL 5.2 (H) 10/11/2022   VLDL 20 03/07/2018   LDLCALC 121 (H) 10/11/2022   LDLCALC 138 (H) 03/06/2022   Lab Results  Component Value Date   TSH 1.550 03/06/2022   TSH 2.434 03/07/2018    Therapeutic Level Labs: No results found for: "LITHIUM" No results found for:  "VALPROATE" No results found for: "CBMZ"  Current Medications: Current Outpatient Medications  Medication Sig Dispense Refill   albuterol (PROVENTIL) (2.5 MG/3ML) 0.083% nebulizer solution Take 3 mLs (2.5 mg total) by nebulization every 6 (six) hours as needed for wheezing or shortness of breath. 75 mL 1   ascorbic acid (VITAMIN C) 500 MG tablet Take 500 mg by mouth daily.     atomoxetine (STRATTERA) 60 MG capsule Take 1 capsule (60 mg total) by mouth daily. 30 capsule 2   Azelastine-Fluticasone 137-50 MCG/ACT SUSP Place 1 spray into both nostrils 2 (two) times daily. 23 g 5   bisoprolol (ZEBETA) 10 MG tablet Take 1 tablet (10 mg total) by mouth daily. 90 tablet 1   budesonide-formoterol (SYMBICORT) 160-4.5 MCG/ACT inhaler Inhale 2 puffs into the lungs in the morning and at bedtime. (Patient taking differently: Inhale 1 puff into the lungs every morning.) 1 each 5   busPIRone (BUSPAR) 10 MG tablet Take 1 tablet (10 mg total) by mouth 2 (two) times daily. 60 tablet 2   calcium carbonate (OS-CAL) 1250 (500 Ca) MG chewable tablet Chew by mouth.     cariprazine (VRAYLAR) 1.5 MG capsule Take 1 capsule (1.5 mg total) by mouth daily. 30 capsule 2   Cholecalciferol 25 MCG (1000 UT) tablet Take 1 Units by mouth daily at 2 PM.     clobetasol (TEMOVATE) 0.05 % external solution APPLY TO AFFECTED AREA TWICE A DAY 50 mL 3   clonazePAM (KLONOPIN) 0.5 MG tablet Take 1 tablet (0.5 mg total) by mouth 2 (two) times daily as needed for anxiety. 60 tablet 2   EPINEPHrine 0.3 mg/0.3 mL IJ SOAJ injection Inject 0.3 mg into the muscle as needed for anaphylaxis. 2 each 2   fexofenadine (ALLEGRA) 180 MG tablet Take 1 tablet (180 mg total) by mouth daily. 30 tablet 5   Galcanezumab-gnlm 120 MG/ML SOAJ Inject into the skin.     hydrOXYzine (VISTARIL) 25 MG capsule Take 1-2 capsules (25-50 mg total)  by mouth daily as needed. 60 capsule 2   hyoscyamine (LEVSIN SL) 0.125 MG SL tablet Take by mouth as needed.     LINZESS 72  MCG capsule Take 72 mcg by mouth daily.     methocarbamol (ROBAXIN) 500 MG tablet Take 1 tablet (500 mg total) by mouth every 8 (eight) hours as needed for muscle spasms. 30 tablet 0   metoprolol tartrate (LOPRESSOR) 25 MG tablet Take 1 tablet (25 mg total) by mouth as needed. 15 tablet 1   montelukast (SINGULAIR) 10 MG tablet Take 1 tablet (10 mg total) by mouth daily. 30 tablet 5   Multiple Vitamin (MULTIVITAMIN) capsule Take by mouth.     norethindrone (AYGESTIN) 5 MG tablet Take 5 mg by mouth daily.     nystatin (MYCOSTATIN/NYSTOP) powder Apply 1 Application topically 3 (three) times daily. (Patient taking differently: Apply 1 Application topically as needed.) 15 g 0   nystatin cream (MYCOSTATIN) APPLY TO AFFECTED AREA TWICE A DAY (Patient taking differently: as needed.) 60 g 1   Olopatadine HCl 0.2 % SOLN Apply 1 drop to eye every morning.     ondansetron (ZOFRAN-ODT) 4 MG disintegrating tablet DISSOLVE ON TONGUE AT ONSET OF NAUSEA- 30 DAY SUPPLY 10 tablet 1   pantoprazole (PROTONIX) 40 MG tablet Take 1 tablet by mouth in the morning and at bedtime.     RESTASIS 0.05 % ophthalmic emulsion 1 drop 2 (two) times daily.     sertraline (ZOLOFT) 50 MG tablet Take 1.5 tablets (75 mg total) by mouth daily. 45 tablet 2   SUMAtriptan (IMITREX) 100 MG tablet Take by mouth.     VENTOLIN HFA 108 (90 Base) MCG/ACT inhaler INHALE 2 PUFFS INTO THE LUNGS EVERY 4 HOURS AS NEEDED FOR WHEEZING OR SHORTNESS OF BREATH. 18 each 1   No current facility-administered medications for this visit.     Musculoskeletal: Strength & Muscle Tone: within normal limits Gait & Station: normal Patient leans: N/A  Psychiatric Specialty Exam: Review of Systems  Musculoskeletal:  Positive for arthralgias and myalgias.  Neurological:  Positive for weakness and light-headedness.  All other systems reviewed and are negative.   There were no vitals taken for this visit.There is no height or weight on file to calculate BMI.   General Appearance: Casual and Fairly Groomed  Eye Contact:  Good  Speech:  Clear and Coherent  Volume:  Normal  Mood:  Euthymic  Affect:  Congruent  Thought Process:  Goal Directed  Orientation:  Full (Time, Place, and Person)  Thought Content: WDL   Suicidal Thoughts:  No  Homicidal Thoughts:  No  Memory:  Immediate;   Good Recent;   Good Remote;   Fair  Judgement:  Good  Insight:  Fair  Psychomotor Activity:  Decreased  Concentration:  Concentration: Good and Attention Span: Good  Recall:  Good  Fund of Knowledge: Good  Language: Good  Akathisia:  No  Handed:  Right  AIMS (if indicated): not done  Assets:  Communication Skills Desire for Improvement Resilience Social Support  ADL's:  Intact  Cognition: WNL  Sleep:  Good   Screenings: GAD-7    Flowsheet Row Office Visit from 11/07/2022 in [facility] Office Visit from 10/11/2022 in [facility] Office Visit from 09/07/2022 in [facility] Office Visit from 08/15/2022 in [facility] Office Visit from 07/11/2022 in [facility]  Total GAD-7  Score 0 4 0 1 2      PHQ2-9    Flowsheet Row Office Visit from 11/07/2022 in [facility] Office Visit from 10/11/2022 in [facility] Office Visit from 09/07/2022 in [facility] Video Visit from 08/22/2022 in [facility] Visit from 08/15/2022 in [facility]  PHQ-2 Total Score 0 1 0 0 0  PHQ-9 Total Score 1 4 2  -- 2      Flowsheet Row Video Visit from 08/22/2022 in [facility] [place] Video Visit from 06/23/2022 in [facility] [place] Video Visit from 04/19/2022 in [facility] [place]  C-SSRS RISK CATEGORY No Risk No Risk No Risk        Assessment and Plan:  This patient is a 22 year old female with a history of depression anxiety possible bipolar disorder sleep difficulties ADD possible fibromyalgia chronic fatigue among a host of other medical issues.  However she is doing well in terms of mental status.  She will continue Strattera 60 mg daily for focus, Vraylar 1.5 mg daily for mood stabilization Zoloft 75 mg daily for depression and anxiety, BuSpar 10 mg twice daily for anxiety and hydroxyzine 25 to 50 mg at bedtime as needed for sleep.  She will return to see me in 2 months Collaboration of Care: Collaboration of Care: Primary Care Provider AEB notes are shared with PCP on the epic system  Patient/Guardian was advised Release of Information must be obtained prior to any record release in order to collaborate their care with an outside provider. Patient/Guardian was advised if they have not already done so to contact the registration department to sign all necessary forms in order for 12 to release information regarding their care.   Consent: Patient/Guardian gives verbal consent for treatment and assignment of benefits for services provided during this visit. Patient/Guardian expressed understanding and agreed to proceed.    [place], MD 01/01/2023, 1:55 PM

## 2023-01-01 NOTE — Telephone Encounter (Signed)
[name] mother is returning call. Requesting return call.

## 2023-01-03 NOTE — Telephone Encounter (Signed)
Spoke with patients mother who stated daughter never received AVS from last visit telephone with Dr Caryl Comes.   1)Patient unsure when needs to follow up and if in person or virtual   2)Per mother patient was asked how the Robinul was working, has not been sent to pharmacy   Feels like Dr Caryl Comes was going to speak with Dr Oval Linsey regarding some things and has not heard back   Will forward to Dr Caryl Comes and Pia Mau RN

## 2023-01-03 NOTE — Telephone Encounter (Signed)
Left message to call back  

## 2023-01-04 ENCOUNTER — Institutional Professional Consult (permissible substitution): Payer: Medicaid Other | Admitting: Internal Medicine

## 2023-01-05 ENCOUNTER — Encounter (HOSPITAL_BASED_OUTPATIENT_CLINIC_OR_DEPARTMENT_OTHER): Payer: Self-pay

## 2023-01-05 ENCOUNTER — Other Ambulatory Visit: Payer: Self-pay | Admitting: Allergy & Immunology

## 2023-01-09 NOTE — Telephone Encounter (Signed)
BP log as requested 

## 2023-01-12 ENCOUNTER — Other Ambulatory Visit: Payer: Self-pay | Admitting: Family Medicine

## 2023-01-12 ENCOUNTER — Ambulatory Visit (INDEPENDENT_AMBULATORY_CARE_PROVIDER_SITE_OTHER): Payer: Medicaid Other | Admitting: Family Medicine

## 2023-01-12 ENCOUNTER — Encounter: Payer: Self-pay | Admitting: Family Medicine

## 2023-01-12 VITALS — BP 136/79 | HR 99 | Temp 97.0°F | Ht 59.0 in | Wt 250.8 lb

## 2023-01-12 DIAGNOSIS — R61 Generalized hyperhidrosis: Secondary | ICD-10-CM | POA: Diagnosis not present

## 2023-01-12 DIAGNOSIS — L282 Other prurigo: Secondary | ICD-10-CM | POA: Diagnosis not present

## 2023-01-12 DIAGNOSIS — R21 Rash and other nonspecific skin eruption: Secondary | ICD-10-CM | POA: Diagnosis not present

## 2023-01-12 MED ORDER — MUPIROCIN CALCIUM 2 % EX CREA: 1.0000 | TOPICAL_CREAM | Freq: Two times a day (BID) | CUTANEOUS | 0 refills | Status: DC

## 2023-01-12 MED ORDER — ALUMINUM CHLORIDE HEXAHYDRATE POWD: 1.0000 | Freq: Two times a day (BID) | 6 refills | Status: DC

## 2023-01-12 NOTE — Progress Notes (Signed)
Subjective:  Patient ID: Molly [name], female    DOB: 2001-02-05, 22 y.o.   MRN: [id number]  Patient Care Team: Baruch Gouty, FNP as PCP - General (Family Medicine) Ernst Bowler Gwenith Daily, MD as Consulting Physician (Allergy and Immunology) Cloria Spring, MD as Consulting Physician ([place]) Timoteo Gaul, MD as Consulting Physician (Neurology) Lillia Mountain, Cindee Lame, MD as Consulting Physician (Pediatric Gastroenterology)   Chief Complaint:  follow up abcess (1 month follow up- patient states they are better ) and Rash (Right axilla x 1 week. States she shaved and it got better but then worse again .)   HPI: Molly [name] is a 22 y.o. female presenting on 01/12/2023 for follow up abcess (1 month follow up- patient states they are better ) and Rash (Right axilla x 1 week. States she shaved and it got better but then worse again .)   Pt presents today for rash to right axilla. States this has been present for a few weeks. Will improve and then returns after shaving. She has tried Nystatin and Hydrocortisone cream without relief of symptoms. No drainage. Does report she has significant sweating and only uses deodorant and not an antiperspirant.   Rash This is a recurrent problem. The problem has been waxing and waning since onset. The affected locations include the left axilla and right axilla. The rash is characterized by peeling and redness. She was exposed to nothing. Pertinent negatives include no anorexia, congestion, cough, diarrhea, eye pain, facial edema, fatigue, fever, joint pain, nail changes, rhinorrhea, shortness of breath, sore throat or vomiting. Past treatments include anti-itch cream and topical steroids (nystatin cream). The treatment provided no relief.   Relevant past medical, surgical, family, and social history reviewed and updated as indicated.  Allergies and medications reviewed and updated. Data reviewed: Chart in Epic.   Past  Medical History:  Diagnosis Date   Acid reflux 2010   Acne vulgaris 2017   ADHD (attention deficit hyperactivity disorder) 2011   Adverse food reaction 07/11/2016   Shellfish allergy   Amenorrhea 2019   Anaphylactic shock due to adverse food reaction 01/15/2018   Anxiety 2017   ASD (atrial septal defect) 11/22/2022   Chronic constipation 09/05/2011   Chronic tension headaches 2011   originally followed by [facility] K.Griffin. Then Dr Consuello [place] [facility] 03/2020 (see above)    Coalition, calcaneus navicular    Dysmenorrhea    Eczema    Eustachian tube dysfunction, left 10/23/2019   Frequent headaches    Galactorrhea    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 04/01/2019   Hiatal hernia    History of atrial septal defect repair 12/04/2011   Hyperlipidemia 2013   Hypertension 2011   Hypertension 09/28/2020   hypertension, cellulitus    Idiopathic urticaria 09/05/2011   Insomnia    Keratosis pilaris 01/15/2018   Mass of left thigh 11/22/2017   Overview:  Added automatically from request for surgery [id number]   MDD (major depressive disorder), severe ([place]) 03/06/2018   Migraine 2011   re-diagnosed as chronic migraines 03/2020 [facility] - starting on preventative    Obesity without serious comorbidity with body mass index (BMI) in 95th to 98th percentile for age in pediatric patient 09/04/2018   Piriformis syndrome    POTS (postural orthostatic tachycardia syndrome)    PTSD (post-traumatic stress disorder)    Scoliosis 2019   Seasonal and perennial allergic rhinitis 2010   Severe persistent asthma without complication [id number]   Sleep paralysis  Suicidal ideation 03/07/2018   Tic disorder 2019    Past Surgical History:  Procedure Laterality Date   ASD REPAIR  2012   with Helix, via Cardiac Catheterization   atrial septal defecgt     Hendrix   lipoma removal  2019   LUMBAR PUNCTURE  05/20/2021   TONSILLECTOMY AND ADENOIDECTOMY  2010    Social  History   Socioeconomic History   Marital status: Single    Spouse name: Not on file   Number of children: Not on file   Years of education: Not on file   Highest education level: Not on file  Occupational History   Not on file  Tobacco Use   Smoking status: Never   Smokeless tobacco: Never  Vaping Use   Vaping Use: Never used  Substance and Sexual Activity   Alcohol use: No    Alcohol/week: 0.0 standard drinks of alcohol   Drug use: No   Sexual activity: Yes  Other Topics Concern   Not on file  Social History Narrative   Not on file   Social Determinants of Health   Financial Resource Strain: Low Risk  (11/22/2022)   Overall Financial Resource Strain (CARDIA)    Difficulty of Paying Living Expenses: Not very hard  Food Insecurity: No Food Insecurity (11/22/2022)   Hunger Vital Sign    Worried About Running Out of Food in the Last Year: Never true    Ran Out of Food in the Last Year: Never true  Transportation Needs: No Transportation Needs (11/22/2022)   PRAPARE - Hydrologist (Medical): No    Lack of Transportation (Non-Medical): No  Physical Activity: Not on file  Stress: Not on file  Social Connections: Not on file  Intimate Partner Violence: Not on file    Outpatient Encounter Medications as of 01/12/2023  Medication Sig   albuterol (PROVENTIL) (2.5 MG/3ML) 0.083% nebulizer solution Take 3 mLs (2.5 mg total) by nebulization every 6 (six) hours as needed for wheezing or shortness of breath.   Aluminum Chloride Hexahydrate POWD 1 Application by Does not apply route in the morning and at bedtime.   ascorbic acid (VITAMIN C) 500 MG tablet Take 500 mg by mouth daily.   atomoxetine (STRATTERA) 60 MG capsule Take 1 capsule (60 mg total) by mouth daily.   Azelastine-Fluticasone 137-50 MCG/ACT SUSP Place 1 spray into both nostrils 2 (two) times daily.   bisoprolol (ZEBETA) 10 MG tablet Take 1 tablet (10 mg total) by mouth daily.    budesonide-formoterol (SYMBICORT) 160-4.5 MCG/ACT inhaler Inhale 2 puffs into the lungs in the morning and at bedtime. (Patient taking differently: Inhale 1 puff into the lungs every morning.)   busPIRone (BUSPAR) 10 MG tablet Take 1 tablet (10 mg total) by mouth 2 (two) times daily.   calcium carbonate (OS-CAL) 1250 (500 Ca) MG chewable tablet Chew by mouth.   cariprazine (VRAYLAR) 1.5 MG capsule Take 1 capsule (1.5 mg total) by mouth daily.   Cholecalciferol 25 MCG (1000 UT) tablet Take 1 Units by mouth daily at 2 PM.   clobetasol (TEMOVATE) 0.05 % external solution APPLY TO AFFECTED AREA TWICE A DAY   clonazePAM (KLONOPIN) 0.5 MG tablet Take 1 tablet (0.5 mg total) by mouth 2 (two) times daily as needed for anxiety.   EPINEPHrine 0.3 mg/0.3 mL IJ SOAJ injection Inject 0.3 mg into the muscle as needed for anaphylaxis.   fexofenadine (ALLEGRA) 180  MG tablet Take 1 tablet (180 mg total) by mouth daily.   Galcanezumab-gnlm 120 MG/ML SOAJ Inject into the skin.   hydrOXYzine (VISTARIL) 25 MG capsule Take 1-2 capsules (25-50 mg total) by mouth daily as needed.   hyoscyamine (LEVSIN SL) 0.125 MG SL tablet Take by mouth as needed.   LINZESS 72 MCG capsule Take 72 mcg by mouth daily.   methocarbamol (ROBAXIN) 500 MG tablet Take 1 tablet (500 mg total) by mouth every 8 (eight) hours as needed for muscle spasms.   metoprolol tartrate (LOPRESSOR) 25 MG tablet Take 1 tablet (25 mg total) by mouth as needed.   montelukast (SINGULAIR) 10 MG tablet Take 1 tablet (10 mg total) by mouth daily.   Multiple Vitamin (MULTIVITAMIN) capsule Take by mouth.   mupirocin cream (BACTROBAN) 2 % Apply 1 Application topically 2 (two) times daily.   norethindrone (AYGESTIN) 5 MG tablet Take 5 mg by mouth daily.   nystatin (MYCOSTATIN/NYSTOP) powder Apply 1 Application topically 3 (three) times daily. (Patient taking differently: Apply 1 Application topically as needed.)   nystatin cream (MYCOSTATIN) APPLY TO AFFECTED AREA  TWICE A DAY (Patient taking differently: as needed.)   Olopatadine HCl 0.2 % SOLN Apply 1 drop to eye every morning.   ondansetron (ZOFRAN-ODT) 4 MG disintegrating tablet DISSOLVE ON TONGUE AT ONSET OF NAUSEA- 30 DAY SUPPLY   pantoprazole (PROTONIX) 40 MG tablet Take 1 tablet by mouth in the morning and at bedtime.   RESTASIS 0.05 % ophthalmic emulsion 1 drop 2 (two) times daily.   sertraline (ZOLOFT) 50 MG tablet Take 1.5 tablets (75 mg total) by mouth daily.   SUMAtriptan (IMITREX) 100 MG tablet Take by mouth.   VENTOLIN HFA 108 (90 Base) MCG/ACT inhaler INHALE 2 PUFFS INTO THE LUNGS EVERY 4 HOURS AS NEEDED FOR WHEEZING OR SHORTNESS OF BREATH.   No facility-administered encounter medications on file as of 01/12/2023.    Allergies  Allergen Reactions   Glucosamine Forte [Nutritional Supplements] Anaphylaxis    Due to containing shellfish   Nutritional Supplements Anaphylaxis   Oyster Extract Anaphylaxis   Shellfish-Derived Products Anaphylaxis    Throat swelling   Cats Claw (Uncaria Tomentosa) Nausea And Vomiting   Diamox [Acetazolamide] Other (See Comments)    Numbness- feet, hands, lips   Lactose    Lactose Intolerance (Gi)    Topiramate Other (See Comments)    Numbness to face, hands and feet   Bromfed Dm [Pseudoeph-Bromphen-Dm] Anxiety    CARBOFED DM    Review of Systems  Constitutional:  Negative for activity change, appetite change, chills, fatigue, fever and unexpected weight change.  HENT: Negative.  Negative for congestion, rhinorrhea and sore throat.   Eyes: Negative.  Negative for pain and visual disturbance.  Respiratory:  Negative for cough, chest tightness and shortness of breath.   Cardiovascular:  Negative for chest pain, palpitations and leg swelling.  Gastrointestinal:  Negative for abdominal pain, anorexia, blood in stool, constipation, diarrhea, nausea and vomiting.  Endocrine: Negative.   Genitourinary:  Negative for decreased urine volume, difficulty  urinating, dysuria, frequency and urgency.  Musculoskeletal:  Negative for arthralgias, joint pain and myalgias.  Skin:  Positive for color change and rash. Negative for nail changes, pallor and wound.       Excessive sweating  Allergic/Immunologic: Negative.   Neurological:  Negative for dizziness and headaches.  Hematological: Negative.   Psychiatric/Behavioral:  Negative for confusion, hallucinations, sleep disturbance and suicidal ideas.   All other systems reviewed and are negative.  Objective:  BP 136/79   Pulse 99   Temp (!) 97 F (36.1 C) (Temporal)   Ht 4' 11"$  (1.499 m)   Wt 250 lb 12.8 oz (113.8 kg)   LMP  (LMP Unknown) Comment: last cycle in 2020  SpO2 100%   BMI 50.66 kg/m    Wt Readings from Last 3 Encounters:  01/12/23 250 lb 12.8 oz (113.8 kg) (>99 %, Z= 2.51)*  12/29/22 249 lb 14.4 oz (113.4 kg) (>99 %, Z= 2.50)*  11/22/22 248 lb 12.8 oz (112.9 kg) (>99 %, Z= 2.48)*   * Growth percentiles are based on CDC (Girls, 2-20 Years) data.    Physical Exam Vitals and nursing note reviewed.  Constitutional:      General: She is not in acute distress.    Appearance: Normal appearance. She is well-developed and well-groomed. She is morbidly obese. She is not ill-appearing, toxic-appearing or diaphoretic.  HENT:     Head: Normocephalic and atraumatic.     Jaw: There is normal jaw occlusion.     Right Ear: Hearing normal.     Left Ear: Hearing normal.     Nose: Nose normal.     Mouth/Throat:     Lips: Pink.     Mouth: Mucous membranes are moist.     Pharynx: Uvula midline.  Eyes:     General: Lids are normal.     Pupils: Pupils are equal, round, and reactive to light.  Neck:     Thyroid: No thyroid mass, thyromegaly or thyroid tenderness.     Vascular: No carotid bruit or JVD.     Trachea: Trachea and phonation normal.  Cardiovascular:     Rate and Rhythm: Normal rate and regular rhythm.     Chest Wall: PMI is not displaced.     Pulses: Normal pulses.      Heart sounds: Normal heart sounds. No murmur heard.    No friction rub. No gallop.  Pulmonary:     Effort: Pulmonary effort is normal. No respiratory distress.     Breath sounds: Normal breath sounds. No wheezing.  Abdominal:     General: There is no abdominal bruit.     Palpations: There is no hepatomegaly or splenomegaly.  Musculoskeletal:     Cervical back: Normal range of motion and neck supple.     Right lower leg: No edema.     Left lower leg: No edema.  Lymphadenopathy:     Cervical: No cervical adenopathy.  Skin:    General: Skin is warm and dry.     Capillary Refill: Capillary refill takes less than 2 seconds.     Coloration: Skin is not cyanotic, jaundiced or pale.     Findings: Erythema and rash present.       Neurological:     General: No focal deficit present.     Mental Status: She is alert and oriented to person, place, and time.     Sensory: Sensation is intact.     Motor: Motor function is intact.     Coordination: Coordination is intact.     Gait: Gait is intact.     Deep Tendon Reflexes: Reflexes are normal and symmetric.  Psychiatric:        Attention and Perception: Attention and perception normal.        Mood and Affect: Mood and affect normal.        Speech: Speech normal.        Behavior: Behavior normal. Behavior is cooperative.  Thought Content: Thought content normal.        Cognition and Memory: Cognition and memory normal.        Judgment: Judgment normal.     Results for orders placed or performed in visit on 12/29/22  ECHOCARDIOGRAM COMPLETE  Result Value Ref Range   Weight 3,998.4 oz   Height 59 in   BP 116/72 mmHg   S' Lateral 1.98 cm   Est EF 55 - 60%        Pertinent labs & imaging results that were available during my care of the patient were reviewed by me and considered in my medical decision making.  Assessment & Plan:  Molly [name] was seen today for follow up abcess and rash.  Diagnoses and all orders for this  visit:  Pruritic rash Erythematous rash Will treat with below for the next 7 days. 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 Wound care discussed in detail. Report new, worsening, or persistent symptoms.  -     mupirocin cream (BACTROBAN) 2 %; Apply 1 Application topically 2 (two) times daily.  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  -     Aluminum Chloride Hexahydrate POWD; 1 Application by Does not apply route in the morning and at bedtime.  Continue all other maintenance medications.  Follow up plan: Return in about 3 months (around 04/12/2023), or if symptoms worsen or fail to improve, for CPE.   Continue healthy lifestyle choices, including diet (rich in fruits, vegetables, and lean proteins, and low in salt and simple carbohydrates) and exercise (at least 30 minutes of moderate physical activity daily).  Educational handout given for hyperhidrosis  The above assessment and management plan was discussed with the patient. The patient verbalized understanding of and has agreed to the management plan. Patient is aware to call the clinic if they develop any new symptoms or if symptoms persist or worsen. Patient is aware when to return to the clinic for a follow-up visit. Patient educated on when it is appropriate to go to the emergency department.   Monia Pouch, FNP-C [facility] [phone]

## 2023-01-12 NOTE — Telephone Encounter (Signed)
Aluminum Chloride Anhydrous POWD        Changed from: Aluminum Chloride Hexahydrate POWD    Pharmacy comment: Script Clarification:COULD THIS BE FOR Molly [name]

## 2023-01-13 ENCOUNTER — Other Ambulatory Visit: Payer: Self-pay | Admitting: Family Medicine

## 2023-01-13 DIAGNOSIS — R61 Generalized hyperhidrosis: Secondary | ICD-10-CM

## 2023-01-14 NOTE — Telephone Encounter (Signed)
Tell her it looks liek the most common sideeffects are flushing, constipation and dry mouth Thanks SK

## 2023-01-15 ENCOUNTER — Encounter: Payer: Self-pay | Admitting: Family Medicine

## 2023-01-15 NOTE — Telephone Encounter (Signed)
Aluminum Chloride CRYS        Changed from: Aluminum Chloride Anhydrous POWD    Pharmacy comment: Alternative Requested:VERIFY DRUG PLEASE   Also note on 01/12/23 from pharmacy

## 2023-01-16 ENCOUNTER — Encounter: Payer: Self-pay | Admitting: Family Medicine

## 2023-01-16 NOTE — Telephone Encounter (Signed)
Drysol

## 2023-01-17 ENCOUNTER — Other Ambulatory Visit: Payer: Self-pay | Admitting: Family Medicine

## 2023-01-17 ENCOUNTER — Telehealth: Payer: Self-pay

## 2023-01-17 DIAGNOSIS — R61 Generalized hyperhidrosis: Secondary | ICD-10-CM

## 2023-01-17 MED ORDER — MUPIROCIN 2 % EX OINT: 1.0000 | TOPICAL_OINTMENT | Freq: Two times a day (BID) | CUTANEOUS | 0 refills | Status: DC

## 2023-01-17 NOTE — Telephone Encounter (Signed)
Patient's Mupirocin cream is a non-preferred drug on her Medicaid plan.  Mupirocin ointment or Gentamicin cream or ointment are covered drugs under her plan.  Can you please resubmit one of these as a prescription.

## 2023-01-17 NOTE — Addendum Note (Signed)
Addended by: Baruch Gouty on: 01/17/2023 01:27 PM   Modules accepted: Orders

## 2023-01-17 NOTE — Telephone Encounter (Signed)
Patient aware.

## 2023-01-21 IMAGING — MR MR LUMBAR SPINE W/O CM
5 series · 32 of 48 positions shown · non-contrast
Comparison: None.

CLINICAL DATA: Chronic lower back pain involving the buttocks and
both legs

EXAM:
MRI LUMBAR SPINE WITHOUT CONTRAST
TECHNIQUE: Multiplanar, multisequence MR imaging of the lumbar spine was
performed. No intravenous contrast was administered.

[Series 5: T2 · sagittal · 4.0mm · 0.68mm/px · 6 of 13 slices shown (1 of 2)]
[im 1/13]
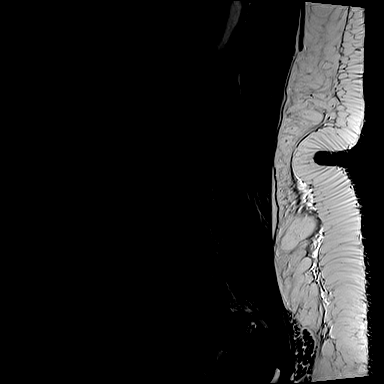
[im 3/13]
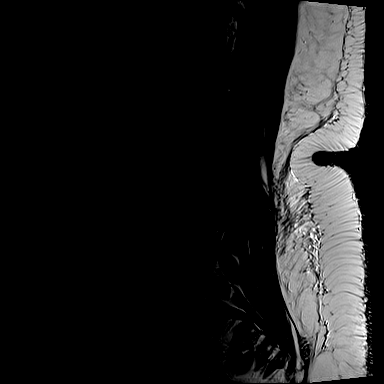
[im 5/13]
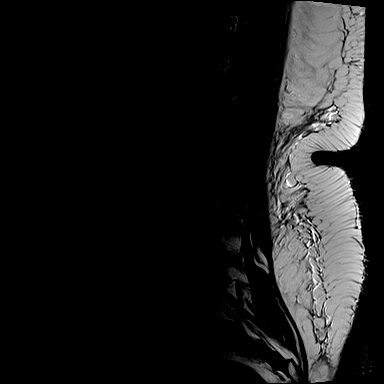
[im 8/13]
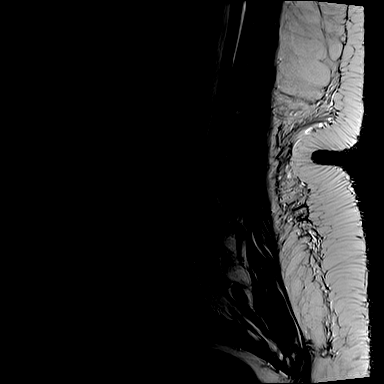
[im 10/13]
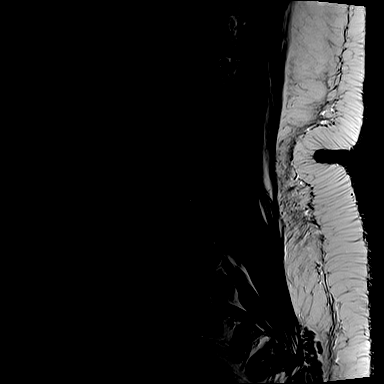
[im 13/13]
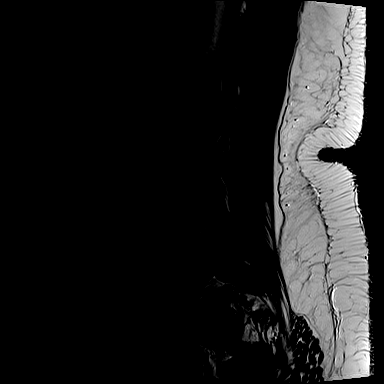

[Series 6: T1 · sagittal · 4.0mm · 0.81mm/px · 6 of 13 slices shown (1 of 2)]
[im 1/13]
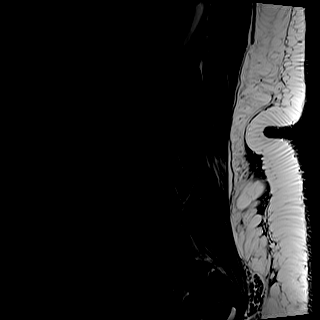
[im 3/13]
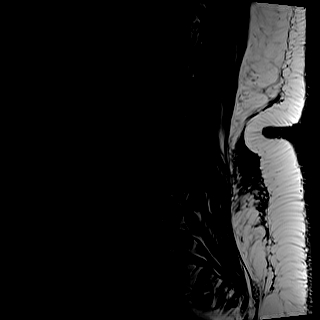
[im 5/13]
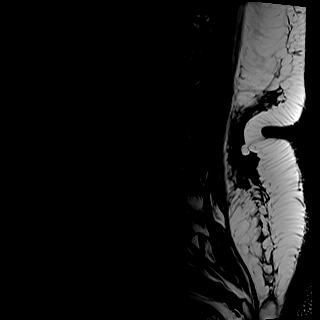
[im 8/13]
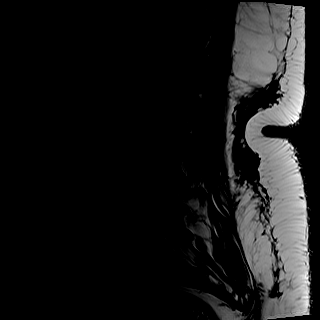
[im 10/13]
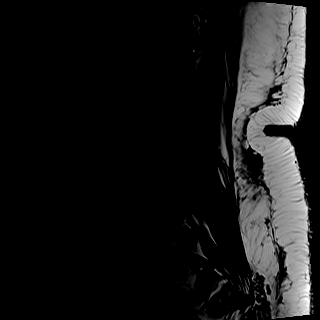
[im 13/13]
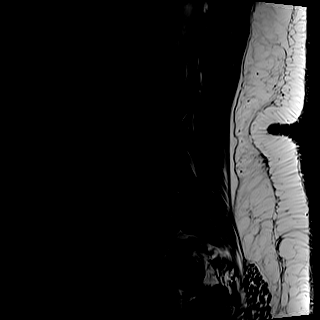

[Series 7: STIR · sagittal · 4.0mm · 0.51mm/px · 2 of 13 slices shown]
[im 1/13]
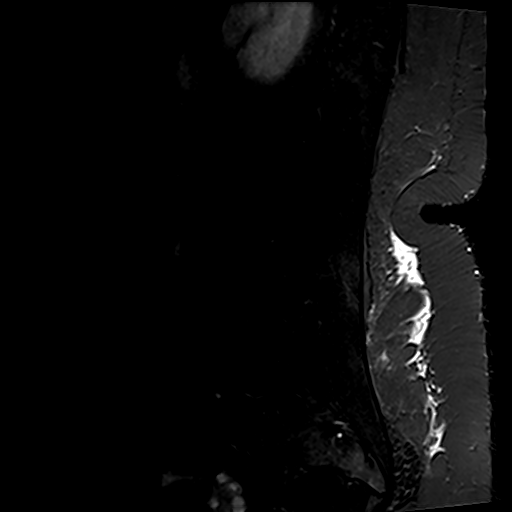
[im 3/13]
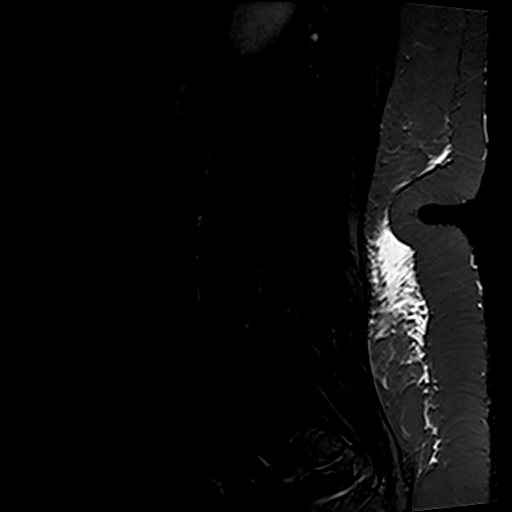

[Series 8: T2 · axial · 4.0mm · 0.70mm/px · z∈[-75,+108]mm · 9 of 31 slices shown (2 of 2)]
[im 1/31]
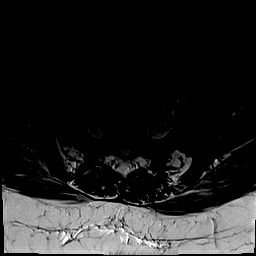
[im 5/31]
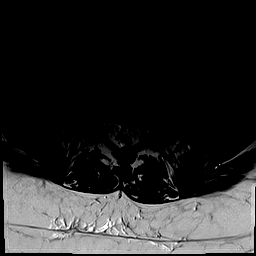
[im 9/31]
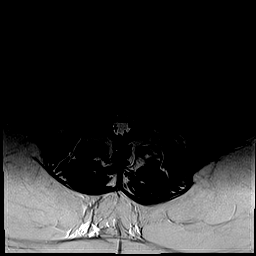
[im 13/31]
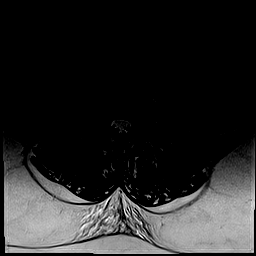
[im 16/31]
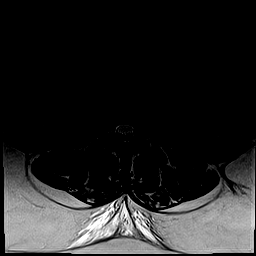
[im 18/31]
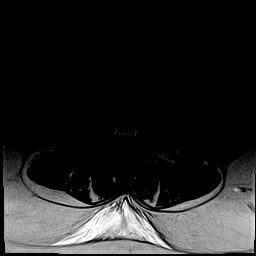
[im 22/31]
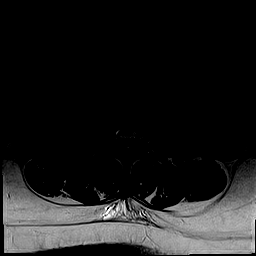
[im 26/31]
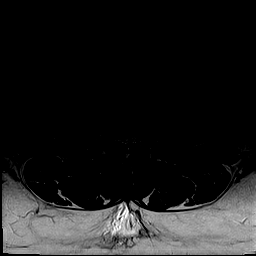
[im 31/31]
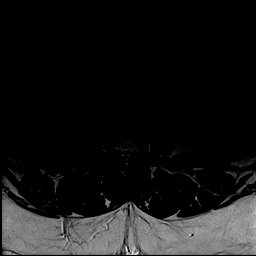

[Series 9: T1 · axial · 4.0mm · 0.35mm/px · z∈[-75,+108]mm · 9 of 31 slices shown (2 of 2)]
[im 1/31]
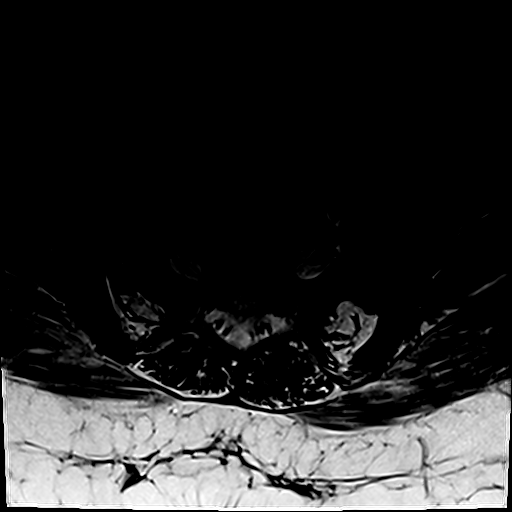
[im 5/31]
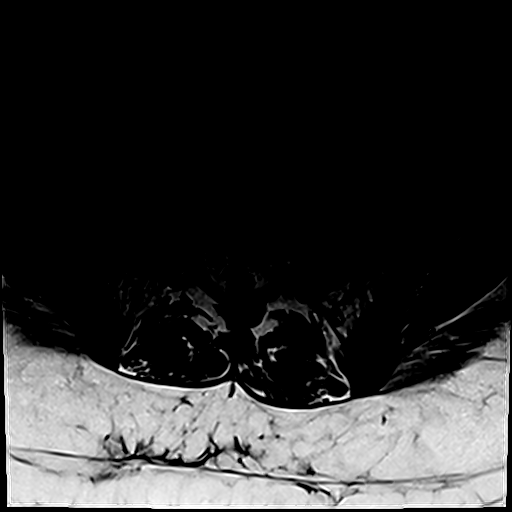
[im 9/31]
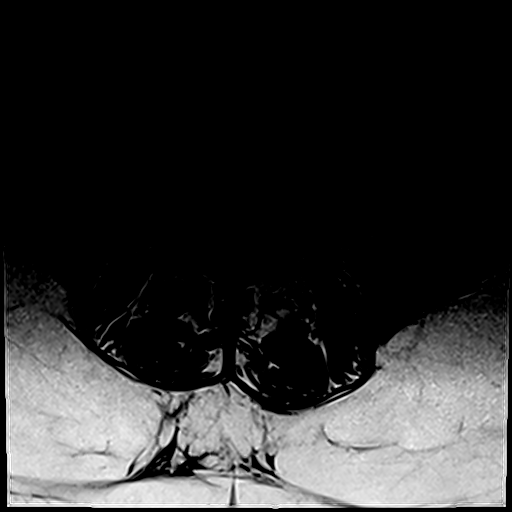
[im 13/31]
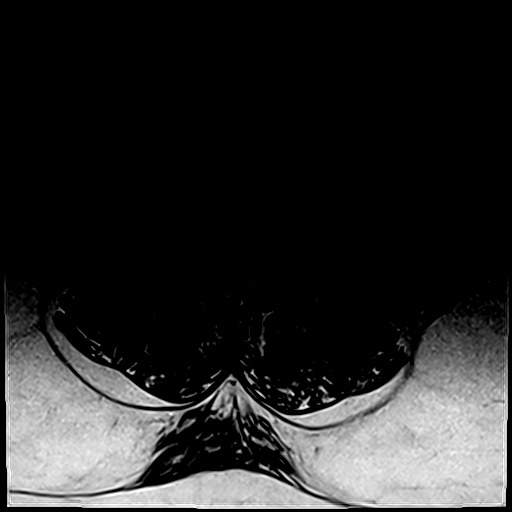
[im 16/31]
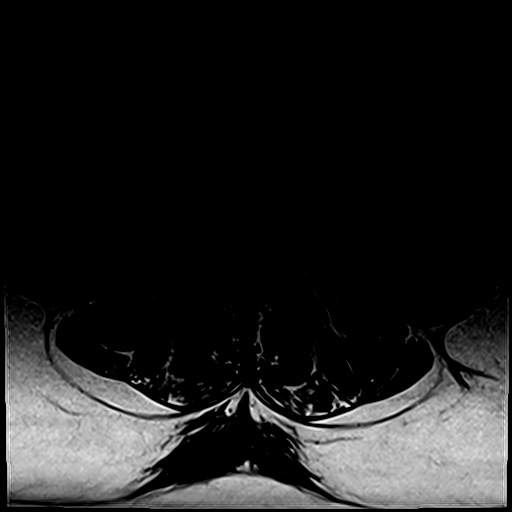
[im 18/31]
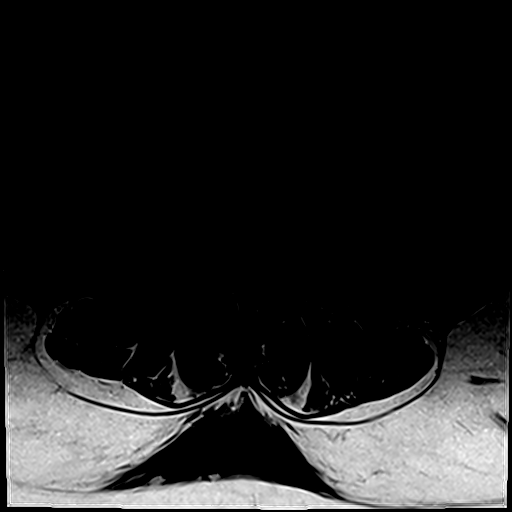
[im 22/31]
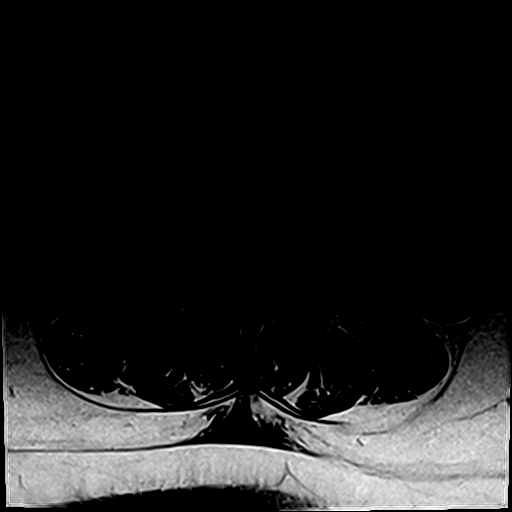
[im 26/31]
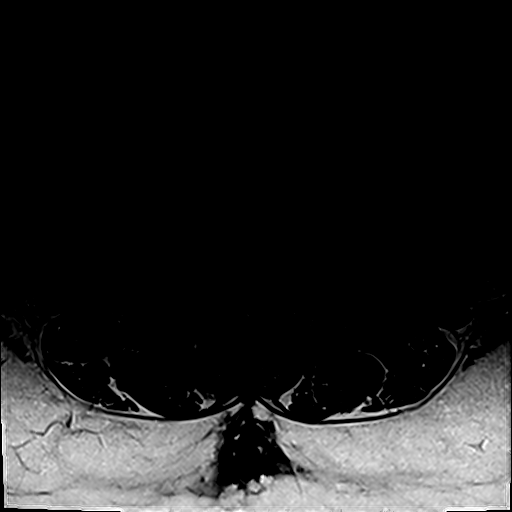
[im 31/31]
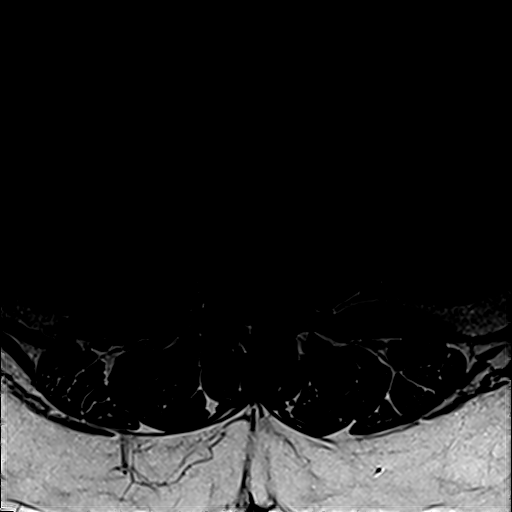

[32 of 48 positions shown; findings below may reference images not displayed]

FINDINGS: Segmentation:  Unremarkable based on the available coverage.

Alignment:  Physiologic.

Vertebrae:  No fracture, evidence of discitis, or bone lesion.

Conus medullaris and cauda equina: Conus extends to the L1 level.
Conus and cauda equina appear normal.

Paraspinal and other soft tissues: Negative for perispinal mass or
inflammation

Disc levels:

Diffusely preserved disc height and hydration. Negative facets. No
neural impingement.

Conjoined root sleeves on the left at L5-S1.
IMPRESSION: Negative lumbar MRI.

## 2023-01-22 ENCOUNTER — Encounter: Payer: Self-pay | Admitting: Family Medicine

## 2023-01-22 ENCOUNTER — Other Ambulatory Visit: Payer: Self-pay | Admitting: Family Medicine

## 2023-01-22 DIAGNOSIS — R61 Generalized hyperhidrosis: Secondary | ICD-10-CM

## 2023-01-23 ENCOUNTER — Telehealth: Payer: Self-pay | Admitting: Family Medicine

## 2023-01-23 NOTE — Telephone Encounter (Signed)
Pharmacy called regarding Aluminum Chloride CRYS Rx. Says insurance does not cover it nor can they get it. Needs to speak with nurse or PCP regarding this Rx.

## 2023-01-23 NOTE — Telephone Encounter (Signed)
Per Sharyn Lull rx has been canceled and pharmacy aware.  Patient is getting otc.

## 2023-02-01 ENCOUNTER — Ambulatory Visit (INDEPENDENT_AMBULATORY_CARE_PROVIDER_SITE_OTHER): Payer: Medicaid Other | Admitting: Pulmonary Disease

## 2023-02-01 ENCOUNTER — Encounter (HOSPITAL_BASED_OUTPATIENT_CLINIC_OR_DEPARTMENT_OTHER): Payer: Self-pay | Admitting: Pulmonary Disease

## 2023-02-01 VITALS — BP 126/78 | HR 93 | Temp 99.2°F | Ht 59.0 in | Wt 246.6 lb

## 2023-02-01 DIAGNOSIS — G4733 Obstructive sleep apnea (adult) (pediatric): Secondary | ICD-10-CM | POA: Diagnosis not present

## 2023-02-01 DIAGNOSIS — R0683 Snoring: Secondary | ICD-10-CM | POA: Diagnosis not present

## 2023-02-01 DIAGNOSIS — J4550 Severe persistent asthma, uncomplicated: Secondary | ICD-10-CM

## 2023-02-01 NOTE — Assessment & Plan Note (Signed)
Appears controlled on Symbicort and albuterol for rescue. Being managed by Dr. Ernst Bowler

## 2023-02-01 NOTE — Progress Notes (Signed)
Subjective:    Patient ID: Molly [name], female    DOB: 2001/01/10, 22 y.o.   MRN: [id number]  HPI  Chief Complaint  Patient presents with   sleep consult    Pt has been told that she snores at night. States when she wakes up in the morning, she feels like she has not slept at all.    22 year old [place] presents for evaluation of sleep disordered breathing. She reports nonrefreshing sleep and excessive daytime somnolence and fatigue.  She is accompanied by her mom who corroborates history. Epworth sleepiness score is 10.  She does not drive. 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 Mom has noted that she talks in her sleep. Bedtime is around midnight, sleep latency can be hours, she sleeps 7 supine or on her side and often rolls over to sleep on her stomach with 2 pillows, reports nocturnal multiple awakenings and is out of bed as late as noon feeling tired with occasional headache and lightheadedness. There is no history suggestive of cataplexy or parasomnias  She was advised sleep study but was afraid to get this during the [place] pandemic. She reports choking and gasping episodes that wake her up from sleep She has a history of tonsillectomy adenoidectomy at age 58 but her snoring has persisted per mom. She has gained "a lot" of weight over the last 2 years  PMH-  Migraine headaches,?  Pseudotumor cerebri PTSD, ADHD, anxiety and depression Possible POTS ASD repair Hypertension Mild persistent asthma -Dr. Ernst Bowler  Significant tests/ events reviewed  Spirometry 07/2022 normal, ratio 82, FEV1 92%, FVC 100%  Past Medical History:  Diagnosis Date   Acid reflux 2010   Acne vulgaris 2017   ADHD (attention deficit hyperactivity disorder) 2011   Adverse food reaction 07/11/2016   Shellfish allergy   Amenorrhea 2019   Anaphylactic shock due to adverse food reaction 01/15/2018   Anxiety 2017   ASD (atrial septal defect) 11/22/2022    Chronic constipation 09/05/2011   Chronic tension headaches 2011   originally followed by [facility] K.Griffin. Then Dr Consuello Bossier [facility] 03/2020 (see above)    Coalition, calcaneus navicular    Dysmenorrhea    Eczema    Eustachian tube dysfunction, left 10/23/2019   Frequent headaches    Galactorrhea    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 04/01/2019   Hiatal hernia    History of atrial septal defect repair 12/04/2011   Hyperlipidemia 2013   Hypertension 2011   Hypertension 09/28/2020   hypertension, cellulitus    Idiopathic urticaria 09/05/2011   Insomnia    Keratosis pilaris 01/15/2018   Mass of left thigh 11/22/2017   Overview:  Added automatically from request for surgery [id number]   MDD (major depressive disorder), severe ([place]) 03/06/2018   Migraine 2011   re-diagnosed as chronic migraines 03/2020 [facility] - starting on preventative    Obesity without serious comorbidity with body mass index (BMI) in 95th to 98th percentile for age in pediatric patient 09/04/2018   Piriformis syndrome    POTS (postural orthostatic tachycardia syndrome)    PTSD (post-traumatic stress disorder)    Scoliosis 2019   Seasonal and perennial allergic rhinitis 2010   Severe persistent asthma without complication [id number]   Sleep paralysis    Suicidal ideation 03/07/2018   Tic disorder 2019   Past Surgical History:  Procedure Laterality Date   ASD REPAIR  2012   with Helix, via Cardiac Catheterization   atrial  septal defecgt     CARDIAC SURGERY     DENTAL SURGERY  2020   lipoma removal  2019   LUMBAR PUNCTURE  05/20/2021   TONSILLECTOMY AND ADENOIDECTOMY  2010    Allergies  Allergen Reactions   Glucosamine Forte [Nutritional Supplements] Anaphylaxis    Due to containing shellfish   Nutritional Supplements Anaphylaxis   Oyster Extract Anaphylaxis   Shellfish-Derived Products Anaphylaxis    Throat swelling   Cats Claw (Uncaria Tomentosa) Nausea And Vomiting   Diamox [Acetazolamide] Other (See  Comments)    Numbness- feet, hands, lips   Lactose    Lactose Intolerance (Gi)    Topiramate Other (See Comments)    Numbness to face, hands and feet   Bromfed Dm [Pseudoeph-Bromphen-Dm] Anxiety    CARBOFED DM    Social History   Socioeconomic History   Marital status: Single    Spouse name: Not on file   Number of children: Not on file   Years of education: Not on file   Highest education level: Not on file  Occupational History   Not on file  Tobacco Use   Smoking status: Never   Smokeless tobacco: Never  Vaping Use   Vaping Use: Never used  Substance and Sexual Activity   Alcohol use: No    Alcohol/week: 0.0 standard drinks of alcohol   Drug use: No   Sexual activity: Yes  Other Topics Concern   Not on file  Social History Narrative   Not on file   Social Determinants of Health   Financial Resource Strain: Low Risk  (11/22/2022)   Overall Financial Resource Strain (CARDIA)    Difficulty of Paying Living Expenses: Not very hard  Food Insecurity: No Food Insecurity (11/22/2022)   Hunger Vital Sign    Worried About Running Out of Food in the Last Year: Never true    Ran Out of Food in the Last Year: Never true  Transportation Needs: No Transportation Needs (11/22/2022)   PRAPARE - Hydrologist (Medical): No    Lack of Transportation (Non-Medical): No  Physical Activity: Not on file  Stress: Not on file  Social Connections: Not on file  Intimate Partner Violence: Not on file    Family History  Problem Relation Age of Onset   Hypertension Mother    Hyperlipidemia Mother    Diabetes Mother    Depression Mother    Arthritis Mother    Allergic rhinitis Mother    Asthma Mother    Bipolar disorder Mother    Anxiety disorder Mother    Mental illness Mother    Miscarriages / [place] Mother    Thyroid disease Mother    Bipolar disorder Father    Drug abuse Father    Alcohol abuse Father    ADD / ADHD Father    Mental  illness Father    Allergic rhinitis Brother    Asthma Brother    ADD / ADHD Brother    Learning disabilities Brother    Hypertension Maternal Grandmother    Hyperlipidemia Maternal Grandmother    Heart disease Maternal Grandmother    Heart attack Maternal Grandfather    Bipolar disorder Maternal Grandfather    Angioedema Neg Hx    Eczema Neg Hx    Immunodeficiency Neg Hx    Urticaria Neg Hx       Review of Systems Shortness of breath with activity Heart racing, palpitations Chest pain Acid heartburn indigestion Loss of appetite Sore  throat Headaches and nasal congestion with sneezing Itching earache Anxiety and depression Feet swelling Joint stiffness    Objective:   Physical Exam  Gen. Pleasant, obese, in no distress, normal affect ENT - no pallor,icterus, no post nasal drip, class 2-3 airway Neck: No JVD, no thyromegaly, no carotid bruits Lungs: no use of accessory muscles, no dullness to percussion, decreased without rales or rhonchi  Cardiovascular: Rhythm regular, heart sounds  normal, no murmurs or gallops, no peripheral edema Abdomen: soft and non-tender, no hepatosplenomegaly, BS normal. Musculoskeletal: No deformities, no cyanosis or clubbing Neuro:  alert, non focal, no tremors       Assessment & Plan:   Restless leg syndrome -iron studies 04/2022 shows normal ferritin. Will hold off on medications

## 2023-02-01 NOTE — Assessment & Plan Note (Signed)
Given excessive daytime somnolence, narrow pharyngeal exam, witnessed apneas & loud snoring, obstructive sleep apnea is very likely & an overnight polysomnogram will be scheduled as a home  study. The pathophysiology of obstructive sleep apnea , it's cardiovascular consequences & modes of treatment including CPAP were discused with the patient in detail & they evidenced understanding. Pretest probably is high.  Will proceed with a home sleep test.  If there is significant sleep disordered breathing we will treat that with PAP therapy she would be willing to trial.  If there is no significant OSA, then she will need overnight and PSG followed by MSLT to rule out narcolepsy

## 2023-02-01 NOTE — Patient Instructions (Addendum)
X home sleep test  We discussed possible OSA & narcolepsy

## 2023-02-02 ENCOUNTER — Telehealth: Payer: Self-pay | Admitting: Pulmonary Disease

## 2023-02-02 DIAGNOSIS — R0683 Snoring: Secondary | ICD-10-CM

## 2023-02-02 DIAGNOSIS — G4733 Obstructive sleep apnea (adult) (pediatric): Secondary | ICD-10-CM

## 2023-02-02 NOTE — Telephone Encounter (Signed)
Pt can not have HST with [employer] - can only be in lab sleep study

## 2023-02-02 NOTE — Addendum Note (Signed)
Addended by: Gerald Stabs on: 02/02/2023 10:15 AM   Modules accepted: Orders

## 2023-02-02 NOTE — Telephone Encounter (Signed)
Split night study has been ordered. HST has been cancelled.

## 2023-02-05 ENCOUNTER — Encounter: Payer: Self-pay | Admitting: Family Medicine

## 2023-02-07 ENCOUNTER — Encounter: Payer: Self-pay | Admitting: Family Medicine

## 2023-02-07 ENCOUNTER — Ambulatory Visit (INDEPENDENT_AMBULATORY_CARE_PROVIDER_SITE_OTHER): Payer: Medicaid Other | Admitting: Family Medicine

## 2023-02-07 VITALS — BP 123/81 | HR 101 | Temp 97.6°F | Ht 59.0 in | Wt 251.6 lb

## 2023-02-07 DIAGNOSIS — R300 Dysuria: Secondary | ICD-10-CM | POA: Diagnosis not present

## 2023-02-07 DIAGNOSIS — N3001 Acute cystitis with hematuria: Secondary | ICD-10-CM | POA: Diagnosis not present

## 2023-02-07 DIAGNOSIS — J301 Allergic rhinitis due to pollen: Secondary | ICD-10-CM

## 2023-02-07 LAB — URINALYSIS, ROUTINE W REFLEX MICROSCOPIC
Bilirubin, UA: NEGATIVE
Glucose, UA: NEGATIVE
Ketones, UA: NEGATIVE
Nitrite, UA: NEGATIVE
Protein,UA: NEGATIVE
Specific Gravity, UA: 1.015 (ref 1.005–1.030)
Urobilinogen, Ur: 0.2 mg/dL (ref 0.2–1.0)
pH, UA: 6.5 (ref 5.0–7.5)

## 2023-02-07 LAB — WET PREP FOR TRICH, YEAST, CLUE
Clue Cell Exam: NEGATIVE
Trichomonas Exam: NEGATIVE
Yeast Exam: NEGATIVE

## 2023-02-07 LAB — MICROSCOPIC EXAMINATION
RBC, Urine: NONE SEEN /hpf (ref 0–2)
Renal Epithel, UA: NONE SEEN /hpf

## 2023-02-07 MED ORDER — FEXOFENADINE HCL 180 MG PO TABS: 180.0000 mg | ORAL_TABLET | Freq: Every day | ORAL | 5 refills | Status: DC

## 2023-02-07 MED ORDER — NITROFURANTOIN MONOHYD MACRO 100 MG PO CAPS: 100.0000 mg | ORAL_CAPSULE | Freq: Two times a day (BID) | ORAL | 0 refills | Status: AC

## 2023-02-07 NOTE — Progress Notes (Signed)
Subjective:  Patient ID: Molly [name], female    DOB: [birth year], 22 y.o.   MRN: [id number]  Patient Care Team: Baruch Gouty, FNP as PCP - General (Family Medicine) Ernst Bowler Gwenith Daily, MD as Consulting Physician (Allergy and Immunology) Cloria Spring, MD as Consulting Physician [facility]) Timoteo Gaul, MD as Consulting Physician (Neurology) Lillia Mountain, Cindee Lame, MD as Consulting Physician (Pediatric Gastroenterology)   Chief Complaint:  Dysuria (X 1 week ) and Vaginal Itching (X 1 1/2 weeks )   HPI: Molly [name] is a 22 y.o. female presenting on 02/07/2023 for Dysuria (X 1 week ) and Vaginal Itching (X 1 1/2 weeks )   Pt presents today with complaints of dysuria and vaginal itching. States this started about 7-10 days ago and seems to be worsening. Denies vaginal discharge. No vaginal bleeding.   Dysuria  This is a new problem. Episode onset: 7-10 days ago. The problem occurs every urination. The problem has been waxing and waning. The quality of the pain is described as burning. The pain is moderate. There has been no fever. She is Not sexually active. There is No history of pyelonephritis. Associated symptoms include urgency. Pertinent negatives include no chills, discharge, flank pain, frequency, hematuria, hesitancy, nausea, possible pregnancy, sweats or vomiting. She has tried nothing for the symptoms.   Needs refill on Allegra today.    Relevant past medical, surgical, family, and social history reviewed and updated as indicated.  Allergies and medications reviewed and updated. Data reviewed: Chart in Epic.   Past Medical History:  Diagnosis Date   Acid reflux 2010   Acne vulgaris 2017   ADHD (attention deficit hyperactivity disorder) 2011   Adverse food reaction 07/11/2016   Shellfish allergy   Amenorrhea 2019   Anaphylactic shock due to adverse food reaction 01/15/2018   Anxiety 2017   ASD (atrial septal defect) 11/22/2022    Chronic constipation 09/05/2011   Chronic tension headaches 2011   originally followed by [facility] K.Griffin. Then Dr Consuello Bossier [facility] 03/2020 (see above)    Coalition, calcaneus navicular    Dysmenorrhea    Eczema    Eustachian tube dysfunction, left 10/23/2019   Frequent headaches    Galactorrhea    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 04/01/2019   Hiatal hernia    History of atrial septal defect repair 12/04/2011   Hyperlipidemia 2013   Hypertension 2011   Hypertension 09/28/2020   hypertension, cellulitus    Idiopathic urticaria 09/05/2011   Insomnia    Keratosis pilaris 01/15/2018   Mass of left thigh 11/22/2017   Overview:  Added automatically from request for surgery [id number]   MDD (major depressive disorder), severe ([place]) 03/06/2018   Migraine 2011   re-diagnosed as chronic migraines 03/2020 [facility] - starting on preventative    Obesity without serious comorbidity with body mass index (BMI) in 95th to 98th percentile for age in pediatric patient 09/04/2018   Piriformis syndrome    POTS (postural orthostatic tachycardia syndrome)    PTSD (post-traumatic stress disorder)    Scoliosis 2019   Seasonal and perennial allergic rhinitis 2010   Severe persistent asthma without complication [id number]   Sleep paralysis    Suicidal ideation 03/07/2018   Tic disorder 2019    Past Surgical History:  Procedure Laterality Date   ASD REPAIR  2012   with Helix, via Cardiac Catheterization   atrial septal defecgt     [place]  2020   lipoma removal  2019   LUMBAR PUNCTURE  05/20/2021   TONSILLECTOMY AND ADENOIDECTOMY  2010    Social History   Socioeconomic History   Marital status: Single    Spouse name: Not on file   Number of children: Not on file   Years of education: Not on file   Highest education level: Not on file  Occupational History   Not on file  Tobacco Use   Smoking status: Never   Smokeless tobacco: Never  Vaping Use   Vaping Use:  Never used  Substance and Sexual Activity   Alcohol use: No    Alcohol/week: 0.0 standard drinks of alcohol   Drug use: No   Sexual activity: Yes  Other Topics Concern   Not on file  Social History Narrative   Not on file   Social Determinants of Health   Financial Resource Strain: Low Risk  (11/22/2022)   Overall Financial Resource Strain (CARDIA)    Difficulty of Paying Living Expenses: Not very hard  Food Insecurity: No Food Insecurity (11/22/2022)   Hunger Vital Sign    Worried About Running Out of Food in the Last Year: Never true    Ran Out of Food in the Last Year: Never true  Transportation Needs: No Transportation Needs (11/22/2022)   PRAPARE - Hydrologist (Medical): No    Lack of Transportation (Non-Medical): No  Physical Activity: Not on file  Stress: Not on file  Social Connections: Not on file  Intimate Partner Violence: Not on file    Outpatient Encounter Medications as of 02/07/2023  Medication Sig   albuterol (PROVENTIL) (2.5 MG/3ML) 0.083% nebulizer solution Take 3 mLs (2.5 mg total) by nebulization every 6 (six) hours as needed for wheezing or shortness of breath.   ascorbic acid (VITAMIN C) 500 MG tablet Take 500 mg by mouth daily.   atomoxetine (STRATTERA) 60 MG capsule Take 1 capsule (60 mg total) by mouth daily.   Azelastine-Fluticasone 137-50 MCG/ACT SUSP Place 1 spray into both nostrils 2 (two) times daily.   bisoprolol (ZEBETA) 10 MG tablet Take 1 tablet (10 mg total) by mouth daily.   budesonide-formoterol (SYMBICORT) 160-4.5 MCG/ACT inhaler Inhale 2 puffs into the lungs in the morning and at bedtime. (Patient taking differently: Inhale 1 puff into the lungs every morning.)   busPIRone (BUSPAR) 10 MG tablet Take 1 tablet (10 mg total) by mouth 2 (two) times daily.   calcium carbonate (OS-CAL) 1250 (500 Ca) MG chewable tablet Chew by mouth.   cariprazine (VRAYLAR) 1.5 MG capsule Take 1 capsule (1.5 mg total) by mouth daily.    Cholecalciferol 25 MCG (1000 UT) tablet Take 1 Units by mouth daily at 2 PM.   clobetasol (TEMOVATE) 0.05 % external solution APPLY TO AFFECTED AREA TWICE A DAY   clonazePAM (KLONOPIN) 0.5 MG tablet Take 1 tablet (0.5 mg total) by mouth 2 (two) times daily as needed for anxiety.   EPINEPHrine 0.3 mg/0.3 mL IJ SOAJ injection Inject 0.3 mg into the muscle as needed for anaphylaxis.   Galcanezumab-gnlm 120 MG/ML SOAJ Inject into the skin.   hydrOXYzine (VISTARIL) 25 MG capsule Take 1-2 capsules (25-50 mg total) by mouth daily as needed.   LINZESS 72 MCG capsule Take 72 mcg by mouth daily.   methocarbamol (ROBAXIN) 500 MG tablet Take 1 tablet (500 mg total) by mouth every 8 (eight) hours as needed for muscle spasms.   metoprolol tartrate (LOPRESSOR) 25 MG tablet Take  1 tablet (25 mg total) by mouth as needed.   montelukast (SINGULAIR) 10 MG tablet Take 1 tablet (10 mg total) by mouth daily.   Multiple Vitamin (MULTIVITAMIN) capsule Take by mouth.   mupirocin ointment (BACTROBAN) 2 % Apply 1 Application topically 2 (two) times daily.   nitrofurantoin, macrocrystal-monohydrate, (MACROBID) 100 MG capsule Take 1 capsule (100 mg total) by mouth 2 (two) times daily for 7 days.   norethindrone (AYGESTIN) 5 MG tablet Take 5 mg by mouth daily.   nystatin (MYCOSTATIN/NYSTOP) powder Apply 1 Application topically 3 (three) times daily. (Patient taking differently: Apply 1 Application topically as needed.)   nystatin cream (MYCOSTATIN) APPLY TO AFFECTED AREA TWICE A DAY (Patient taking differently: as needed.)   Olopatadine HCl 0.2 % SOLN Apply 1 drop to eye every morning.   ondansetron (ZOFRAN-ODT) 4 MG disintegrating tablet DISSOLVE ON TONGUE AT ONSET OF NAUSEA- 30 DAY SUPPLY   pantoprazole (PROTONIX) 40 MG tablet Take 1 tablet by mouth in the morning and at bedtime.   RESTASIS 0.05 % ophthalmic emulsion 1 drop 2 (two) times daily.   sertraline (ZOLOFT) 50 MG tablet Take 1.5 tablets (75 mg total) by mouth  daily.   SUMAtriptan (IMITREX) 100 MG tablet Take by mouth.   VENTOLIN HFA 108 (90 Base) MCG/ACT inhaler INHALE 2 PUFFS INTO THE LUNGS EVERY 4 HOURS AS NEEDED FOR WHEEZING OR SHORTNESS OF BREATH.   [DISCONTINUED] fexofenadine (ALLEGRA) 180 MG tablet Take 1 tablet (180 mg total) by mouth daily.   fexofenadine (ALLEGRA) 180 MG tablet Take 1 tablet (180 mg total) by mouth daily.   No facility-administered encounter medications on file as of 02/07/2023.    Allergies  Allergen Reactions   Glucosamine Forte [Nutritional Supplements] Anaphylaxis    Due to containing shellfish   Nutritional Supplements Anaphylaxis   Oyster Extract Anaphylaxis   Shellfish-Derived Products Anaphylaxis    Throat swelling   Cats Claw (Uncaria Tomentosa) Nausea And Vomiting   Diamox [Acetazolamide] Other (See Comments)    Numbness- feet, hands, lips   Lactose    Lactose Intolerance (Gi)    Topiramate Other (See Comments)    Numbness to face, hands and feet   Bromfed Dm [Pseudoeph-Bromphen-Dm] Anxiety    CARBOFED DM    Review of Systems  Constitutional:  Negative for activity change, appetite change, chills, diaphoresis, fatigue, fever and unexpected weight change.  HENT:  Positive for postnasal drip and rhinorrhea.   Eyes: Negative.  Negative for photophobia and visual disturbance.  Respiratory:  Negative for cough, chest tightness and shortness of breath.   Cardiovascular:  Negative for chest pain, palpitations and leg swelling.  Gastrointestinal:  Negative for abdominal pain, blood in stool, constipation, diarrhea, nausea and vomiting.  Endocrine: Negative.  Negative for polydipsia, polyphagia and polyuria.  Genitourinary:  Positive for dysuria and urgency. Negative for decreased urine volume, difficulty urinating, dyspareunia, enuresis, flank pain, frequency, genital sores, hematuria, hesitancy, menstrual problem, pelvic pain, vaginal bleeding, vaginal discharge and vaginal pain.  Musculoskeletal:  Negative  for arthralgias and myalgias.  Skin: Negative.   Allergic/Immunologic: Negative.   Neurological:  Negative for dizziness, tremors, seizures, syncope, facial asymmetry, speech difficulty, weakness, light-headedness, numbness and headaches.  Hematological: Negative.   Psychiatric/Behavioral:  Negative for confusion, hallucinations, sleep disturbance and suicidal ideas.   All other systems reviewed and are negative.       Objective:  BP 123/81   Pulse (!) 101   Temp 97.6 F (36.4 C) (Temporal)   Ht '4\' 11"'$  (1.499  m)   Wt 251 lb 9.6 oz (114.1 kg)   SpO2 100%   BMI 50.82 kg/m    Wt Readings from Last 3 Encounters:  02/07/23 251 lb 9.6 oz (114.1 kg) (>99 %, Z= 2.52)*  02/01/23 246 lb 9.6 oz (111.9 kg) (>99 %, Z= 2.47)*  01/12/23 250 lb 12.8 oz (113.8 kg) (>99 %, Z= 2.51)*   * Growth percentiles are based on CDC (Girls, 2-20 Years) data.    Physical Exam Vitals and nursing note reviewed.  Constitutional:      General: She is not in acute distress.    Appearance: Normal appearance. She is morbidly obese. She is not ill-appearing, toxic-appearing or diaphoretic.  HENT:     Head: Normocephalic and atraumatic.     Nose: No congestion.     Mouth/Throat:     Mouth: Mucous membranes are moist.  Eyes:     Conjunctiva/sclera: Conjunctivae normal.     Pupils: Pupils are equal, round, and reactive to light.  Cardiovascular:     Rate and Rhythm: Normal rate and regular rhythm.     Heart sounds: Normal heart sounds.  Pulmonary:     Breath sounds: Normal breath sounds.  Abdominal:     Tenderness: There is no right CVA tenderness or left CVA tenderness.  Skin:    General: Skin is warm and dry.     Capillary Refill: Capillary refill takes less than 2 seconds.  Neurological:     General: No focal deficit present.     Mental Status: She is alert and oriented to person, place, and time.  Psychiatric:        Mood and Affect: Mood normal.        Behavior: Behavior normal. Behavior is  cooperative.        Thought Content: Thought content normal.        Judgment: Judgment normal.     Results for orders placed or performed in visit on 02/07/23  WET PREP FOR [place], YEAST, CLUE   Specimen: Vaginal Swab   Vaginal Swab  Result Value Ref Range   Trichomonas Exam Negative Negative   Yeast Exam Negative Negative   Clue Cell Exam Negative Negative  Microscopic Examination   Urine  Result Value Ref Range   WBC, UA 11-30 (A) 0 - 5 /hpf   RBC, Urine None seen 0 - 2 /hpf   Epithelial Cells (non renal) 0-10 0 - 10 /hpf   Renal Epithel, UA None seen None seen /hpf   Bacteria, UA Few (A) None seen/Few  Urinalysis, Routine w reflex microscopic  Result Value Ref Range   Specific Gravity, UA 1.015 1.005 - 1.030   pH, UA 6.5 5.0 - 7.5   Color, UA Yellow Yellow   Appearance Ur Clear Clear   Leukocytes,UA 3+ (A) Negative   Protein,UA Negative Negative/Trace   Glucose, UA Negative Negative   Ketones, UA Negative Negative   RBC, UA Trace (A) Negative   Bilirubin, UA Negative Negative   Urobilinogen, Ur 0.2 0.2 - 1.0 mg/dL   Nitrite, UA Negative Negative   Microscopic Examination See below:        Pertinent labs & imaging results that were available during my care of the patient were reviewed by me and considered in my medical decision making.  Assessment & Plan:  Karyl was seen today for dysuria and vaginal itching.  Diagnoses and all orders for this visit:  Dysuria Urinalysis consistent with cystitis. Wet prep negative.  -  Urinalysis, Routine w reflex microscopic -     WET PREP FOR TRICH, YEAST, CLUE -     Microscopic Examination  Acute cystitis with hematuria Macrobid as prescribed. Urine culture pending, will change regimen if warranted.  -     Urine Culture -     nitrofurantoin, macrocrystal-monohydrate, (MACROBID) 100 MG capsule; Take 1 capsule (100 mg total) by mouth 2 (two) times daily for 7 days.  Seasonal allergic rhinitis due to pollen Will  refill below as this has been very beneficial.  -     fexofenadine (ALLEGRA) 180 MG tablet; Take 1 tablet (180 mg total) by mouth daily.     Continue all other maintenance medications.  Follow up plan: Return if symptoms worsen or fail to improve.   Continue healthy lifestyle choices, including diet (rich in fruits, vegetables, and lean proteins, and low in salt and simple carbohydrates) and exercise (at least 30 minutes of moderate physical activity daily).   The above assessment and management plan was discussed with the patient. The patient verbalized understanding of and has agreed to the management plan. Patient is aware to call the clinic if they develop any new symptoms or if symptoms persist or worsen. Patient is aware when to return to the clinic for a follow-up visit. Patient educated on when it is appropriate to go to the emergency department.   Monia Pouch, FNP-C [facility] [phone]

## 2023-02-09 ENCOUNTER — Encounter: Payer: Self-pay | Admitting: Family Medicine

## 2023-02-09 LAB — URINE CULTURE

## 2023-02-09 MED ORDER — AMOXICILLIN-POT CLAVULANATE 875-125 MG PO TABS: 1.0000 | ORAL_TABLET | Freq: Two times a day (BID) | ORAL | 0 refills | Status: AC

## 2023-02-09 NOTE — Addendum Note (Signed)
Addended by: Baruch Gouty on: 02/09/2023 02:52 PM   Modules accepted: Orders

## 2023-02-14 ENCOUNTER — Encounter: Payer: Self-pay | Admitting: Pulmonary Disease

## 2023-02-14 ENCOUNTER — Ambulatory Visit (INDEPENDENT_AMBULATORY_CARE_PROVIDER_SITE_OTHER): Payer: Medicaid Other | Admitting: Allergy & Immunology

## 2023-02-14 VITALS — BP 124/72 | HR 99 | Temp 97.2°F | Resp 20 | Ht 59.0 in | Wt 248.0 lb

## 2023-02-14 DIAGNOSIS — J4550 Severe persistent asthma, uncomplicated: Secondary | ICD-10-CM | POA: Diagnosis not present

## 2023-02-14 DIAGNOSIS — T7800XD Anaphylactic reaction due to unspecified food, subsequent encounter: Secondary | ICD-10-CM

## 2023-02-14 DIAGNOSIS — B999 Unspecified infectious disease: Secondary | ICD-10-CM

## 2023-02-14 DIAGNOSIS — L2084 Intrinsic (allergic) eczema: Secondary | ICD-10-CM | POA: Diagnosis not present

## 2023-02-14 DIAGNOSIS — R5383 Other fatigue: Secondary | ICD-10-CM

## 2023-02-14 DIAGNOSIS — J3089 Other allergic rhinitis: Secondary | ICD-10-CM

## 2023-02-14 DIAGNOSIS — G901 Familial dysautonomia [Riley-Day]: Secondary | ICD-10-CM

## 2023-02-14 DIAGNOSIS — R61 Generalized hyperhidrosis: Secondary | ICD-10-CM

## 2023-02-14 DIAGNOSIS — J302 Other seasonal allergic rhinitis: Secondary | ICD-10-CM

## 2023-02-14 MED ORDER — EPINEPHRINE 0.3 MG/0.3ML IJ SOAJ: 0.3000 mg | INTRAMUSCULAR | 2 refills | Status: DC | PRN

## 2023-02-14 MED ORDER — BUDESONIDE-FORMOTEROL FUMARATE 160-4.5 MCG/ACT IN AERO: 2.0000 | INHALATION_SPRAY | Freq: Two times a day (BID) | RESPIRATORY_TRACT | 5 refills | Status: DC

## 2023-02-14 MED ORDER — AZELASTINE-FLUTICASONE 137-50 MCG/ACT NA SUSP: 1.0000 | Freq: Two times a day (BID) | NASAL | 5 refills | Status: DC

## 2023-02-14 MED ORDER — MONTELUKAST SODIUM 10 MG PO TABS: 10.0000 mg | ORAL_TABLET | Freq: Every day | ORAL | 5 refills | Status: DC

## 2023-02-14 NOTE — Progress Notes (Signed)
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 Levonne [name]    Plan/Recommendations:   1. Intrinsic atopic dermatitis  - Continue with the triamcinolone ointment as needed.  - Continue with moisturizing twice daily.   2. Moderate persistent asthma, uncomplicated - Lung testing looked AMAZING today!  - We will change to the SMART therapy. - Daily controller medication(s): Symbicort 160/4.5 mcg one puff once daily - Rescue medications: Symbicort 160/4.72mcg two puffs every 4-6 hours as needed - Asthma control goals:  * Full participation in all desired activities (may need albuterol before activity) * Albuterol use two time or less a week on average (not counting use with activity) * Cough interfering with sleep two time or less a month * Oral steroids no more than once a year * No hospitalizations  3. Chronic allergic rhinitis (indoor molds, outdoor molds, dust mites and cat) - Your nose looks great today!  - Continue with: Singulair (montelukast) 10mg  daily and Dymista (fluticasone/azelastine) two sprays per nostril 1-2 times daily as needed  - Continue with: Allegra and Claritin as you are doing.  - Consider nasal saline rinses 1-2 times daily to remove allergens from the nasal cavities as well as help with mucous clearance (this is  especially helpful to do before the nasal sprays are given)  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  - Refill sent in today.   5. Dysautonomia - We will get a referral made for Endocrinology. - We will also get a referral made for a home sleep study.   6. Return in about 6 months (around 08/17/2023).   Subjective:   Molly [name] is a 22 y.o. female presenting today for follow up of  Chief Complaint  Patient presents with   Follow-up    Feels a sensation in the throat. Pcp said it was most likely drainage.     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   Insomnia 06/10/2020   Sleep paralysis 06/10/2020   Dysmenorrhea 03/23/2020   Menorrhagia with irregular cycle 03/23/2020   Fatigue 03/23/2020   ADHD (attention deficit hyperactivity disorder) 03/09/2020   Unexplained dysphagia 01/21/2020   Anxiety 10/23/2019   Eustachian tube dysfunction, left 10/23/2019   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 04/01/2019   Obesity without serious comorbidity with body mass index (BMI) in 95th to 98th percentile for age in pediatric patient 09/04/2018   PTSD (post-traumatic stress disorder) 03/07/2018   MDD (major depressive disorder), severe ([place]) 03/06/2018  Keratosis pilaris 01/15/2018   Severe persistent asthma, uncomplicated 01/15/2018   Intrinsic atopic dermatitis 01/15/2018   Seasonal and perennial allergic rhinitis 01/15/2018   Anaphylactic shock due to adverse food reaction 01/15/2018   Tic disorder 2019   Scoliosis 2019   Adverse food reaction 07/11/2016   Chronic migraine w/o aura w/o status migrainosus, not  intractable 05/07/2012   Hyperlipidemia 2013   History of atrial septal defect repair 12/04/2011   Idiopathic urticaria 09/05/2011   Chronic constipation 09/05/2011   Hypertension 2011    History obtained from: chart review and patient and mother.  Molly [name] is a 22 y.o. female presenting for a follow up visit.  We last saw her in September 2023.  At that time.  Lung testing looked amazing.  Kids as well as Symbicort 160 mcg 1 puff once daily, increasing to 2 puffs every 4-6 hours as needed.  Early allergic rhinitis, we continue with the Singulair and Dymista.  She continues to avoid fish and blueberry.  EpiPen is up-to-date.  Since last visit, she has mostly done well.   Asthma/Respiratory Symptom History: She has been having some trouble with coughing and occasional trouble with activity and SOB. She is doing SMART therapy which seems to be working well.  The SMART therapy has been very helpful in getting her asthma under control. She has been fairly stable on the one puff once daily, but occasionally she does have to increase her dosing to remain out of the ED and off of prednisone. She is not having a lot of coughing at all.   Allergic Rhinitis Symptom History: Allergies have been flaring up.  She has a lot of drainage. She was placed on Claritin in addition to her Allegra. She has not bee using any nasal sprays on a routine basis, but she does have Dymista. We did testing at the last visit and se had very elevated IgE to dust mites. I have mentioned allergy shots in the past, but they have never been interested in pursuing these.  She did have Augmentin early March 2024 for a UTI (beta hemolytic Strep). It seems that a lot of her antibiotic use has decreased over time. She has a current UTI but no sinopulmonary infections.   She was referred to Endocrinology, referred by her providers who diagnosed her with dysautonomia. She has a lot of sweating which is one of her main complaints. She  shows me pictures of her being rather glossy just by riding in the car. She had a 24 hour urine to check for pheochromocytoma and carcinoid. She sees Dr. Chilton [name] who is a Development worker, international aid at [employer]. She previously did see Dr. Meredith Mody who sent her to see Dr. Duke Salvia manage this. Referral was placed in January and per the referral was declined due to a shortage of providers.   They are waiting on a home sleep study. This was referred by Dr. Vassie Loll, but they have not heard anything about the home sleep study. There is a referral from early December 2023, but it looks like this was to Pulmonology (Dr. Vassie Loll). Per the last clinic note from February, there was a home sleep study ordered. They have heard nothing about it, however. CPAP use was discussed.   Otherwise, there have been no changes to her past medical history, surgical history, family history, or social history.    Review of Systems  Constitutional:  Positive for malaise/fatigue. Negative for chills, fever and weight loss.  HENT: Negative.  Negative for congestion, ear  discharge, ear pain and sinus pain.   Eyes:  Negative for pain, discharge and redness.  Respiratory:  Positive for cough. Negative for sputum production, shortness of breath and wheezing.   Cardiovascular: Negative.  Negative for chest pain and palpitations.  Gastrointestinal:  Negative for abdominal pain, constipation, diarrhea, heartburn, nausea and vomiting.  Skin: Negative.  Negative for itching and rash.  Neurological:  Negative for dizziness and headaches.  Endo/Heme/Allergies:  Positive for environmental allergies. Does not bruise/bleed easily.       Objective:   Blood pressure 124/72, pulse 99, temperature (!) 97.2 F (36.2 C), resp. rate 20, height 4\' 11"  (1.499 m), weight 248 lb (112.5 kg), SpO2 97 %. Body mass index is 50.09 kg/m.    Physical Exam Vitals reviewed.  Constitutional:      Appearance: She is well-developed.     Comments: Overall, she  looks very good today. She is smiling and interactive.   HENT:     Head: Normocephalic and atraumatic.     Right Ear: Tympanic membrane, ear canal and external ear normal.     Left Ear: Tympanic membrane, ear canal and external ear normal.     Nose: Mucosal edema and rhinorrhea present. No nasal deformity or septal deviation.     Right Turbinates: Enlarged, swollen and pale.     Left Turbinates: Enlarged, swollen and pale.     Right Sinus: No maxillary sinus tenderness or frontal sinus tenderness.     Left Sinus: No maxillary sinus tenderness or frontal sinus tenderness.     Comments: No nasal polyps.    Mouth/Throat:     Mouth: Mucous membranes are not pale and not dry.     Pharynx: Uvula midline.  Eyes:     General: Lids are normal. No allergic shiner.       Right eye: No discharge.        Left eye: No discharge.     Conjunctiva/sclera: Conjunctivae normal.     Right eye: Right conjunctiva is not injected. No chemosis.    Left eye: Left conjunctiva is not injected. No chemosis.    Pupils: Pupils are equal, round, and reactive to light.  Cardiovascular:     Rate and Rhythm: Normal rate and regular rhythm.     Heart sounds: Normal heart sounds.  Pulmonary:     Effort: Pulmonary effort is normal. No tachypnea, accessory muscle usage or respiratory distress.     Breath sounds: Normal breath sounds. No wheezing, rhonchi or rales.     Comments: Moving air well in all lung fields.  No increased work of breathing. Chest:     Chest wall: No tenderness.  Lymphadenopathy:     Cervical: No cervical adenopathy.  Skin:    General: Skin is warm.     Capillary Refill: Capillary refill takes less than 2 seconds.     Coloration: Skin is not pale.     Findings: No abrasion, erythema, petechiae or rash. Rash is not papular, urticarial or vesicular.     Comments: No eczematous or urticarial lesions noted.   Neurological:     Mental Status: She is alert.  Psychiatric:        Behavior:  Behavior is cooperative.      Diagnostic studies:    Spirometry: results normal (FEV1: 2.57/92%, FVC: 3.07/97%, FEV1/FVC: 84%).    Spirometry consistent with normal pattern.    Allergy Studies: none        Salvatore Marvel, MD  [facility]

## 2023-02-14 NOTE — Patient Instructions (Addendum)
1. Intrinsic atopic dermatitis  - Continue with the triamcinolone ointment as needed.  - Continue with moisturizing twice daily.   2. Moderate persistent asthma, uncomplicated - Lung testing looked AMAZING today!  - We will change to the SMART therapy. - Daily controller medication(s): Symbicort 160/4.5 mcg one puff once daily - Rescue medications: Symbicort 160/4.69mg two puffs every 4-6 hours as needed - Asthma control goals:  * Full participation in all desired activities (may need albuterol before activity) * Albuterol use two time or less a week on average (not counting use with activity) * Cough interfering with sleep two time or less a month * Oral steroids no more than once a year * No hospitalizations  3. Chronic allergic rhinitis (indoor molds, outdoor molds, dust mites and cat) - Your nose looks great today!  - Continue with: Singulair (montelukast) '10mg'$  daily and Dymista (fluticasone/azelastine) two sprays per nostril 1-2 times daily as needed  - Continue with: Allegra and Claritin as you are doing.  - Consider nasal saline rinses 1-2 times daily to remove allergens from the nasal cavities as well as help with mucous clearance (this is especially helpful to do before the nasal sprays are given)  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  - Refill sent in today.   5. Dysautonomia - We will get a referral made for Endocrinology. - We will also get a referral made for a home sleep study.   6. Return in about 6 months (around 08/17/2023).     Please inform [place] any Emergency Department visits, hospitalizations, or changes in symptoms. Call [place]efore going to the ED for breathing or allergy symptoms since we might be able to fit you in for a sick visit. Feel free to contact u[place]anytime with any questions, problems, or concerns.  It was a pleasure to see you again today!  Websites that have reliable patient information: 1. American Academy of  Asthma, Allergy, and Immunology: www.aaaai.org 2. Food Allergy Research and Education (FARE): foodallergy.org 3. Mothers of Asthmatics: http://www.asthmacommunitynetwork.org 4. American College of Allergy, Asthma, and Immunology: www.acaai.org   COVID-19 Vaccine Information can be found at: [email or web address] questions related to vaccine distribution or appointments, please email [email or web address] or call [phone]   We realize that you might be concerned about having an allergic reaction to the COVID19 vaccines. To help with that concern, WE ARE OFFERING THE COVID19 VACCINES IN OUR OFFICE! Ask the front desk for dates!     "Like" [facility] Facebook and Instagram for our latest updates!      A healthy democracy works best when ANew [employer] Life Insuranceparticipate! Make sure you are registered to vote! If you have moved or changed any of your contact information, you will need to get this updated before voting!  In some cases, you MAY be able to register to vote online: [email or web address]

## 2023-02-16 ENCOUNTER — Telehealth: Payer: Self-pay | Admitting: *Deleted

## 2023-02-16 NOTE — Telephone Encounter (Signed)
Order and Office Notes have been faxed to [facility] for Sleep Apnea Test to [phone]. Received fax stating that they have received the order and they are reaching out to the patient to complete registration, company will send another fax once equipment has shipped.

## 2023-02-17 ENCOUNTER — Encounter: Payer: Self-pay | Admitting: Allergy & Immunology

## 2023-02-18 ENCOUNTER — Encounter: Payer: Self-pay | Admitting: Pulmonary Disease

## 2023-02-18 ENCOUNTER — Encounter (HOSPITAL_BASED_OUTPATIENT_CLINIC_OR_DEPARTMENT_OTHER): Payer: Self-pay

## 2023-02-19 NOTE — Progress Notes (Signed)
LVM for patient or parent to call the office back to discuss Dr. Gillermina Hu notes.

## 2023-02-19 NOTE — Telephone Encounter (Signed)
Patient's mother, Janett Billow, called in - DOB verified/No DPR on file - mom stated patient was laying down w/migraine. Mom advised no DPR on file - need verbal authorization to speak to speak to her - please have patient check her myChart for purpose of call.  Mom verbalized understanding, no further questions.

## 2023-02-20 ENCOUNTER — Encounter: Payer: Self-pay | Admitting: Family Medicine

## 2023-02-20 NOTE — Progress Notes (Signed)
Spoke to mother and she stated that she understood. She plans to take the patient to a [facility] facility to get the labs done at her convenience.

## 2023-02-21 ENCOUNTER — Encounter: Payer: Self-pay | Admitting: Allergy & Immunology

## 2023-02-28 ENCOUNTER — Telehealth: Payer: Self-pay | Admitting: "Endocrinology

## 2023-02-28 NOTE — Telephone Encounter (Signed)
Dr Dorris Fetch Please review for Hyperhidrosis

## 2023-03-01 ENCOUNTER — Ambulatory Visit (HOSPITAL_BASED_OUTPATIENT_CLINIC_OR_DEPARTMENT_OTHER): Payer: Medicaid Other | Admitting: Family

## 2023-03-01 ENCOUNTER — Encounter (HOSPITAL_COMMUNITY): Payer: Self-pay | Admitting: Psychiatry

## 2023-03-01 ENCOUNTER — Telehealth (HOSPITAL_COMMUNITY): Payer: No Typology Code available for payment source | Admitting: Psychiatry

## 2023-03-01 DIAGNOSIS — F333 Major depressive disorder, recurrent, severe with psychotic symptoms: Secondary | ICD-10-CM

## 2023-03-01 DIAGNOSIS — F902 Attention-deficit hyperactivity disorder, combined type: Secondary | ICD-10-CM | POA: Diagnosis not present

## 2023-03-01 MED ORDER — SERTRALINE HCL 50 MG PO TABS: 75.0000 mg | ORAL_TABLET | Freq: Every day | ORAL | 2 refills | Status: DC

## 2023-03-01 MED ORDER — ATOMOXETINE HCL 60 MG PO CAPS: 60.0000 mg | ORAL_CAPSULE | Freq: Every day | ORAL | 2 refills | Status: DC

## 2023-03-01 MED ORDER — BUSPIRONE HCL 10 MG PO TABS: 10.0000 mg | ORAL_TABLET | Freq: Two times a day (BID) | ORAL | 2 refills | Status: DC

## 2023-03-01 MED ORDER — CARIPRAZINE HCL 1.5 MG PO CAPS: 1.5000 mg | ORAL_CAPSULE | Freq: Every day | ORAL | 2 refills | Status: DC

## 2023-03-01 MED ORDER — CLONAZEPAM 0.5 MG PO TABS: 0.5000 mg | ORAL_TABLET | Freq: Two times a day (BID) | ORAL | 2 refills | Status: DC | PRN

## 2023-03-01 MED ORDER — HYDROXYZINE PAMOATE 25 MG PO CAPS: 25.0000 mg | ORAL_CAPSULE | Freq: Every day | ORAL | 2 refills | Status: DC | PRN

## 2023-03-01 NOTE — Progress Notes (Signed)
BH MD/PA/NP OP Progress Note  03/01/2023 1:25 PM Molly [name]  MRN:  [id number]  Chief Complaint:  Chief Complaint  Patient presents with   Depression   Anxiety   Follow-up   HPI: This patient is a 22 year old female who lives with her mother, mother's fianc and a younger brother in [place]. 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   The patient returns for follow-up after 2 months.  She still having the episodes of feeling lightheaded dizzy with elevated heart rate several times a day.  She has been previously diagnosed with POTS.  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  In terms of mood she states that she is doing well.  She denies significant depression but some anxiety mostly over her health.  She is sleeping fairly well and her energy is fairly good but she does get fatigued easily.  She is trying to lose weight but cannot really exercise vertically because of the orthostasis.  She still feels the medications are helpful for her mood swings depression and anxiety.  She is focusing fairly well with the Strattera Visit Diagnosis:    ICD-10-CM   1. Severe episode of recurrent major depressive disorder, with psychotic features ([place])  F33.3     2. Attention deficit hyperactivity disorder (ADHD), combined type  F90.2       Past Psychiatric History: Past psychiatric hospitalizations at a younger age, 2 [facility]  Past Medical History:  Past Medical History:  Diagnosis Date   Acid reflux 2010   Acne vulgaris 2017   ADHD (attention deficit hyperactivity disorder) 2011   Adverse food reaction 07/11/2016   Shellfish allergy   Amenorrhea 2019   Anaphylactic shock due to adverse food reaction 01/15/2018   Anxiety 2017    ASD (atrial septal defect) 11/22/2022   Chronic constipation 09/05/2011   Chronic tension headaches 2011   originally followed by [facility] K.Griffin. Then Dr Consuello [place] [facility] 03/2020 (see above)    Coalition, calcaneus navicular    Dysmenorrhea    Eczema    Eustachian tube dysfunction, left 10/23/2019   Frequent headaches    Galactorrhea    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 04/01/2019   Hiatal hernia    History of atrial septal defect repair 12/04/2011   Hyperlipidemia 2013   Hypertension 2011   Hypertension 09/28/2020   hypertension, cellulitus    Idiopathic urticaria 09/05/2011   Insomnia    Keratosis pilaris 01/15/2018   Mass of left thigh 11/22/2017   Overview:  Added automatically from request for surgery [id number]   MDD (major depressive disorder), severe ([place]) 03/06/2018   Migraine 2011   re-diagnosed as chronic migraines 03/2020 [facility] - starting on preventative    Obesity without serious comorbidity with body mass index (BMI) in 95th to 98th percentile for age in pediatric patient 09/04/2018   Piriformis syndrome    POTS (postural orthostatic tachycardia syndrome)    PTSD (post-traumatic stress disorder)    Scoliosis 2019   Seasonal and perennial allergic rhinitis 2010   Severe persistent asthma without complication [id number]   Sleep paralysis    Suicidal ideation 03/07/2018   Tic disorder 2019    Past Surgical History:  Procedure  Laterality Date   ASD REPAIR  2012   with Helix, via Cardiac Catheterization   atrial septal defecgt     CARDIAC SURGERY     DENTAL SURGERY  2020   lipoma removal  2019   LUMBAR PUNCTURE  05/20/2021   TONSILLECTOMY AND ADENOIDECTOMY  2010    Family Psychiatric History: See below  Family History:  Family History  Problem Relation Age of Onset   Hypertension Mother    Hyperlipidemia Mother    Diabetes Mother    Depression Mother    Arthritis Mother    Allergic rhinitis Mother    Asthma Mother    Bipolar disorder Mother     Anxiety disorder Mother    Mental illness Mother    Miscarriages / [place] Mother    Thyroid disease Mother    Bipolar disorder Father    Drug abuse Father    Alcohol abuse Father    ADD / ADHD Father    Mental illness Father    Allergic rhinitis Brother    Asthma Brother    ADD / ADHD Brother    Learning disabilities Brother    Hypertension Maternal Grandmother    Hyperlipidemia Maternal Grandmother    Heart disease Maternal Grandmother    Heart attack Maternal Grandfather    Bipolar disorder Maternal Grandfather    Angioedema Neg Hx    Eczema Neg Hx    Immunodeficiency Neg Hx    Urticaria Neg Hx     Social History:  Social History   Socioeconomic History   Marital status: Single    Spouse name: Not on file   Number of children: Not on file   Years of education: Not on file   Highest education level: Not on file  Occupational History   Not on file  Tobacco Use   Smoking status: Never   Smokeless tobacco: Never  Vaping Use   Vaping Use: Never used  Substance and Sexual Activity   Alcohol use: No    Alcohol/week: 0.0 standard drinks of alcohol   Drug use: No   Sexual activity: Yes  Other Topics Concern   Not on file  Social History Narrative   Not on file   Social Determinants of Health   Financial Resource Strain: Low Risk  (11/22/2022)   Overall Financial Resource Strain (CARDIA)    Difficulty of Paying Living Expenses: Not very hard  Food Insecurity: No Food Insecurity (11/22/2022)   Hunger Vital Sign    Worried About Running Out of Food in the Last Year: Never true    Ran Out of Food in the Last Year: Never true  Transportation Needs: No Transportation Needs (11/22/2022)   PRAPARE - Hydrologist (Medical): No    Lack of Transportation (Non-Medical): No  Physical Activity: Not on file  Stress: Not on file  Social Connections: Not on file    Allergies:  Allergies  Allergen Reactions   Glucosamine Forte  [Nutritional Supplements] Anaphylaxis    Due to containing shellfish   Nutritional Supplements Anaphylaxis   Oyster Extract Anaphylaxis   Shellfish-Derived Products Anaphylaxis    Throat swelling   Cats Claw (Uncaria Tomentosa) Nausea And Vomiting   Diamox [Acetazolamide] Other (See Comments)    Numbness- feet, hands, lips   Lactose    Lactose Intolerance (Gi)    Topiramate Other (See Comments)    Numbness to face, hands and feet   Bromfed Dm [Pseudoeph-Bromphen-Dm] Anxiety    CARBOFED DM  Metabolic Disorder Labs: Lab Results  Component Value Date   HGBA1C 5.8 (H) 10/11/2022   MPG 105.41 03/07/2018   Lab Results  Component Value Date   PROLACTIN 49.7 (H) 03/07/2018   Lab Results  Component Value Date   CHOL 191 (H) 10/11/2022   TRIG 188 (H) 10/11/2022   HDL 37 (L) 10/11/2022   CHOLHDL 5.2 (H) 10/11/2022   VLDL 20 03/07/2018   LDLCALC 121 (H) 10/11/2022   LDLCALC 138 (H) 03/06/2022   Lab Results  Component Value Date   TSH 1.550 03/06/2022   TSH 2.434 03/07/2018    Therapeutic Level Labs: No results found for: "LITHIUM" No results found for: "VALPROATE" No results found for: "CBMZ"  Current Medications: Current Outpatient Medications  Medication Sig Dispense Refill   albuterol (PROVENTIL) (2.5 MG/3ML) 0.083% nebulizer solution Take 3 mLs (2.5 mg total) by nebulization every 6 (six) hours as needed for wheezing or shortness of breath. 75 mL 1   ascorbic acid (VITAMIN C) 500 MG tablet Take 500 mg by mouth daily.     atomoxetine (STRATTERA) 60 MG capsule Take 1 capsule (60 mg total) by mouth daily. 30 capsule 2   Azelastine-Fluticasone 137-50 MCG/ACT SUSP Place 1 spray into both nostrils 2 (two) times daily. 23 g 5   bisoprolol (ZEBETA) 10 MG tablet Take 1 tablet (10 mg total) by mouth daily. 90 tablet 1   budesonide-formoterol (SYMBICORT) 160-4.5 MCG/ACT inhaler Inhale 2 puffs into the lungs in the morning and at bedtime. 1 each 5   busPIRone (BUSPAR) 10 MG  tablet Take 1 tablet (10 mg total) by mouth 2 (two) times daily. 60 tablet 2   calcium carbonate (OS-CAL) 1250 (500 Ca) MG chewable tablet Chew by mouth.     cariprazine (VRAYLAR) 1.5 MG capsule Take 1 capsule (1.5 mg total) by mouth daily. 30 capsule 2   Cholecalciferol 25 MCG (1000 UT) tablet Take 1 Units by mouth daily at 2 PM.     clobetasol (TEMOVATE) 0.05 % external solution APPLY TO AFFECTED AREA TWICE A DAY 50 mL 3   clonazePAM (KLONOPIN) 0.5 MG tablet Take 1 tablet (0.5 mg total) by mouth 2 (two) times daily as needed for anxiety. 60 tablet 2   EPINEPHrine 0.3 mg/0.3 mL IJ SOAJ injection Inject 0.3 mg into the muscle as needed for anaphylaxis. 2 each 2   fexofenadine (ALLEGRA) 180 MG tablet Take 1 tablet (180 mg total) by mouth daily. 30 tablet 5   Galcanezumab-gnlm 120 MG/ML SOAJ Inject into the skin.     hydrOXYzine (VISTARIL) 25 MG capsule Take 1-2 capsules (25-50 mg total) by mouth daily as needed. 60 capsule 2   LINZESS 72 MCG capsule Take 72 mcg by mouth daily.     methocarbamol (ROBAXIN) 500 MG tablet Take 1 tablet (500 mg total) by mouth every 8 (eight) hours as needed for muscle spasms. 30 tablet 0   metoprolol tartrate (LOPRESSOR) 25 MG tablet Take 1 tablet (25 mg total) by mouth as needed. 15 tablet 1   montelukast (SINGULAIR) 10 MG tablet Take 1 tablet (10 mg total) by mouth daily. 30 tablet 5   Multiple Vitamin (MULTIVITAMIN) capsule Take by mouth.     mupirocin ointment (BACTROBAN) 2 % Apply 1 Application topically 2 (two) times daily. (Patient not taking: Reported on 02/14/2023) 22 g 0   norethindrone (AYGESTIN) 5 MG tablet Take 5 mg by mouth daily.     nystatin (MYCOSTATIN/NYSTOP) powder Apply 1 Application topically 3 (three) times daily. (  Patient taking differently: Apply 1 Application topically as needed.) 15 g 0   nystatin cream (MYCOSTATIN) APPLY TO AFFECTED AREA TWICE A DAY (Patient taking differently: as needed.) 60 g 1   Olopatadine HCl 0.2 % SOLN Apply 1 drop to  eye every morning.     ondansetron (ZOFRAN-ODT) 4 MG disintegrating tablet DISSOLVE ON TONGUE AT ONSET OF NAUSEA- 30 DAY SUPPLY 10 tablet 1   pantoprazole (PROTONIX) 40 MG tablet Take 1 tablet by mouth in the morning and at bedtime.     RESTASIS 0.05 % ophthalmic emulsion 1 drop 2 (two) times daily.     sertraline (ZOLOFT) 50 MG tablet Take 1.5 tablets (75 mg total) by mouth daily. 45 tablet 2   SUMAtriptan (IMITREX) 100 MG tablet Take by mouth.     VENTOLIN HFA 108 (90 Base) MCG/ACT inhaler INHALE 2 PUFFS INTO THE LUNGS EVERY 4 HOURS AS NEEDED FOR WHEEZING OR SHORTNESS OF BREATH. 18 each 0   No current facility-administered medications for this visit.     Musculoskeletal: Strength & Muscle Tone: within normal limits Gait & Station: normal Patient leans: N/A  Psychiatric Specialty Exam: Review of Systems  Constitutional:  Positive for fatigue.  Neurological:  Positive for dizziness, light-headedness and headaches.  All other systems reviewed and are negative.   There were no vitals taken for this visit.There is no height or weight on file to calculate BMI.  General Appearance: Casual and Fairly Groomed  Eye Contact:  Good  Speech:  Clear and Coherent  Volume:  Normal  Mood:  Euthymic  Affect:  Appropriate and Congruent  Thought Process:  Goal Directed  Orientation:  Full (Time, Place, and Person)  Thought Content: WDL   Suicidal Thoughts:  No  Homicidal Thoughts:  No  Memory:  Immediate;   Good Recent;   Good Remote;   NA  Judgement:  Good  Insight:  Good  Psychomotor Activity:  Normal  Concentration:  Concentration: Good and Attention Span: Good  Recall:  Good  Fund of Knowledge: Good  Language: Good  Akathisia:  No  Handed:  Right  AIMS (if indicated): not done  Assets:  Communication Skills Desire for Improvement Resilience Social Support  ADL's:  Intact  Cognition: WNL  Sleep:  Good   Screenings: GAD-7    Flowsheet Row Office Visit from 01/12/2023 in [place] Office Visit from 11/07/2022 in [place] Office Visit from 10/11/2022 in [place] Office Visit from 09/07/2022 in [place] Office Visit from 08/15/2022 in [place]  Total GAD-7 Score 1 0 4 0 1      PHQ2-9    [place] Visit from 01/12/2023 in [place] Office Visit from 11/07/2022 in [place] Office Visit from 10/11/2022 in [place] Office Visit from 09/07/2022 in [place] Video Visit from 08/22/2022 in [place] at [facility] Total Score 1 0 1 0 0  PHQ-9 Total Score 2 1 4 2  --      Flowsheet Row Video Visit from 08/22/2022 in [place] at [place] Video Visit from 06/23/2022 in [place] at [place] Video Visit from 04/19/2022 in [place] at [place] No Risk No Risk No  Risk        Assessment and Plan: This patient is a 22 year old female with a history of depression anxiety possible bipolar disorder sleep difficulties ADD fibromyalgia chronic fatigue POTS possible sleep apnea.  Despite all this she seems to be doing well in terms of her mental state.  She will continue Strattera 60 mg daily for focus, Vraylar 1.5 mg daily for mood stabilization, Zoloft 75 mg daily for depression, BuSpar 10 mg twice daily for anxiety and hydroxyzine 25 to 50 mg at bedtime as needed for sleep.  She will return to see me with in 3 months  Collaboration of Care: Collaboration of Care: Primary Care Provider AEB notes are shared with PCP on the epic system  Patient/Guardian was advised Release of Information must be  obtained prior to any record release in order to collaborate their care with an outside provider. Patient/Guardian was advised if they have not already done so to contact the registration department to sign all necessary forms in order for [place] to release information regarding their care.   Consent: Patient/Guardian gives verbal consent for treatment and assignment of benefits for services provided during this visit. Patient/Guardian expressed understanding and agreed to proceed.    Levonne Spiller, MD 03/01/2023, 1:25 PM

## 2023-03-05 NOTE — Addendum Note (Signed)
Addended by: Gerald Stabs on: 03/05/2023 01:18 PM   Modules accepted: Orders

## 2023-03-06 ENCOUNTER — Ambulatory Visit (INDEPENDENT_AMBULATORY_CARE_PROVIDER_SITE_OTHER): Payer: Medicaid Other | Admitting: Adult Health

## 2023-03-06 ENCOUNTER — Encounter: Payer: Self-pay | Admitting: Adult Health

## 2023-03-06 VITALS — BP 124/83 | HR 96 | Ht 59.0 in | Wt 247.0 lb

## 2023-03-06 DIAGNOSIS — E282 Polycystic ovarian syndrome: Secondary | ICD-10-CM | POA: Diagnosis not present

## 2023-03-06 DIAGNOSIS — Z7689 Persons encountering health services in other specified circumstances: Secondary | ICD-10-CM | POA: Insufficient documentation

## 2023-03-06 DIAGNOSIS — R1032 Left lower quadrant pain: Secondary | ICD-10-CM | POA: Diagnosis not present

## 2023-03-06 MED ORDER — NORETHINDRONE 0.35 MG PO TABS: 1.0000 | ORAL_TABLET | Freq: Every day | ORAL | 11 refills | Status: DC

## 2023-03-06 NOTE — Progress Notes (Signed)
Subjective:     Patient ID: Molly [name], female   DOB: Mar 08, 2001, 22 y.o.   MRN: [id number]  HPI Molly [name] is a 22 year old white female,single, G0P0, in to discuss PCO, she is on Aygestin 5 mg daily for period management(was irregular and heavy before aygestin, now no period). She is sweating a lot and feels faint at times and blood sugar is in 70-80's and she will have tachycardia. She is seeing Dr Dorris Fetch in June.   PCP is Darla Lesches NP  Review of Systems +sweating a lot and feels faint at times and blood sugar is in 70-80's and she will have tachycardia.  Pain in left side at times Used to have heavy irregular periods Has heavy breasts Has never had sex Reviewed past medical,surgical, social and family history. Reviewed medications and allergies.     Objective:   Physical Exam BP 124/83 (BP Location: Right Arm, Patient Position: Sitting, Cuff Size: Large)   Pulse 96   Ht 4\' 11"  (1.499 m)   Wt 247 lb (112 kg)   BMI 49.89 kg/m     Skin warm and dry. Neck: mid line trachea, normal thyroid, good ROM, no lymphadenopathy noted. Lungs: clear to ausculation bilaterally. Cardiovascular: regular rate and rhythm.  AA is 0 Fall risk is high    03/06/2023    3:23 PM 01/12/2023   11:14 AM 11/07/2022   10:03 AM  Depression screen PHQ 2/9  Decreased Interest 0 1 0  Down, Depressed, Hopeless 0 0 0  PHQ - 2 Score 0 1 0  Altered sleeping 1 1 1   Tired, decreased energy 1 0 0  Change in appetite 1 0 0  Feeling bad or failure about yourself  0 0 0  Trouble concentrating 0 0 0  Moving slowly or fidgety/restless 0 0 0  Suicidal thoughts 0 0 0  PHQ-9 Score 3 2 1   Difficult doing work/chores   Not difficult at all       03/06/2023    3:23 PM 01/12/2023   11:14 AM 11/07/2022   10:04 AM 10/11/2022    3:12 PM  GAD 7 : Generalized Anxiety Score  Nervous, Anxious, on Edge 0 0 0 1  Control/stop worrying 0 0 0 1  Worry too much - different things 0 0 0 1  Trouble relaxing 0 1 0 1  Restless 0 0  0 0  Easily annoyed or irritable 0 0 0 0  Afraid - awful might happen 0 0 0 0  Total GAD 7 Score 0 1 0 4  Anxiety Difficulty  Somewhat difficult Somewhat difficult Somewhat difficult      Upstream - 03/06/23 1542       Pregnancy Intention Screening   Does the patient want to become pregnant in the next year? No    Does the patient's partner want to become pregnant in the next year? No    Would the patient like to discuss contraceptive options today? No      Contraception Wrap Up   Current Method Oral Contraceptive;Abstinence    End Method Oral Contraceptive;Abstinence    Contraception Counseling Provided Yes    How was the end contraceptive method provided? Prescription                Assessment:     1. Encounter for menstrual regulation Will stop Aygestin and try Micronor Discussed with Dr Elonda Husky  Meds ordered this encounter  Medications   norethindrone (MICRONOR) 0.35 MG tablet  Sig: Take 1 tablet (0.35 mg total) by mouth daily.    Dispense:  28 tablet    Refill:  11    Order Specific Question:   Supervising Provider    Answer:   Tania Ade H [[id number]]     2. PCO (polycystic ovaries) Will get [place] in 3 weeks to assess ovaries and uterus, in office  - US PELVIC COMPLETE WITH TRANSVAGINAL; Future  3. LLQ pain Will get [place] in about 3 weeks to assess uterus and ovaries  - US PELVIC COMPLETE WITH TRANSVAGINAL; Future  4. Morbid obesity     Plan:     [place] in 3 weeks  Follow up with me in 8 week for ROS

## 2023-03-13 ENCOUNTER — Encounter: Payer: Self-pay | Admitting: Family Medicine

## 2023-03-14 ENCOUNTER — Encounter: Payer: Self-pay | Admitting: Family Medicine

## 2023-03-14 ENCOUNTER — Telehealth (INDEPENDENT_AMBULATORY_CARE_PROVIDER_SITE_OTHER): Payer: Medicaid Other | Admitting: Family Medicine

## 2023-03-14 DIAGNOSIS — B372 Candidiasis of skin and nail: Secondary | ICD-10-CM

## 2023-03-14 MED ORDER — NYSTATIN-TRIAMCINOLONE 100000-0.1 UNIT/GM-% EX OINT
1.0000 | TOPICAL_OINTMENT | Freq: Two times a day (BID) | CUTANEOUS | 0 refills | Status: DC
Start: 2023-03-14 — End: 2023-09-12

## 2023-03-14 NOTE — Progress Notes (Signed)
Virtual Visit via Video   I connected with patient on 03/14/23 at 1300 by a video enabled telemedicine application and verified that I am speaking with the correct person using two identifiers.  Location patient: Home Location provider: [facility] Persons participating in the virtual visit: Patient and Provider  I discussed the limitations of evaluation and management by telemedicine and the availability of in person appointments. The patient expressed understanding and agreed to proceed.  Subjective:   HPI:  Pt presents today for  Chief Complaint  Patient presents with   Rash   Rash This is a new problem. The current episode started 1 to 4 weeks ago. The problem has been waxing and waning since onset. The affected locations include the left axilla and right axilla. The rash is characterized by burning, itchiness, peeling, redness and scaling. She was exposed to nothing. Pertinent negatives include no anorexia, congestion, cough, diarrhea, eye pain, facial edema, fatigue, fever, joint pain, nail changes, rhinorrhea, shortness of breath, sore throat or vomiting.     Review of Systems  Constitutional:  Negative for fatigue and fever.  HENT:  Negative for congestion, rhinorrhea and sore throat.   Eyes:  Negative for pain.  Respiratory:  Negative for cough and shortness of breath.   Gastrointestinal:  Negative for anorexia, diarrhea and vomiting.  Musculoskeletal:  Negative for joint pain.  Skin:  Positive for itching and rash. Negative for nail changes.  All other systems reviewed and are negative.    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   Insomnia 06/10/2020   Sleep paralysis 06/10/2020   Dysmenorrhea 03/23/2020   Menorrhagia with irregular cycle 03/23/2020   Fatigue 03/23/2020   ADHD (attention deficit hyperactivity disorder) 03/09/2020   Unexplained dysphagia 01/21/2020   Anxiety 10/23/2019   Eustachian tube dysfunction, left 10/23/2019   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 04/01/2019   Obesity without serious comorbidity with body mass index (BMI) in 95th to 98th percentile for age in pediatric patient 09/04/2018   PTSD (post-traumatic stress disorder) 03/07/2018   MDD (major depressive disorder), severe 03/06/2018   Keratosis pilaris 01/15/2018   Severe persistent asthma, uncomplicated 01/15/2018   Intrinsic atopic dermatitis 01/15/2018   Seasonal and perennial allergic rhinitis 01/15/2018   Anaphylactic shock due to adverse food reaction 01/15/2018   Tic disorder 2019   Scoliosis 2019   Adverse food reaction 07/11/2016   Chronic migraine w/o aura w/o status migrainosus, not intractable 05/07/2012   Hyperlipidemia 2013   History of atrial septal defect repair 12/04/2011   Idiopathic urticaria 09/05/2011   Chronic constipation 09/05/2011   Hypertension 2011    Social History   Tobacco Use   Smoking status: Never   Smokeless tobacco: Never  Substance Use Topics   Alcohol use: No    Alcohol/week: 0.0 standard drinks of alcohol    Current Outpatient Medications:    nystatin-triamcinolone ointment (MYCOLOG), Apply 1 Application topically 2 (two) times daily., Disp: 30 g, Rfl: 0   albuterol (PROVENTIL) (2.5 MG/3ML) 0.083% nebulizer solution, Take 3 mLs (2.5 mg total) by nebulization every 6 (six) hours as needed for  wheezing or shortness of breath., Disp: 75 mL, Rfl: 1   ascorbic acid (VITAMIN C) 500 MG tablet, Take 500 mg by mouth daily., Disp: , Rfl:    atomoxetine (STRATTERA) 60 MG capsule, Take 1 capsule (60 mg total) by mouth  daily., Disp: 30 capsule, Rfl: 2   Azelastine-Fluticasone 137-50 MCG/ACT SUSP, Place 1 spray into both nostrils 2 (two) times daily., Disp: 23 g, Rfl: 5   baclofen (LIORESAL) 10 MG tablet, Take by mouth., Disp: , Rfl:    bisoprolol (ZEBETA) 10 MG tablet, Take 1 tablet (10 mg total) by mouth daily., Disp: 90 tablet, Rfl: 1   budesonide-formoterol (SYMBICORT) 160-4.5 MCG/ACT inhaler, Inhale 2 puffs into the lungs in the morning and at bedtime., Disp: 1 each, Rfl: 5   busPIRone (BUSPAR) 10 MG tablet, Take 1 tablet (10 mg total) by mouth 2 (two) times daily., Disp: 60 tablet, Rfl: 2   calcium carbonate (OS-CAL) 1250 (500 Ca) MG chewable tablet, Chew by mouth., Disp: , Rfl:    cariprazine (VRAYLAR) 1.5 MG capsule, Take 1 capsule (1.5 mg total) by mouth daily., Disp: 30 capsule, Rfl: 2   Cholecalciferol 25 MCG (1000 UT) tablet, Take 1 Units by mouth daily at 2 PM., Disp: , Rfl:    clobetasol (TEMOVATE) 0.05 % external solution, APPLY TO AFFECTED AREA TWICE A DAY, Disp: 50 mL, Rfl: 3   clonazePAM (KLONOPIN) 0.5 MG tablet, Take 1 tablet (0.5 mg total) by mouth 2 (two) times daily as needed for anxiety., Disp: 60 tablet, Rfl: 2   diazepam (VALIUM) 5 MG tablet, Diazepam 5   Take 30-60 minutes prior to procedure, will need ride to and form procedure. Can repeat x 1. (Patient not taking: Reported on 03/06/2023), Disp: , Rfl:    EPINEPHrine 0.3 mg/0.3 mL IJ SOAJ injection, Inject 0.3 mg into the muscle as needed for anaphylaxis., Disp: 2 each, Rfl: 2   fexofenadine (ALLEGRA) 180 MG tablet, Take 1 tablet (180 mg total) by mouth daily., Disp: 30 tablet, Rfl: 5   Galcanezumab-gnlm 120 MG/ML SOAJ, Inject into the skin. Emgality, Disp: , Rfl:    hydrOXYzine (VISTARIL) 25 MG capsule, Take 1-2 capsules (25-50 mg total) by mouth daily as needed., Disp: 60 capsule, Rfl: 2   LINZESS 72 MCG capsule, Take 72 mcg by mouth daily., Disp: , Rfl:    Loratadine (CLARITIN PO), Take by mouth., Disp: , Rfl:    metoprolol tartrate  (LOPRESSOR) 25 MG tablet, Take 1 tablet (25 mg total) by mouth as needed., Disp: 15 tablet, Rfl: 1   montelukast (SINGULAIR) 10 MG tablet, Take 1 tablet (10 mg total) by mouth daily., Disp: 30 tablet, Rfl: 5   Multiple Vitamin (MULTIVITAMIN) capsule, Take by mouth., Disp: , Rfl:    norethindrone (MICRONOR) 0.35 MG tablet, Take 1 tablet (0.35 mg total) by mouth daily., Disp: 28 tablet, Rfl: 11   Olopatadine HCl 0.2 % SOLN, Apply 1 drop to eye every morning., Disp: , Rfl:    ondansetron (ZOFRAN-ODT) 4 MG disintegrating tablet, DISSOLVE ON TONGUE AT ONSET OF NAUSEA- 30 DAY SUPPLY, Disp: 10 tablet, Rfl: 1   pantoprazole (PROTONIX) 40 MG tablet, Take 1 tablet by mouth in the morning and at bedtime., Disp: , Rfl:    RESTASIS 0.05 % ophthalmic emulsion, 1 drop 2 (two) times daily., Disp: , Rfl:    sertraline (ZOLOFT) 50 MG tablet, Take 1.5 tablets (75 mg total) by mouth daily., Disp: 45 tablet, Rfl: 2   SUMAtriptan (IMITREX) 100 MG tablet, Take  by mouth., Disp: , Rfl:    topiramate (TOPAMAX) 25 MG tablet, Take 1 tablet nightly for 1 week, then 1 tablet twice daily for 1 week, then take 1 tablet in the am and 2 tablets at bedtime for 1 week, then take 2 tablet by mouth twice daily., Disp: , Rfl:    VENTOLIN HFA 108 (90 Base) MCG/ACT inhaler, INHALE 2 PUFFS INTO THE LUNGS EVERY 4 HOURS AS NEEDED FOR WHEEZING OR SHORTNESS OF BREATH., Disp: 18 each, Rfl: 0  Allergies  Allergen Reactions   Glucosamine Forte [Nutritional Supplements] Anaphylaxis    Due to containing shellfish   Nutritional Supplements Anaphylaxis   Oyster Extract Anaphylaxis   Shellfish-Derived Products Anaphylaxis    Throat swelling   Cats Claw (Uncaria Tomentosa) Nausea And Vomiting   Diamox [Acetazolamide] Other (See Comments)    Numbness- feet, hands, lips   Lactose    Lactose Intolerance (Gi)    Topiramate Other (See Comments)    Numbness to face, hands and feet   Bromfed Dm [Pseudoeph-Bromphen-Dm] Anxiety    CARBOFED DM     Objective:   There were no vitals taken for this visit.  Patient is well-developed, well-nourished in no acute distress.  Resting comfortably at home.  Head is normocephalic, atraumatic.  No labored breathing.  Speech is clear and coherent with logical content.  Patient is alert and oriented at baseline.  Images from MyChart reviewed, beefy red rash with satellite lesions present under both axilla.   Assessment and Plan:   Scout was seen today for rash.  Diagnoses and all orders for this visit:  Yeast dermatitis Classic yeast dermatitis. Will treat with below. Pt aware of proper use and hygiene. Report new, worsening, or persistent symptoms.  -     nystatin-triamcinolone ointment (MYCOLOG); Apply 1 Application topically 2 (two) times daily.      Return if symptoms worsen or fail to improve.  Kari Baars, FNP-C [facility] [address] [place], [place] [address] [phone]  03/14/2023  Time spent with the patient: 12 minutes, of which >50% was spent in obtaining information about symptoms, reviewing previous labs, evaluations, and treatments, counseling about condition (please see the discussed topics above), and developing a plan to further investigate it; had a number of questions which I addressed.

## 2023-03-19 ENCOUNTER — Encounter: Payer: Medicaid Other | Admitting: Pulmonary Disease

## 2023-03-21 ENCOUNTER — Encounter: Payer: Self-pay | Admitting: Allergy & Immunology

## 2023-03-29 ENCOUNTER — Encounter: Payer: Self-pay | Admitting: "Endocrinology

## 2023-03-29 ENCOUNTER — Encounter (HOSPITAL_BASED_OUTPATIENT_CLINIC_OR_DEPARTMENT_OTHER): Payer: Self-pay | Admitting: Cardiovascular Disease

## 2023-03-29 ENCOUNTER — Ambulatory Visit (INDEPENDENT_AMBULATORY_CARE_PROVIDER_SITE_OTHER): Payer: Medicaid Other | Admitting: Cardiovascular Disease

## 2023-03-29 VITALS — BP 112/73 | HR 96 | Ht 59.0 in | Wt 241.5 lb

## 2023-03-29 DIAGNOSIS — G4733 Obstructive sleep apnea (adult) (pediatric): Secondary | ICD-10-CM

## 2023-03-29 DIAGNOSIS — I10 Essential (primary) hypertension: Secondary | ICD-10-CM | POA: Diagnosis not present

## 2023-03-29 DIAGNOSIS — Q211 Atrial septal defect, unspecified: Secondary | ICD-10-CM

## 2023-03-29 NOTE — Patient Instructions (Addendum)
Medication Instructions:  Your physician recommends that you continue on your current medications as directed. Please refer to the Current Medication list given to you today.   Labwork: NONE  Testing/Procedures: NONE  Follow-Up: 06/28/2023 11:20 am with Ronn Melena NP  If you need a refill on your cardiac medications before your next appointment, please call your pharmacy.

## 2023-03-29 NOTE — Assessment & Plan Note (Signed)
Stable s/p ASD repair.

## 2023-03-29 NOTE — Progress Notes (Deleted)
Advanced Hypertension Clinic Initial Assessment:    Date:  03/29/2023   ID:  Molly [name], DOB 09-Dec-2000, MRN [id number]  PCP:  Molly [name]  Cardiologist:  None  Nephrologist:  Referring MD: Molly [name]   CC: Hypertension  History of Present Illness:    Molly [name] is a 22 y.o. female with a hx of ASD s/p repair 2012, POTS, hypertension, hyperlipidemia, asthma, migraines, gastritis, hiatal hernia, scoliosis, obesity, ADHD, PTSD, anxiety, depression, and suicidal ideation, here to establish care in the Advanced Hypertension Clinic. She previously saw Molly [name] for palpitations and intermittent attacks of diaphoresis with shaking and cold hands, but feeling like she was flushed. Metanephrines were ordered but not performed. Thyroid function was normal. She wore a monitor 10/2022 that showed an average heart rate of 109 with rare PACs and PVCs. Symptomatic fluttering was associated with sinus tachycardia. Her blood pressure in the office with him was 184/81. Her palpitations and blood pressure improved with switching metoprolol to bisoprolol. She was referred to Advanced Hypertension Clinic.  She was initially followed for hypertension at an early age following her ASD repair.  Her BP was labile ranging 110s-150s systolic on average. Her readings improved after starting bisoprolol. Her heart rate is typically elevated in the 100s-120s, monitored by her pulse ox and smart watch. In the past her heart rate has been as high as the 180s. She has been struggling with intermittent "attacks" worse since 2021 that are associated with diaphoresis, elevated heart rates, palpitations that feel like her heart will come out of her chest, dizziness, and imbalance.  For a few years she has been on Strattera, which she takes at nighttime as she reported it caused drowsiness. At 22 yo her pediatrician diagnosed her with POTS. Her symptoms included palpitations, chest pain, dizziness,  and shortness of breath. In middle school, she had a stress test that she was unable to complete. She also had prior workup for Cushing's in 2017-2018. In 2019 she went on antidepressants and gained more than 50 lbs. Prior to that her weight was in the 160s.   She followed up with Molly [name] 12/2022 and continued having spells approximately once per week.  He question whether she may have mastocytosis.  She followed up with Molly Shields, NP 12/2022 and she reported more frequent episodes.  Since increasing her bisoprolol her heart rates increased to the 130s instead of the 180s.  Blood pressures at home remained uncontrolled.  Labs were negative for Cushing syndrome.  24-hour urine normetanephrine was mildly elevated at 451.  Metanephrines were normal.  However CT of the abdomen was negative for adrenal nodules.  It did note some likely reactive lymph nodes.  She was referred to endocrine.  Echo 12/2022 revealed LVEF 55 to 60% with mild LVH and no residual shunting across her ASD repair.  Her PCP recently diagnosed her with yeast dermatitis.  She has been followed by allergy and immunology due to low IgG but otherwise normal immune workup.  They ordered IgG and IgG subclasses.  They also referred her to endocrine and placed an order for sleep study.  Previous antihypertensives: N/a  Past Medical History:  Diagnosis Date   Acid reflux 2010   Acne vulgaris 2017   ADHD (attention deficit hyperactivity disorder) 2011   Adverse food reaction 07/11/2016   Shellfish allergy   Amenorrhea 2019   Anaphylactic shock due to adverse food reaction 01/15/2018   Anxiety 2017   ASD (atrial  septal defect) 11/22/2022   Chronic constipation 09/05/2011   Chronic tension headaches 2011   originally followed by [facility] MollyNichols. Then Dr Molly [name] [facility] 03/2020 (see above)    Coalition, calcaneus navicular    Dysmenorrhea    Eczema    Eustachian tube dysfunction, left 10/23/2019   Frequent headaches     Galactorrhea    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 04/01/2019   Hiatal hernia    History of atrial septal defect repair 12/04/2011   Hyperlipidemia 2013   Hypertension 2011   Hypertension 09/28/2020   hypertension, cellulitus    Idiopathic urticaria 09/05/2011   Insomnia    Keratosis pilaris 01/15/2018   Mass of left thigh 11/22/2017   Overview:  Added automatically from request for surgery [id number]   MDD (major depressive disorder), severe 03/06/2018   Migraine 2011   re-diagnosed as chronic migraines 03/2020 [facility] - starting on preventative    Obesity without serious comorbidity with body mass index (BMI) in 95th to 98th percentile for age in pediatric patient 09/04/2018   Piriformis syndrome    POTS (postural orthostatic tachycardia syndrome)    PTSD (post-traumatic stress disorder)    Scoliosis 2019   Seasonal and perennial allergic rhinitis 2010   Severe persistent asthma without complication 2010   Sleep paralysis    Suicidal ideation 03/07/2018   Tic disorder 2019    Past Surgical History:  Procedure Laterality Date   ASD REPAIR  2012   with Helix, via Cardiac Catheterization   atrial septal defecgt     CARDIAC SURGERY     DENTAL SURGERY  2020   lipoma removal  2019   LUMBAR PUNCTURE  05/20/2021   TONSILLECTOMY AND ADENOIDECTOMY  2010    Current Medications: No outpatient medications have been marked as taking for the 03/29/23 encounter (Appointment) with Chilton Si, MD.     Allergies:   Glucosamine forte [nutritional supplements], Nutritional supplements, Oyster extract, Shellfish-derived products, Cats claw (uncaria tomentosa), Diamox [acetazolamide], Lactose, Lactose intolerance (gi), Topiramate, and Bromfed dm [pseudoeph-bromphen-dm]   Social History   Socioeconomic History   Marital status: Single    Spouse name: Not on file   Number of children: Not on file   Years of education: Not on file   Highest education level: Not on file  Occupational  History   Not on file  Tobacco Use   Smoking status: Never   Smokeless tobacco: Never  Vaping Use   Vaping Use: Never used  Substance and Sexual Activity   Alcohol use: No    Alcohol/week: 0.0 standard drinks of alcohol   Drug use: No   Sexual activity: Never    Birth control/protection: Pill  Other Topics Concern   Not on file  Social History Narrative   Not on file   Social Determinants of Health   Financial Resource Strain: Low Risk  (03/06/2023)   Overall Financial Resource Strain (CARDIA)    Difficulty of Paying Living Expenses: Not hard at all  Food Insecurity: No Food Insecurity (03/06/2023)   Hunger Vital Sign    Worried About Running Out of Food in the Last Year: Never true    Ran Out of Food in the Last Year: Never true  Transportation Needs: No Transportation Needs (03/06/2023)   PRAPARE - Administrator, Civil Service (Medical): No    Lack of Transportation (Non-Medical): No  Physical Activity: Inactive (03/06/2023)   Exercise Vital Sign    Days of Exercise per Week: 0  days    Minutes of Exercise per Session: 0 min  Stress: No Stress Concern Present (03/06/2023)   [employer] of Occupational Health - Occupational Stress Questionnaire    Feeling of Stress : Only a little  Social Connections: Socially Isolated (03/06/2023)   Social Connection and Isolation Panel [NHANES]    Frequency of Communication with Friends and Family: Three times a week    Frequency of Social Gatherings with Friends and Family: Once a week    Attends Religious Services: Never    Database administrator or Organizations: No    Attends Engineer, structural: Never    Marital Status: Never married     Family History: The patient's family history includes ADD / ADHD in her brother and father; Alcohol abuse in her father; Allergic rhinitis in her brother and mother; Anxiety disorder in her mother; Arthritis in her mother; Asthma in her brother and mother; Bipolar disorder in  her father, maternal grandfather, and mother; Depression in her mother; Diabetes in her mother; Drug abuse in her father; Heart attack in her maternal grandfather; Heart disease in her maternal grandmother; Hyperlipidemia in her maternal grandmother and mother; Hypertension in her maternal grandmother and mother; Learning disabilities in her brother; Mental illness in her father and mother; Miscarriages / [place] in her mother; Thyroid disease in her mother. There is no history of Angioedema, Eczema, Immunodeficiency, or Urticaria.  ROS:   Please see the history of present illness.    (+) Diaphoresis (+) Elevated heart rates (+) Palpitations (+) Dizziness (+) Imbalance (+) Myalgias (+) Migraines (+) Presyncope with tunnel vision and shaking (+) Acid reflux (+) Snoring (+) Edema of bilateral hands All other systems reviewed and are negative.  EKGs/Labs/Other Studies Reviewed:    Monitor 11/2022: Indication:palpitations    Duration: 5d   Findings HR  avg 109  Min 57-Max 152  PVCs Rare, less than 1%   PACs Rare, less than 1%     Symptoms:             fluttering>> sinus tach     Triggered: sinus tach              Conclusions: No significant arrhythmias There were different vector P waves of the QRS morphologies but heart rates for the same See 6: 50: 44 strip versus 8: 14: 33 strip both on 11/9   Recommendations No significant arrhythmias.  Symptoms were associated with sinus tachycardia P wave morphology changes not suggestive of a primary arrhythmia    EKG:  EKG is personally reviewed. 11/22/2022: Sinus tachycardia. Rate 104 bpm.  Recent Labs: 10/11/2022: ALT 34; BUN 5; Creatinine, Ser 0.57; Hemoglobin 13.6; Platelets 286; Potassium 4.0; Sodium 142   Recent Lipid Panel    Component Value Date/Time   CHOL 191 (H) 10/11/2022 1556   TRIG 188 (H) 10/11/2022 1556   HDL 37 (L) 10/11/2022 1556   CHOLHDL 5.2 (H) 10/11/2022 1556   CHOLHDL 3.3 03/07/2018 0713   VLDL  20 03/07/2018 0713   LDLCALC 121 (H) 10/11/2022 1556    Physical Exam:    VS:  There were no vitals taken for this visit. , BMI There is no height or weight on file to calculate BMI. GENERAL:  Well appearing HEENT: Pupils equal round and reactive, fundi not visualized, oral mucosa unremarkable.  Round face NECK:  No jugular venous distention, waveform within normal limits, carotid upstroke brisk and symmetric, no bruits, no thyromegaly LUNGS:  Clear to auscultation bilaterally HEART:  RRR.  PMI not displaced or sustained,S1 and S2 within normal limits, no S3, no S4, no clicks, no rubs, no murmurs ABD:  Flat, positive bowel sounds normal in frequency in pitch, no bruits, no rebound, no guarding, no midline pulsatile mass, no hepatomegaly, no splenomegaly EXT:  2 plus pulses throughout, no edema, no cyanosis, no clubbing.  +hump between scapula SKIN:  No rashes, no nodules NEURO:  Cranial nerves II through XII grossly intact, motor grossly intact throughout PSYCH:  Cognitively intact, oriented to person place and time   ASSESSMENT/PLAN:    No problem-specific Assessment & Plan notes found for this encounter.    Screening for Secondary Hypertension:     11/22/2022    3:55 PM 12/31/2022    8:11 PM  Causes  Drugs/Herbals Screened      - Comments No caffeine or energy drinks.   Sleep Apnea  Screened     - Comments  pending workup with pulmonology  Thyroid Disease  Screened     - Comments  normal TSH 10/2022  Pheochromocytoma  Screened     - Comments  12/2022 CT no adrenal nodules  Cushing's Syndrome  Screened     - Comments  11/2022 normal cortisol  Compliance  Screened    Relevant Labs/Studies:    Latest Ref Rng & Units 10/11/2022    3:56 PM 03/06/2022   11:02 AM 11/26/2019    9:46 AM  Basic Labs  Sodium 134 - 144 mmol/L 142  143  146   Potassium 3.5 - 5.2 mmol/L 4.0  4.5  4.2   Creatinine 0.57 - 1.00 mg/dL 8.11  9.14  7.82        Latest Ref Rng & Units 03/06/2022   11:02  AM 03/07/2018    7:13 AM  Thyroid   TSH 0.450 - 4.500 uIU/mL 1.550  2.434    Disposition:    FU with APP/PharmD in 1 month for the next 3 months.   FU with Myking Sar C. Duke Salvia, MD, [facility] in 4 months.  Medication Adjustments/Labs and Tests Ordered: Current medicines are reviewed at length with the patient today.  Concerns regarding medicines are outlined above.   No orders of the defined types were placed in this encounter.  No orders of the defined types were placed in this encounter.   I,Mathew Stumpf,acting as a Neurosurgeon for Chilton Si, MD.,have documented all relevant documentation on the behalf of Chilton Si, MD,as directed by  Chilton Si, MD while in the presence of Chilton Si, MD.  I, Hadja Harral C. Duke Salvia, MD have reviewed all documentation for this visit.  The documentation of the exam, diagnosis, procedures, and orders on 03/29/2023 are all accurate and complete.   Signed, Chilton Si, MD  03/29/2023 7:51 AM    [facility]

## 2023-03-29 NOTE — Progress Notes (Signed)
[facility] Follow-up:    Date:  03/29/2023   ID:  Molly [name], DOB 04/23/2001, MRN [id number]  PCP:  Sonny Masters, FNP  Cardiologist:  None  Nephrologist:  Referring MD: Sonny Masters, FNP   CC: Hypertension  History of Present Illness:    Molly [name] is a 22 y.o. female with a hx of ASD s/p repair 2012, POTS, hypertension, hyperlipidemia, asthma, migraines, gastritis, hiatal hernia, scoliosis, obesity, ADHD, PTSD, anxiety, depression, and suicidal ideation, here for follow-up. She was initially seen 11/22/2022 in the [facility]. She previously saw Dr. Graciela Husbands for palpitations and intermittent attacks of diaphoresis with shaking and cold hands, but feeling like she was flushed. Metanephrines were ordered but not performed. Thyroid function was normal. She wore a monitor 10/2022 that showed an average heart rate of 109 with rare PACs and PVCs. Symptomatic fluttering was associated with sinus tachycardia. Her blood pressure in the office with him was 184/81. Her palpitations and blood pressure improved with switching metoprolol to bisoprolol. She was referred to [facility].  She was initially followed for hypertension at an early age following her ASD repair.  Her BP was labile ranging 110s-150s systolic on average. Her readings improved after starting bisoprolol. Her heart rate is typically elevated in the 100s-120s, monitored by her pulse ox and smart watch. In the past her heart rate has been as high as the 180s. She has been struggling with intermittent "attacks" worse since 2021 that are associated with diaphoresis, elevated heart rates, palpitations that feel like her heart will come out of her chest, dizziness, and imbalance.  For a few years she has been on Strattera, which she takes at nighttime as she reported it caused drowsiness. At 22 yo her pediatrician diagnosed her with POTS. Her symptoms included palpitations,  chest pain, dizziness, and shortness of breath. In middle school, she had a stress test that she was unable to complete. She also had prior workup for Cushing's in 2017-2018. In 2019 she went on antidepressants and gained more than 50 lbs. Prior to that her weight was in the 160s.   She followed up with Dr. Graciela Husbands 12/2022 and continued having spells approximately once per week.  He questioned whether she may have mastocytosis.  She followed up with Gillian Shields, NP 12/2022 and she reported more frequent episodes.  Since increasing her bisoprolol her heart rates increased to the 130s instead of the 180s.  Blood pressures at home remained uncontrolled.  Labs were negative for Cushing syndrome.  24-hour urine normetanephrine was mildly elevated at 451.  Metanephrines were normal.  However CT of the abdomen was negative for adrenal nodules.  It did note some likely reactive lymph nodes.  She was referred to endocrine.  Echo 12/2022 revealed LVEF 55 to 60% with mild LVH and no residual shunting across her ASD repair.  Her PCP recently diagnosed her with yeast dermatitis.  She has been followed by allergy and immunology due to low IgG but otherwise normal immune workup.  They ordered IgG and IgG subclasses.  They also referred her to endocrine and placed an order for sleep study.  Today, she is accompanied by a family member. Currently she is still having a few episodes but they are not as severe. She has seen a new gynecologist and her birth control was tapered down (she takes it for 28 days per month). Since then her diaphoresis has improved significantly. Also she continues to experience orthostatic symptoms,  especially becoming lightheaded when standing up. She is standing for longer times, and she doesn't have to lie down as much due to less frequent "attacks." Her blood pressure is well controlled in clinic today. Lately her heart rate hasn't been as high as it used to be, frequently in the 90's; she has set her  watch to trigger above 100 bpm. These alerts seem to occur randomly. Recently the quality of her breathing had declined for a time. It has been about a week since her Symbicort was increased to 2 puffs in the AM and PM. Her family also was instructed to begin using Alluna home spirometry. Currently she feels her breathing is stable except when she is trying to force her breath with the spirometer. She complains of occasional numbness and tingling sensations of her left mid shin to the foot. She does endorse a known sciatic nerve issue. She will have an ultrasound to check for PCOS. In June she is scheduled to see an endocrinologist. Will schedule a sleep study this summer. Financial planner study. She also plans to be fitted for compression stockings. She denies any palpitations, chest pain, headaches, syncope, orthopnea, or PND.  Previous antihypertensives: N/a  Past Medical History:  Diagnosis Date   Acid reflux 2010   Acne vulgaris 2017   ADHD (attention deficit hyperactivity disorder) 2011   Adverse food reaction 07/11/2016   Shellfish allergy   Amenorrhea 2019   Anaphylactic shock due to adverse food reaction 01/15/2018   Anxiety 2017   ASD (atrial septal defect) 11/22/2022   Chronic constipation 09/05/2011   Chronic tension headaches 2011   originally followed by [facility] K.Griffin. Then Dr Alexis Goodell [facility] 03/2020 (see above)    Coalition, calcaneus navicular    Dysmenorrhea    Eczema    Eustachian tube dysfunction, left 10/23/2019   Frequent headaches    Galactorrhea    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 04/01/2019   Hiatal hernia    History of atrial septal defect repair 12/04/2011   Hyperlipidemia 2013   Hypertension 2011   Hypertension 09/28/2020   hypertension, cellulitus    Idiopathic urticaria 09/05/2011   Insomnia    Keratosis pilaris 01/15/2018   Mass of left thigh 11/22/2017   Overview:  Added automatically from request for surgery [id number]   MDD (major  depressive disorder), severe 03/06/2018   Migraine 2011   re-diagnosed as chronic migraines 03/2020 [facility] - starting on preventative    Obesity without serious comorbidity with body mass index (BMI) in 95th to 98th percentile for age in pediatric patient 09/04/2018   Piriformis syndrome    POTS (postural orthostatic tachycardia syndrome)    PTSD (post-traumatic stress disorder)    Scoliosis 2019   Seasonal and perennial allergic rhinitis 2010   Severe persistent asthma without complication 2010   Sleep paralysis    Suicidal ideation 03/07/2018   Tic disorder 2019    Past Surgical History:  Procedure Laterality Date   ASD REPAIR  2012   with Helix, via Cardiac Catheterization   atrial septal defecgt     CARDIAC SURGERY     DENTAL SURGERY  2020   lipoma removal  2019   LUMBAR PUNCTURE  05/20/2021   TONSILLECTOMY AND ADENOIDECTOMY  2010    Current Medications: Current Meds  Medication Sig   albuterol (PROVENTIL) (2.5 MG/3ML) 0.083% nebulizer solution Take 3 mLs (2.5 mg total) by nebulization every 6 (six) hours as needed for wheezing or shortness of breath.  ascorbic acid (VITAMIN C) 500 MG tablet Take 500 mg by mouth daily.   atomoxetine (STRATTERA) 60 MG capsule Take 1 capsule (60 mg total) by mouth daily.   Azelastine-Fluticasone 137-50 MCG/ACT SUSP Place 1 spray into both nostrils 2 (two) times daily.   bisoprolol (ZEBETA) 10 MG tablet Take 1 tablet (10 mg total) by mouth daily.   budesonide-formoterol (SYMBICORT) 160-4.5 MCG/ACT inhaler Inhale 2 puffs into the lungs in the morning and at bedtime.   busPIRone (BUSPAR) 10 MG tablet Take 1 tablet (10 mg total) by mouth 2 (two) times daily.   calcium carbonate (OS-CAL) 1250 (500 Ca) MG chewable tablet Chew by mouth.   cariprazine (VRAYLAR) 1.5 MG capsule Take 1 capsule (1.5 mg total) by mouth daily.   Cholecalciferol 25 MCG (1000 UT) tablet Take 1 Units by mouth daily at 2 PM.   clobetasol (TEMOVATE) 0.05 % external solution  APPLY TO AFFECTED AREA TWICE A DAY   clonazePAM (KLONOPIN) 0.5 MG tablet Take 1 tablet (0.5 mg total) by mouth 2 (two) times daily as needed for anxiety.   diazepam (VALIUM) 5 MG tablet    dicyclomine (BENTYL) 10 MG capsule Take 10 mg by mouth as needed for spasms.   EPINEPHrine 0.3 mg/0.3 mL IJ SOAJ injection Inject 0.3 mg into the muscle as needed for anaphylaxis.   fexofenadine (ALLEGRA) 180 MG tablet Take 1 tablet (180 mg total) by mouth daily.   Galcanezumab-gnlm 120 MG/ML SOAJ Inject into the skin. Emgality   hydrOXYzine (VISTARIL) 25 MG capsule Take 1-2 capsules (25-50 mg total) by mouth daily as needed.   LINZESS 72 MCG capsule Take 72 mcg by mouth daily.   Loratadine (CLARITIN PO) Take by mouth.   metoprolol tartrate (LOPRESSOR) 25 MG tablet Take 1 tablet (25 mg total) by mouth as needed.   montelukast (SINGULAIR) 10 MG tablet Take 1 tablet (10 mg total) by mouth daily.   Multiple Vitamin (MULTIVITAMIN) capsule Take by mouth.   norethindrone (MICRONOR) 0.35 MG tablet Take 1 tablet (0.35 mg total) by mouth daily.   nystatin-triamcinolone ointment (MYCOLOG) Apply 1 Application topically 2 (two) times daily.   Olopatadine HCl 0.2 % SOLN Apply 1 drop to eye every morning.   ondansetron (ZOFRAN-ODT) 4 MG disintegrating tablet DISSOLVE ON TONGUE AT ONSET OF NAUSEA- 30 DAY SUPPLY   pantoprazole (PROTONIX) 40 MG tablet Take 1 tablet by mouth in the morning and at bedtime.   RESTASIS 0.05 % ophthalmic emulsion 1 drop 2 (two) times daily.   sertraline (ZOLOFT) 50 MG tablet Take 1.5 tablets (75 mg total) by mouth daily.   SUMAtriptan (IMITREX) 100 MG tablet Take by mouth.   topiramate (TOPAMAX) 25 MG tablet Take 1 tablet nightly for 1 week, then 1 tablet twice daily for 1 week, then take 1 tablet in the am and 2 tablets at bedtime for 1 week, then take 2 tablet by mouth twice daily.   VENTOLIN HFA 108 (90 Base) MCG/ACT inhaler INHALE 2 PUFFS INTO THE LUNGS EVERY 4 HOURS AS NEEDED FOR WHEEZING OR  SHORTNESS OF BREATH.     Allergies:   Glucosamine forte [nutritional supplements], Nutritional supplements, Oyster extract, Shellfish-derived products, Cats claw (uncaria tomentosa), Diamox [acetazolamide], Lactose, Lactose intolerance (gi), Topiramate, and Bromfed dm [pseudoeph-bromphen-dm]   Social History   Socioeconomic History   Marital status: Single    Spouse name: Not on file   Number of children: Not on file   Years of education: Not on file   Highest education level:  Not on file  Occupational History   Not on file  Tobacco Use   Smoking status: Never   Smokeless tobacco: Never  Vaping Use   Vaping Use: Never used  Substance and Sexual Activity   Alcohol use: No    Alcohol/week: 0.0 standard drinks of alcohol   Drug use: No   Sexual activity: Never    Birth control/protection: Pill  Other Topics Concern   Not on file  Social History Narrative   Not on file   Social Determinants of Health   Financial Resource Strain: Low Risk  (03/06/2023)   Overall Financial Resource Strain (CARDIA)    Difficulty of Paying Living Expenses: Not hard at all  Food Insecurity: No Food Insecurity (03/06/2023)   Hunger Vital Sign    Worried About Running Out of Food in the Last Year: Never true    Ran Out of Food in the Last Year: Never true  Transportation Needs: No Transportation Needs (03/06/2023)   PRAPARE - Administrator, Civil Service (Medical): No    Lack of Transportation (Non-Medical): No  Physical Activity: Inactive (03/06/2023)   Exercise Vital Sign    Days of Exercise per Week: 0 days    Minutes of Exercise per Session: 0 min  Stress: No Stress Concern Present (03/06/2023)   [employer] of Occupational Health - Occupational Stress Questionnaire    Feeling of Stress : Only a little  Social Connections: Socially Isolated (03/06/2023)   Social Connection and Isolation Panel [NHANES]    Frequency of Communication with Friends and Family: Three times a week     Frequency of Social Gatherings with Friends and Family: Once a week    Attends Religious Services: Never    Database administrator or Organizations: No    Attends Engineer, structural: Never    Marital Status: Never married     Family History: The patient's family history includes ADD / ADHD in her brother and father; Alcohol abuse in her father; Allergic rhinitis in her brother and mother; Anxiety disorder in her mother; Arthritis in her mother; Asthma in her brother and mother; Bipolar disorder in her father, maternal grandfather, and mother; Depression in her mother; Diabetes in her mother; Drug abuse in her father; Heart attack in her maternal grandfather; Heart disease in her maternal grandmother; Hyperlipidemia in her maternal grandmother and mother; Hypertension in her maternal grandmother and mother; Learning disabilities in her brother; Mental illness in her father and mother; Miscarriages / [place] in her mother; Thyroid disease in her mother. There is no history of Angioedema, Eczema, Immunodeficiency, or Urticaria.  ROS:   Please see the history of present illness.    (+) Positional lightheadedness (+) LE numbness/tingling sensations All other systems reviewed and are negative.  EKGs/Labs/Other Studies Reviewed:    Echocardiogram  12/29/2022:  1. Left ventricular ejection fraction, by estimation, is 55 to 60%. The  left ventricle has normal function. The left ventricle has no regional  wall motion abnormalities. There is mild concentric left ventricular  hypertrophy. Left ventricular diastolic  function could not be evaluated.   2. Right ventricular systolic function is normal. The right ventricular  size is normal. Tricuspid regurgitation signal is inadequate for assessing  PA pressure.   3. S/p helix repair 2012, device well seated. No interatrial shunt.   4. No evidence of mitral valve regurgitation.   5. The aortic valve was not well visualized. Aortic valve  regurgitation  is not visualized.  6. The inferior vena cava is normal in size with greater than 50%  respiratory variability, suggesting right atrial pressure of 3 mmHg.   Conclusion(s)/Recommendation(s): Normal biventricular function without  evidence of hemodynamically significant valvular heart disease.   Monitor 11/2022: Indication:palpitations    Duration: 5d   Findings HR  avg 109  Min 57-Max 152  PVCs Rare, less than 1%   PACs Rare, less than 1%     Symptoms:             fluttering>> sinus tach     Triggered: sinus tach              Conclusions: No significant arrhythmias There were different vector P waves of the QRS morphologies but heart rates for the same See 6: 50: 44 strip versus 8: 14: 33 strip both on 11/9   Recommendations No significant arrhythmias.  Symptoms were associated with sinus tachycardia P wave morphology changes not suggestive of a primary arrhythmia    EKG:  EKG is personally reviewed. 03/29/2023:  EKG was not ordered. 11/22/2022: Sinus tachycardia. Rate 104 bpm.  Recent Labs: 10/11/2022: ALT 34; BUN 5; Creatinine, Ser 0.57; Hemoglobin 13.6; Platelets 286; Potassium 4.0; Sodium 142   Recent Lipid Panel    Component Value Date/Time   CHOL 191 (H) 10/11/2022 1556   TRIG 188 (H) 10/11/2022 1556   HDL 37 (L) 10/11/2022 1556   CHOLHDL 5.2 (H) 10/11/2022 1556   CHOLHDL 3.3 03/07/2018 0713   VLDL 20 03/07/2018 0713   LDLCALC 121 (H) 10/11/2022 1556    Physical Exam:    VS:  BP 112/73 (BP Location: Left Arm, Patient Position: Sitting, Cuff Size: Large)   Pulse 96   Ht 4\' 11"  (1.499 m)   Wt 241 lb 8 oz (109.5 kg)   SpO2 98%   BMI 48.78 kg/m  , BMI Body mass index is 48.78 kg/m. GENERAL:  Well appearing HEENT: Pupils equal round and reactive, fundi not visualized, oral mucosa unremarkable.  Round face NECK:  No jugular venous distention, waveform within normal limits, carotid upstroke brisk and symmetric, no bruits, no  thyromegaly LUNGS:  Clear to auscultation bilaterally HEART:  RRR.  PMI not displaced or sustained,S1 and S2 within normal limits, no S3, no S4, no clicks, no rubs, no murmurs ABD:  Flat, positive bowel sounds normal in frequency in pitch, no bruits, no rebound, no guarding, no midline pulsatile mass, no hepatomegaly, no splenomegaly EXT:  2 plus pulses throughout, no edema, no cyanosis, no clubbing.   SKIN:  No rashes, no nodules NEURO:  Cranial nerves II through XII grossly intact, motor grossly intact throughout PSYCH:  Cognitively intact, oriented to person place and time   ASSESSMENT/PLAN:    Hypertension Blood pressure is well-controlled on bisoprolol.  She notes worsening of her asthma and wonders if it could be due to bisoprolol.  Continue to monitor for now.  ASD (atrial septal defect) Stable s/p ASD repair.   OSA (obstructive sleep apnea) Sleep study pending this summer.   Screening for Secondary Hypertension:     11/22/2022    3:55 PM 12/31/2022    8:11 PM  Causes  Drugs/Herbals Screened      - Comments No caffeine or energy drinks.   Sleep Apnea  Screened     - Comments  pending workup with pulmonology  Thyroid Disease  Screened     - Comments  normal TSH 10/2022  Pheochromocytoma  Screened     - Comments  12/2022 CT no adrenal nodules  Cushing's Syndrome  Screened     - Comments  11/2022 normal cortisol  Compliance  Screened    Relevant Labs/Studies:    Latest Ref Rng & Units 10/11/2022    3:56 PM 03/06/2022   11:02 AM 11/26/2019    9:46 AM  Basic Labs  Sodium 134 - 144 mmol/L 142  143  146   Potassium 3.5 - 5.2 mmol/L 4.0  4.5  4.2   Creatinine 0.57 - 1.00 mg/dL 1.61  0.96  0.45        Latest Ref Rng & Units 03/06/2022   11:02 AM 03/07/2018    7:13 AM  Thyroid   TSH 0.450 - 4.500 uIU/mL 1.550  2.434    Disposition:    FU with APP/PharmD in 3-4 months.   FU with Denzal Meir C. Duke Salvia, MD, [facility] in 1 year.  Medication Adjustments/Labs and Tests  Ordered: Current medicines are reviewed at length with the patient today.  Concerns regarding medicines are outlined above.   Time spent: 30 minutes-Greater than 50% of this time was spent in counseling, explanation of diagnosis, planning of further management, and coordination of care.   No orders of the defined types were placed in this encounter.  No orders of the defined types were placed in this encounter.  I,Mathew Stumpf,acting as a Neurosurgeon for Chilton Si, MD.,have documented all relevant documentation on the behalf of Chilton Si, MD,as directed by  Chilton Si, MD while in the presence of Chilton Si, MD.  I, Rishik Tubby C. Duke Salvia, MD have reviewed all documentation for this visit.  The documentation of the exam, diagnosis, procedures, and orders on 03/29/2023 are all accurate and complete.  Signed, Chilton Si, MD  03/29/2023 3:18 PM    [facility]

## 2023-03-29 NOTE — Assessment & Plan Note (Signed)
Sleep study pending this summer.

## 2023-03-29 NOTE — Assessment & Plan Note (Signed)
Blood pressure is well-controlled on bisoprolol.  She notes worsening of her asthma and wonders if it could be due to bisoprolol.  Continue to monitor for now.

## 2023-03-30 ENCOUNTER — Other Ambulatory Visit: Payer: Medicaid Other

## 2023-04-03 DIAGNOSIS — J4550 Severe persistent asthma, uncomplicated: Secondary | ICD-10-CM

## 2023-04-06 ENCOUNTER — Other Ambulatory Visit: Payer: Medicaid Other

## 2023-04-17 ENCOUNTER — Encounter: Payer: Self-pay | Admitting: Adult Health

## 2023-04-18 ENCOUNTER — Encounter: Payer: Self-pay | Admitting: Allergy & Immunology

## 2023-04-24 ENCOUNTER — Telehealth (INDEPENDENT_AMBULATORY_CARE_PROVIDER_SITE_OTHER): Payer: Medicaid Other | Admitting: Family Medicine

## 2023-04-24 ENCOUNTER — Encounter: Payer: Self-pay | Admitting: Family Medicine

## 2023-04-24 DIAGNOSIS — L02412 Cutaneous abscess of left axilla: Secondary | ICD-10-CM | POA: Diagnosis not present

## 2023-04-24 MED ORDER — DOXYCYCLINE HYCLATE 100 MG PO TABS
100.0000 mg | ORAL_TABLET | Freq: Two times a day (BID) | ORAL | 0 refills | Status: DC
Start: 2023-04-24 — End: 2023-05-02

## 2023-04-24 MED ORDER — FLUCONAZOLE 150 MG PO TABS
150.0000 mg | ORAL_TABLET | Freq: Once | ORAL | 0 refills | Status: AC
Start: 2023-04-24 — End: 2023-04-24

## 2023-04-24 NOTE — Progress Notes (Signed)
MyChart Video visit  Subjective: CC: abscess PCP: Sonny Masters, FNP ZOX:[id number] Molly [name] is a 22 y.o. female. Patient provides verbal consent for consult held via video.  Due to COVID-19 pandemic this visit was conducted virtually. This visit type was conducted due to national recommendations for restrictions regarding the COVID-19 Pandemic (e.g. social distancing, sheltering in place) in an effort to limit this patient's exposure and mitigate transmission in our community. All issues noted in this document were discussed and addressed.  A physical exam was not performed with this format.   Location of patient: home Location of provider: [facility] Others present for call: mom  1. ?Abscess She reports lesion came up under her left arm about 2 weeks ago after shaving.  She is not sure if she had a skin tag and perhaps that is why it developed.  It seems to be getting worse.  She notes that it hurts when she raises her right upper extremity.  It is actively draining.  She reports no fevers.  Is not sure if she feels a mass in the axillary region are not.   ROS: Per HPI  Allergies  Allergen Reactions   Glucosamine Forte [Nutritional Supplements] Anaphylaxis    Due to containing shellfish   Nutritional Supplements Anaphylaxis   Oyster Extract Anaphylaxis   Shellfish-Derived Products Anaphylaxis    Throat swelling   Cats Claw (Uncaria Tomentosa) Nausea And Vomiting   Diamox [Acetazolamide] Other (See Comments)    Numbness- feet, hands, lips   Lactose    Lactose Intolerance (Gi)    Topiramate Other (See Comments)    Numbness to face, hands and feet   Bromfed Dm [Pseudoeph-Bromphen-Dm] Anxiety    CARBOFED DM   Past Medical History:  Diagnosis Date   Acid reflux 2010   Acne vulgaris 2017   ADHD (attention deficit hyperactivity disorder) 2011   Adverse food reaction 07/11/2016   Shellfish allergy   Amenorrhea 2019   Anaphylactic shock due to adverse food reaction 01/15/2018    Anxiety 2017   ASD (atrial septal defect) 11/22/2022   Chronic constipation 09/05/2011   Chronic tension headaches 2011   originally followed by [facility] K.Griffin. Then Dr Alexis Goodell [facility] 03/2020 (see above)    Coalition, calcaneus navicular    Dysmenorrhea    Eczema    Eustachian tube dysfunction, left 10/23/2019   Frequent headaches    Galactorrhea    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 04/01/2019   Hiatal hernia    History of atrial septal defect repair 12/04/2011   Hyperlipidemia 2013   Hypertension 2011   Hypertension 09/28/2020   hypertension, cellulitus    Idiopathic urticaria 09/05/2011   Insomnia    Keratosis pilaris 01/15/2018   Mass of left thigh 11/22/2017   Overview:  Added automatically from request for surgery [id number]   MDD (major depressive disorder), severe (HCC) 03/06/2018   Migraine 2011   re-diagnosed as chronic migraines 03/2020 [facility] - starting on preventative    Obesity without serious comorbidity with body mass index (BMI) in 95th to 98th percentile for age in pediatric patient 09/04/2018   Piriformis syndrome    POTS (postural orthostatic tachycardia syndrome)    PTSD (post-traumatic stress disorder)    Scoliosis 2019   Seasonal and perennial allergic rhinitis 2010   Severe persistent asthma without complication 2010   Sleep paralysis    Suicidal ideation 03/07/2018   Tic disorder 2019    Current Outpatient Medications:    albuterol (PROVENTIL) (2.5 MG/3ML)  0.083% nebulizer solution, Take 3 mLs (2.5 mg total) by nebulization every 6 (six) hours as needed for wheezing or shortness of breath., Disp: 75 mL, Rfl: 1   ascorbic acid (VITAMIN C) 500 MG tablet, Take 500 mg by mouth daily., Disp: , Rfl:    atomoxetine (STRATTERA) 60 MG capsule, Take 1 capsule (60 mg total) by mouth daily., Disp: 30 capsule, Rfl: 2   Azelastine-Fluticasone 137-50 MCG/ACT SUSP, Place 1 spray into both nostrils 2 (two) times daily., Disp: 23 g, Rfl: 5   bisoprolol (ZEBETA)  10 MG tablet, Take 1 tablet (10 mg total) by mouth daily., Disp: 90 tablet, Rfl: 1   budesonide-formoterol (SYMBICORT) 160-4.5 MCG/ACT inhaler, Inhale 2 puffs into the lungs in the morning and at bedtime., Disp: 1 each, Rfl: 5   busPIRone (BUSPAR) 10 MG tablet, Take 1 tablet (10 mg total) by mouth 2 (two) times daily., Disp: 60 tablet, Rfl: 2   calcium carbonate (OS-CAL) 1250 (500 Ca) MG chewable tablet, Chew by mouth., Disp: , Rfl:    cariprazine (VRAYLAR) 1.5 MG capsule, Take 1 capsule (1.5 mg total) by mouth daily., Disp: 30 capsule, Rfl: 2   Cholecalciferol 25 MCG (1000 UT) tablet, Take 1 Units by mouth daily at 2 PM., Disp: , Rfl:    clobetasol (TEMOVATE) 0.05 % external solution, APPLY TO AFFECTED AREA TWICE A DAY, Disp: 50 mL, Rfl: 3   clonazePAM (KLONOPIN) 0.5 MG tablet, Take 1 tablet (0.5 mg total) by mouth 2 (two) times daily as needed for anxiety., Disp: 60 tablet, Rfl: 2   diazepam (VALIUM) 5 MG tablet, , Disp: , Rfl:    dicyclomine (BENTYL) 10 MG capsule, Take 10 mg by mouth as needed for spasms., Disp: , Rfl:    EPINEPHrine 0.3 mg/0.3 mL IJ SOAJ injection, Inject 0.3 mg into the muscle as needed for anaphylaxis., Disp: 2 each, Rfl: 2   fexofenadine (ALLEGRA) 180 MG tablet, Take 1 tablet (180 mg total) by mouth daily., Disp: 30 tablet, Rfl: 5   Galcanezumab-gnlm 120 MG/ML SOAJ, Inject into the skin. Emgality, Disp: , Rfl:    hydrOXYzine (VISTARIL) 25 MG capsule, Take 1-2 capsules (25-50 mg total) by mouth daily as needed., Disp: 60 capsule, Rfl: 2   LINZESS 72 MCG capsule, Take 72 mcg by mouth daily., Disp: , Rfl:    Loratadine (CLARITIN PO), Take by mouth., Disp: , Rfl:    metoprolol tartrate (LOPRESSOR) 25 MG tablet, Take 1 tablet (25 mg total) by mouth as needed., Disp: 15 tablet, Rfl: 1   montelukast (SINGULAIR) 10 MG tablet, Take 1 tablet (10 mg total) by mouth daily., Disp: 30 tablet, Rfl: 5   Multiple Vitamin (MULTIVITAMIN) capsule, Take by mouth., Disp: , Rfl:    norethindrone  (MICRONOR) 0.35 MG tablet, Take 1 tablet (0.35 mg total) by mouth daily., Disp: 28 tablet, Rfl: 11   nystatin-triamcinolone ointment (MYCOLOG), Apply 1 Application topically 2 (two) times daily., Disp: 30 g, Rfl: 0   Olopatadine HCl 0.2 % SOLN, Apply 1 drop to eye every morning., Disp: , Rfl:    ondansetron (ZOFRAN-ODT) 4 MG disintegrating tablet, DISSOLVE ON TONGUE AT ONSET OF NAUSEA- 30 DAY SUPPLY, Disp: 10 tablet, Rfl: 1   pantoprazole (PROTONIX) 40 MG tablet, Take 1 tablet by mouth in the morning and at bedtime., Disp: , Rfl:    RESTASIS 0.05 % ophthalmic emulsion, 1 drop 2 (two) times daily., Disp: , Rfl:    sertraline (ZOLOFT) 50 MG tablet, Take 1.5 tablets (75 mg total)  by mouth daily., Disp: 45 tablet, Rfl: 2   SUMAtriptan (IMITREX) 100 MG tablet, Take by mouth., Disp: , Rfl:    topiramate (TOPAMAX) 25 MG tablet, Take 1 tablet nightly for 1 week, then 1 tablet twice daily for 1 week, then take 1 tablet in the am and 2 tablets at bedtime for 1 week, then take 2 tablet by mouth twice daily., Disp: , Rfl:    VENTOLIN HFA 108 (90 Base) MCG/ACT inhaler, INHALE 2 PUFFS INTO THE LUNGS EVERY 4 HOURS AS NEEDED FOR WHEEZING OR SHORTNESS OF BREATH., Disp: 18 each, Rfl: 0  Left axilla: Erythematous, actively draining lesion noted under the left axilla.  Assessment/ Plan: 22 y.o. female   Abscess of axilla, left - Plan: doxycycline (VIBRA-TABS) 100 MG tablet, fluconazole (DIFLUCAN) 150 MG tablet  Suspect that she has a left axillary abscess.  Luckily it is actively draining so no I&D needs to be done at this time.  Discussed use of oral antibiotics and I have given her Diflucan pill if needed.  We discussed that if we are really not seeing any substantial improvement over the next several days or if she abruptly gets worse that I would like her to be seen in office for direct visualization and palpation of this lesion.  She voiced good understanding and will follow-up as needed  Start time:  12:49pm End time: 12:56pm  Total time spent on patient care (including video visit/ documentation): 7 minutes  Sanjuanita Condrey Hulen Skains, DO [facility] [phone]

## 2023-04-27 ENCOUNTER — Encounter: Payer: Self-pay | Admitting: Family Medicine

## 2023-04-27 ENCOUNTER — Encounter: Payer: Self-pay | Admitting: *Deleted

## 2023-04-27 ENCOUNTER — Ambulatory Visit (INDEPENDENT_AMBULATORY_CARE_PROVIDER_SITE_OTHER): Payer: Medicaid Other | Admitting: Family Medicine

## 2023-04-27 VITALS — BP 102/60 | HR 117 | Ht 59.0 in | Wt 237.0 lb

## 2023-04-27 DIAGNOSIS — L02419 Cutaneous abscess of limb, unspecified: Secondary | ICD-10-CM

## 2023-04-27 NOTE — Progress Notes (Signed)
BP 102/60   Pulse (!) 117   Ht 4\' 11"  (1.499 m)   Wt 237 lb (107.5 kg)   SpO2 100%   BMI 47.87 kg/m    Subjective:   Patient ID: Molly [name], female    DOB: Jul 23, 2001, 22 y.o.   MRN: [id number]  HPI: Molly [name] is a 22 y.o. female presenting on 04/27/2023 for Abscess (Right axilla. Not improving with doxy from Dr. Reece Agar)   HPI Abscess right axilla Patient is coming in for follow-up for abscess under right axilla.  She was seen virtually 2 days ago and given an antibiotic, doxycycline.  She is about 2-1/2 days into the antibiotic.  She says it is still draining but she is just concerned because it has not completely resolved.  She is getting drainage that is purulent and bloody and it is sore and in that axilla.  She has never had these before in her life.  Relevant past medical, surgical, family and social history reviewed and updated as indicated. Interim medical history since our last visit reviewed. Allergies and medications reviewed and updated.  Review of Systems  Constitutional:  Negative for chills and fever.  Eyes:  Negative for visual disturbance.  Respiratory:  Negative for chest tightness and shortness of breath.   Cardiovascular:  Negative for chest pain and leg swelling.  Skin:  Positive for color change and wound. Negative for rash.  Neurological:  Negative for light-headedness and headaches.  Psychiatric/Behavioral:  Negative for agitation and behavioral problems.   All other systems reviewed and are negative.   Per HPI unless specifically indicated above   Allergies as of 04/27/2023       Reactions   Glucosamine Forte [nutritional Supplements] Anaphylaxis   Due to containing shellfish   Nutritional Supplements Anaphylaxis   Oyster Extract Anaphylaxis   Shellfish-derived Products Anaphylaxis   Throat swelling   Cats Claw (uncaria Tomentosa) Nausea And Vomiting   Diamox [acetazolamide] Other (See Comments)   Numbness- feet, hands, lips    Lactose    Lactose Intolerance (gi)    Topiramate Other (See Comments)   Numbness to face, hands and feet   Bromfed Dm [pseudoeph-bromphen-dm] Anxiety   CARBOFED DM        Medication List        Accurate as of Apr 27, 2023 12:24 PM. If you have any questions, ask your nurse or doctor.          albuterol (2.5 MG/3ML) 0.083% nebulizer solution Commonly known as: PROVENTIL Take 3 mLs (2.5 mg total) by nebulization every 6 (six) hours as needed for wheezing or shortness of breath.   Ventolin HFA 108 (90 Base) MCG/ACT inhaler Generic drug: albuterol INHALE 2 PUFFS INTO THE LUNGS EVERY 4 HOURS AS NEEDED FOR WHEEZING OR SHORTNESS OF BREATH.   ascorbic acid 500 MG tablet Commonly known as: VITAMIN C Take 500 mg by mouth daily.   atomoxetine 60 MG capsule Commonly known as: Strattera Take 1 capsule (60 mg total) by mouth daily.   Azelastine-Fluticasone 137-50 MCG/ACT Susp Place 1 spray into both nostrils 2 (two) times daily.   bisoprolol 10 MG tablet Commonly known as: ZEBETA Take 1 tablet (10 mg total) by mouth daily.   budesonide-formoterol 160-4.5 MCG/ACT inhaler Commonly known as: Symbicort Inhale 2 puffs into the lungs in the morning and at bedtime.   busPIRone 10 MG tablet Commonly known as: BUSPAR Take 1 tablet (10 mg total) by mouth 2 (two) times daily.  calcium carbonate 1250 (500 Ca) MG chewable tablet Commonly known as: OS-CAL Chew by mouth.   cariprazine 1.5 MG capsule Commonly known as: Vraylar Take 1 capsule (1.5 mg total) by mouth daily.   Cholecalciferol 25 MCG (1000 UT) tablet Take 1 Units by mouth daily at 2 PM.   CLARITIN PO Take by mouth.   clobetasol 0.05 % external solution Commonly known as: TEMOVATE APPLY TO AFFECTED AREA TWICE A DAY   clonazePAM 0.5 MG tablet Commonly known as: KlonoPIN Take 1 tablet (0.5 mg total) by mouth 2 (two) times daily as needed for anxiety.   diazepam 5 MG tablet Commonly known as: VALIUM    dicyclomine 10 MG capsule Commonly known as: BENTYL Take 10 mg by mouth as needed for spasms.   doxycycline 100 MG tablet Commonly known as: VIBRA-TABS Take 1 tablet (100 mg total) by mouth 2 (two) times daily for 10 days.   EPINEPHrine 0.3 mg/0.3 mL Soaj injection Commonly known as: EPI-PEN Inject 0.3 mg into the muscle as needed for anaphylaxis.   fexofenadine 180 MG tablet Commonly known as: ALLEGRA Take 1 tablet (180 mg total) by mouth daily.   Galcanezumab-gnlm 120 MG/ML Soaj Inject into the skin. Emgality   hydrOXYzine 25 MG capsule Commonly known as: VISTARIL Take 1-2 capsules (25-50 mg total) by mouth daily as needed.   Linzess 72 MCG capsule Generic drug: linaclotide Take 72 mcg by mouth daily.   metoprolol tartrate 25 MG tablet Commonly known as: LOPRESSOR Take 1 tablet (25 mg total) by mouth as needed.   montelukast 10 MG tablet Commonly known as: SINGULAIR Take 1 tablet (10 mg total) by mouth daily.   multivitamin capsule Take by mouth.   norethindrone 0.35 MG tablet Commonly known as: MICRONOR Take 1 tablet (0.35 mg total) by mouth daily.   nystatin-triamcinolone ointment Commonly known as: MYCOLOG Apply 1 Application topically 2 (two) times daily.   Olopatadine HCl 0.2 % Soln Apply 1 drop to eye every morning.   ondansetron 4 MG disintegrating tablet Commonly known as: ZOFRAN-ODT DISSOLVE ON TONGUE AT ONSET OF NAUSEA- 30 DAY SUPPLY   pantoprazole 40 MG tablet Commonly known as: PROTONIX Take 1 tablet by mouth in the morning and at bedtime.   Restasis 0.05 % ophthalmic emulsion Generic drug: cycloSPORINE 1 drop 2 (two) times daily.   sertraline 50 MG tablet Commonly known as: Zoloft Take 1.5 tablets (75 mg total) by mouth daily.   SUMAtriptan 100 MG tablet Commonly known as: IMITREX Take by mouth.   topiramate 25 MG tablet Commonly known as: TOPAMAX Take 1 tablet nightly for 1 week, then 1 tablet twice daily for 1 week, then take 1  tablet in the am and 2 tablets at bedtime for 1 week, then take 2 tablet by mouth twice daily.         Objective:   BP 102/60   Pulse (!) 117   Ht 4\' 11"  (1.499 m)   Wt 237 lb (107.5 kg)   SpO2 100%   BMI 47.87 kg/m   Wt Readings from Last 3 Encounters:  04/27/23 237 lb (107.5 kg) (>99 %, Z= 2.39)*  03/29/23 241 lb 8 oz (109.5 kg) (>99 %, Z= 2.43)*  03/06/23 247 lb (112 kg) (>99 %, Z= 2.48)*   * Growth percentiles are based on CDC (Girls, 2-20 Years) data.    Physical Exam Vitals and nursing note reviewed.  Constitutional:      Appearance: Normal appearance.  Skin:    Findings:  Abscess and lesion (Right axillary abscess, 0.25 palpable induration with no flexion patient, able to rest sanguinous and purulent discharge from small opening.) present.  Neurological:     Mental Status: She is alert.       Assessment & Plan:   Problem List Items Addressed This Visit   None Visit Diagnoses     Axillary abscess    -  Primary       Put probe in and able to open up the loculations.  Draining good.  Continue antibiotic  She should still have 7 more days of doxycycline, continue with that and call us back if not improving Follow up plan: Return if symptoms worsen or fail to improve.  Counseling provided for all of the vaccine components No orders of the defined types were placed in this encounter.   Arville Care, MD [facility] 04/27/2023, 12:24 PM

## 2023-05-01 ENCOUNTER — Ambulatory Visit: Payer: Medicaid Other | Admitting: Adult Health

## 2023-05-02 ENCOUNTER — Encounter: Payer: Self-pay | Admitting: Nurse Practitioner

## 2023-05-02 ENCOUNTER — Ambulatory Visit (INDEPENDENT_AMBULATORY_CARE_PROVIDER_SITE_OTHER): Payer: Medicaid Other | Admitting: Nurse Practitioner

## 2023-05-02 VITALS — BP 103/79 | HR 104 | Temp 97.0°F | Ht 59.0 in | Wt 234.6 lb

## 2023-05-02 DIAGNOSIS — R61 Generalized hyperhidrosis: Secondary | ICD-10-CM | POA: Diagnosis not present

## 2023-05-02 DIAGNOSIS — L02419 Cutaneous abscess of limb, unspecified: Secondary | ICD-10-CM | POA: Insufficient documentation

## 2023-05-02 DIAGNOSIS — L02411 Cutaneous abscess of right axilla: Secondary | ICD-10-CM | POA: Diagnosis not present

## 2023-05-02 DIAGNOSIS — L02412 Cutaneous abscess of left axilla: Secondary | ICD-10-CM

## 2023-05-02 MED ORDER — DOXYCYCLINE HYCLATE 100 MG PO TABS
100.0000 mg | ORAL_TABLET | Freq: Two times a day (BID) | ORAL | 0 refills | Status: AC
Start: 2023-05-02 — End: 2023-05-12

## 2023-05-02 NOTE — Progress Notes (Addendum)
Acute Office Visit  Subjective:     Patient ID: Molly [name], female    DOB: February 01, 2001, 22 y.o.   MRN: [id number]  Chief Complaint  Patient presents with   sore    Been there for about 3 weeks. Abscess under right arm seen twice for it. Seen [facility] last was told if it stopped draining to come back. Pt says it has stopped draining and now feels bigger.    HPI Molly [name] is a 22 yr.old female here with mom and brother for an acute concerns that abscess has stopped drained and believed that is has expand. She is currently on Doxy an has 2 days left. She reports no drainage for 2-days and was told to come if te drainage stopped. Denies fever ROS Negative unless indicated in HPI    Objective:    BP 103/79   Pulse (!) 104   Temp (!) 97 F (36.1 C) (Temporal)   Ht 4\' 11"  (1.499 m)   Wt 234 lb 9.6 oz (106.4 kg)   SpO2 98%   BMI 47.38 kg/m  BP Readings from Last 3 Encounters:  05/02/23 103/79  04/27/23 102/60  03/29/23 112/73      Physical Exam Vitals and nursing note reviewed.  Constitutional:      Appearance: Normal appearance.  Cardiovascular:     Rate and Rhythm: Normal rate.     Pulses: Normal pulses.  Pulmonary:     Effort: Pulmonary effort is normal.     Breath sounds: Normal breath sounds.  Skin:    General: Skin is warm and dry.     Findings: Abscess, erythema and wound present.     Comments: Right axilla No color change  Neurological:     General: No focal deficit present.     Mental Status: She is alert and oriented to person, place, and time.  Psychiatric:        Mood and Affect: Mood normal.        Behavior: Behavior normal.        Thought Content: Thought content normal.        Judgment: Judgment normal.     No results found for any visits on 05/02/23.     I & D  Date/Time: 05/02/2023 4:26 PM  Performed by: Martina Sinner, NP Authorized by: Martina Sinner, NP   Consent:    Consent obtained:   Verbal   Consent given by:  Patient   Risks, benefits, and alternatives were discussed: yes     Risks discussed:  Incomplete drainage, pain, infection and bleeding Location:    Type:  Abscess   Location:  Upper extremity Pre-procedure details:    Skin preparation:  Povidone-iodine Sedation:    Sedation type:  Moderate (conscious) sedation Anesthesia:    Anesthesia method:  Topical application Procedure type:    Complexity:  Simple Procedure details:    Needle aspiration: no     Incision depth:  Dermal   Scalpel blade:  10   Wound management:  Irrigated with saline   Drainage:  Bloody and purulent   Drainage amount:  Copious   Wound treatment:  Wound left open   Packing materials:  1/2 in gauze Post-procedure details:    Procedure completion:  Tolerated well, no immediate complications   Assessment & Plan:  Axillary abscess -     Doxycycline Hyclate; Take 1 tablet (100 mg total) by mouth 2 (two) times daily for 10 days.  Dispense: 14 tablet; Refill: 0 -     I & D  Hyperhidrosis -     Doxycycline Hyclate; Take 1 tablet (100 mg total) by mouth 2 (two) times daily for 10 days.  Dispense: 14 tablet; Refill: 0  Abscess of axilla, right -     Doxycycline Hyclate; Take 1 tablet (100 mg total) by mouth 2 (two) times daily for 10 days.  Dispense: 14 tablet; Refill: 0 -     I & D   Assessment Right axillary  abscess Plan I/D perform at bedside  Extend Doxycyline 10 mg for an extra 7-days  Return if symptoms worsen or fail to improve, for follow-up.  [address] [place], [place]

## 2023-05-03 ENCOUNTER — Encounter: Payer: Self-pay | Admitting: Family Medicine

## 2023-05-04 DIAGNOSIS — J4550 Severe persistent asthma, uncomplicated: Secondary | ICD-10-CM | POA: Diagnosis not present

## 2023-05-10 ENCOUNTER — Ambulatory Visit (INDEPENDENT_AMBULATORY_CARE_PROVIDER_SITE_OTHER): Payer: Medicaid Other | Admitting: "Endocrinology

## 2023-05-10 ENCOUNTER — Encounter: Payer: Self-pay | Admitting: "Endocrinology

## 2023-05-10 VITALS — BP 120/82 | HR 88 | Ht 59.0 in | Wt 231.0 lb

## 2023-05-10 DIAGNOSIS — R61 Generalized hyperhidrosis: Secondary | ICD-10-CM | POA: Diagnosis not present

## 2023-05-10 DIAGNOSIS — R7303 Prediabetes: Secondary | ICD-10-CM

## 2023-05-10 DIAGNOSIS — E782 Mixed hyperlipidemia: Secondary | ICD-10-CM

## 2023-05-10 DIAGNOSIS — I10 Essential (primary) hypertension: Secondary | ICD-10-CM

## 2023-05-10 NOTE — Progress Notes (Signed)
Endocrinology Consult Note                                            05/10/2023, 5:46 PM   Subjective:    Patient ID: Molly [name], female    DOB: 06-28-2001, PCP Sonny Masters, FNP   Past Medical History:  Diagnosis Date   Acid reflux 2010   Acne vulgaris 2017   ADHD (attention deficit hyperactivity disorder) 2011   Adverse food reaction 07/11/2016   Shellfish allergy   Amenorrhea 2019   Anaphylactic shock due to adverse food reaction 01/15/2018   Anxiety 2017   ASD (atrial septal defect) 11/22/2022   Chronic constipation 09/05/2011   Chronic tension headaches 2011   originally followed by [facility] K.Griffin. Then Dr Alexis Goodell [facility] 03/2020 (see above)    Coalition, calcaneus navicular    Dysmenorrhea    Eczema    Eustachian tube dysfunction, left 10/23/2019   Frequent headaches    Galactorrhea    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 04/01/2019   Hiatal hernia    History of atrial septal defect repair 12/04/2011   Hyperlipidemia 2013   Hypertension 2011   Hypertension 09/28/2020   hypertension, cellulitus    Idiopathic urticaria 09/05/2011   Insomnia    Keratosis pilaris 01/15/2018   Mass of left thigh 11/22/2017   Overview:  Added automatically from request for surgery [id number]   MDD (major depressive disorder), severe (HCC) 03/06/2018   Migraine 2011   re-diagnosed as chronic migraines 03/2020 [facility] - starting on preventative    Obesity without serious comorbidity with body mass index (BMI) in 95th to 98th percentile for age in pediatric patient 09/04/2018   Piriformis syndrome    POTS (postural orthostatic tachycardia syndrome)    PTSD (post-traumatic stress disorder)    Scoliosis 2019   Seasonal and perennial allergic rhinitis 2010   Severe persistent asthma without complication 2010   Sleep paralysis    Suicidal ideation 03/07/2018   Tic disorder 2019   Past Surgical History:  Procedure Laterality Date   ASD REPAIR  2012   with Helix,  via Cardiac Catheterization   atrial septal defecgt     CARDIAC SURGERY     DENTAL SURGERY  2020   lipoma removal  2019   LUMBAR PUNCTURE  05/20/2021   TONSILLECTOMY AND ADENOIDECTOMY  2010   Social History   Socioeconomic History   Marital status: Single    Spouse name: Not on file   Number of children: Not on file   Years of education: Not on file   Highest education level: Not on file  Occupational History   Not on file  Tobacco Use   Smoking status: Never   Smokeless tobacco: Never  Vaping Use   Vaping Use: Never used  Substance and Sexual Activity   Alcohol use: No    Alcohol/week: 0.0 standard drinks of alcohol   Drug use: No   Sexual activity: Never    Birth control/protection: Pill  Other Topics Concern   Not on file  Social History Narrative   Not on file   Social Determinants of Health   Financial Resource Strain: Low Risk  (03/06/2023)   Overall Financial Resource Strain (CARDIA)    Difficulty of Paying Living Expenses: Not hard at all  Food Insecurity: No Food Insecurity (03/06/2023)  Hunger Vital Sign    Worried About Running Out of Food in the Last Year: Never true    Ran Out of Food in the Last Year: Never true  Transportation Needs: No Transportation Needs (03/06/2023)   PRAPARE - Administrator, Civil Service (Medical): No    Lack of Transportation (Non-Medical): No  Physical Activity: Inactive (03/06/2023)   Exercise Vital Sign    Days of Exercise per Week: 0 days    Minutes of Exercise per Session: 0 min  Stress: No Stress Concern Present (03/06/2023)   [employer] of Occupational Health - Occupational Stress Questionnaire    Feeling of Stress : Only a little  Social Connections: Socially Isolated (03/06/2023)   Social Connection and Isolation Panel [NHANES]    Frequency of Communication with Friends and Family: Three times a week    Frequency of Social Gatherings with Friends and Family: Once a week    Attends Religious Services:  Never    Database administrator or Organizations: No    Attends Engineer, structural: Never    Marital Status: Never married   Family History  Problem Relation Age of Onset   Hypertension Mother    Hyperlipidemia Mother    Diabetes Mother    Depression Mother    Arthritis Mother    Allergic rhinitis Mother    Asthma Mother    Bipolar disorder Mother    Anxiety disorder Mother    Mental illness Mother    Miscarriages / [place] Mother    Thyroid disease Mother    Bipolar disorder Father    Drug abuse Father    Alcohol abuse Father    ADD / ADHD Father    Mental illness Father    Allergic rhinitis Brother    Asthma Brother    ADD / ADHD Brother    Learning disabilities Brother    Hypertension Maternal Grandmother    Hyperlipidemia Maternal Grandmother    Heart disease Maternal Grandmother    Heart attack Maternal Grandfather    Bipolar disorder Maternal Grandfather    Angioedema Neg Hx    Eczema Neg Hx    Immunodeficiency Neg Hx    Urticaria Neg Hx    Outpatient Encounter Medications as of 05/10/2023  Medication Sig   albuterol (PROVENTIL) (2.5 MG/3ML) 0.083% nebulizer solution Take 3 mLs (2.5 mg total) by nebulization every 6 (six) hours as needed for wheezing or shortness of breath.   ascorbic acid (VITAMIN C) 500 MG tablet Take 500 mg by mouth daily. (Patient not taking: Reported on 05/10/2023)   atomoxetine (STRATTERA) 60 MG capsule Take 1 capsule (60 mg total) by mouth daily.   Azelastine-Fluticasone 137-50 MCG/ACT SUSP Place 1 spray into both nostrils 2 (two) times daily.   bisoprolol (ZEBETA) 10 MG tablet Take 1 tablet (10 mg total) by mouth daily.   budesonide-formoterol (SYMBICORT) 160-4.5 MCG/ACT inhaler Inhale 2 puffs into the lungs in the morning and at bedtime.   busPIRone (BUSPAR) 10 MG tablet Take 1 tablet (10 mg total) by mouth 2 (two) times daily.   calcium carbonate (OS-CAL) 1250 (500 Ca) MG chewable tablet Chew by mouth.   cariprazine (VRAYLAR)  1.5 MG capsule Take 1 capsule (1.5 mg total) by mouth daily.   Cholecalciferol 25 MCG (1000 UT) tablet Take 1 Units by mouth daily at 2 PM.   clobetasol (TEMOVATE) 0.05 % external solution APPLY TO AFFECTED AREA TWICE A DAY   clonazePAM (KLONOPIN) 0.5 MG tablet Take  1 tablet (0.5 mg total) by mouth 2 (two) times daily as needed for anxiety.   diazepam (VALIUM) 5 MG tablet  (Patient not taking: Reported on 05/10/2023)   dicyclomine (BENTYL) 10 MG capsule Take 10 mg by mouth as needed for spasms.   doxycycline (VIBRA-TABS) 100 MG tablet Take 1 tablet (100 mg total) by mouth 2 (two) times daily for 10 days.   EPINEPHrine 0.3 mg/0.3 mL IJ SOAJ injection Inject 0.3 mg into the muscle as needed for anaphylaxis.   fexofenadine (ALLEGRA) 180 MG tablet Take 1 tablet (180 mg total) by mouth daily.   Galcanezumab-gnlm 120 MG/ML SOAJ Inject into the skin. Emgality   hydrOXYzine (VISTARIL) 25 MG capsule Take 1-2 capsules (25-50 mg total) by mouth daily as needed.   LINZESS 72 MCG capsule Take 72 mcg by mouth daily.   Loratadine (CLARITIN PO) Take by mouth.   metoprolol tartrate (LOPRESSOR) 25 MG tablet Take 1 tablet (25 mg total) by mouth as needed.   montelukast (SINGULAIR) 10 MG tablet Take 1 tablet (10 mg total) by mouth daily.   Multiple Vitamin (MULTIVITAMIN) capsule Take by mouth.   norethindrone (MICRONOR) 0.35 MG tablet Take 1 tablet (0.35 mg total) by mouth daily.   nystatin-triamcinolone ointment (MYCOLOG) Apply 1 Application topically 2 (two) times daily.   Olopatadine HCl 0.2 % SOLN Apply 1 drop to eye every morning.   ondansetron (ZOFRAN-ODT) 4 MG disintegrating tablet DISSOLVE ON TONGUE AT ONSET OF NAUSEA- 30 DAY SUPPLY   pantoprazole (PROTONIX) 40 MG tablet Take 1 tablet by mouth in the morning and at bedtime.   RESTASIS 0.05 % ophthalmic emulsion 1 drop 2 (two) times daily.   sertraline (ZOLOFT) 50 MG tablet Take 1.5 tablets (75 mg total) by mouth daily.   SUMAtriptan (IMITREX) 100 MG tablet  Take by mouth as needed.   topiramate (TOPAMAX) 25 MG tablet Take 50 mg by mouth 2 (two) times daily.   VENTOLIN HFA 108 (90 Base) MCG/ACT inhaler INHALE 2 PUFFS INTO THE LUNGS EVERY 4 HOURS AS NEEDED FOR WHEEZING OR SHORTNESS OF BREATH.   No facility-administered encounter medications on file as of 05/10/2023.   ALLERGIES: Allergies  Allergen Reactions   Glucosamine Forte [Nutritional Supplements] Anaphylaxis    Due to containing shellfish   Nutritional Supplements Anaphylaxis   Oyster Extract Anaphylaxis   Shellfish-Derived Products Anaphylaxis    Throat swelling   Cats Claw (Uncaria Tomentosa) Nausea And Vomiting   Diamox [Acetazolamide] Other (See Comments)    Numbness- feet, hands, lips   Lactose    Lactose Intolerance (Gi)    Topiramate Other (See Comments)    Numbness to face, hands and feet   Bromfed Dm [Pseudoeph-Bromphen-Dm] Anxiety    CARBOFED DM    VACCINATION STATUS: Immunization History  Administered Date(s) Administered   COVID-19, mRNA, vaccine(Comirnaty)12 years and older 11/17/2022   DTaP 09/24/2003, 12/10/2003, 02/16/2004, 11/01/2004, 07/23/2007, 07/09/2012   Dtap, Unspecified 11/01/2004, 07/23/2007   H1N1 10/07/2008, 11/12/2008   HIB (PRP-OMP) 09/24/2003, 12/10/2003, 02/16/2004, 11/01/2004, 07/23/2017   HIB (PRP-T) 09/24/2003, 12/10/2003, 02/16/2004   HIB, Unspecified 11/01/2004   HPV Quadrivalent 09/04/2013, 11/05/2013, 03/20/2014   Hep A, Unspecified 07/24/2006, 07/23/2007   Hep B, Unspecified 09/03/2003, 12/10/2003, 07/26/2004   IPV 09/24/2003, 12/10/2003, 11/01/2004, 07/23/2007   Influenza Nasal 08/10/2010, 09/03/2012, 09/04/2013, 09/14/2014   Influenza Split 09/05/2011, 09/05/2021   Influenza, Seasonal, Injecte, Preservative Fre 10/12/2005, 10/07/2008, 09/27/2009   Influenza,Quad,Nasal, Live 09/03/2012, 09/04/2013, 09/14/2014   Influenza,inj,Quad PF,6+ Mos 08/30/2015, 09/21/2016, 09/13/2017, 09/06/2018, 08/27/2019, 11/12/2020,  09/05/2021, 10/11/2022    Influenza,inj,quad, With Preservative 10/12/2018   Influenza-Unspecified 09/05/2011, 09/05/2011   MMR 07/26/2004, 07/23/2007   Meningococcal B, OMV 10/21/2019, 05/13/2020   Meningococcal Mcv4o 09/14/2014, 10/21/2019   PFIZER Comirnaty(Gray Top)Covid-19 Tri-Sucrose Vaccine 04/01/2021, 05/06/2021   Pneumococcal Conjugate PCV 7 09/24/2003, 12/10/2003, 02/16/2004, 11/01/2004   Pneumococcal Polysaccharide-23 07/09/2012, 03/06/2022   Pneumococcal-Unspecified 09/24/2003, 02/16/2004, 11/01/2004, 12/09/2006   Polio, Unspecified 11/01/2004, 07/23/2007   Tdap 09/14/2014   Varicella 07/26/2004, 07/23/2007    HPI MOOREA SNOOKS is 22 y.o. female who presents today with a medical history as above. she is being seen in consultation for hyperhidrosis requested by Sonny Masters, FNP.  She is accompanied by her mother to clinic.  She denies any prior history of adrenal, thyroid nor hypothalamic dysfunction.  Patient's main concern is hyperhidrosis which is happening on a regular basis, no particular association with seasons, circadian rhythm, no emotional status.  She dealt with this complaint for at least 5 years.  She underwent several workup including 24-hour urine metanephrines measurements as well as adrenal CT scan which did not show any particular pathology.  Of note patient is on polypharmacy for multiple medical problems including mood disorders, bipolar disorders, anxiety disorders. Patient has been overweight/obese for most of her life including during her pediatric ages. She currently weighs 231, at a height of 411 given her BMI of 46.66. She has prediabetes, PCOS, hyperlipidemia, hypertension, besides her mental illnesses. She is 22 years old with 30+ medications on her list. Her active medications include Strattera, azelastine-fluticasone, bisoprolol fumarate, Symbicort, buspirone, cariprazine, Klonopin, Valium, Benadryl, Allegra, Vistaril, Singulair, Zoloft, Imitrex, Topamax.  There  is significant family history of obesity, type 2 diabetes, hypertension, hyperlipidemia, asthma, thyroid allergy conditions among others. She gives history of being suicidal in 2019, however the medications she is taking currently at the end of her regain her insight. Patient is not following any particular diet or exercise program. 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 She denies any exposure to heavy dose steroids, except in her bronchodilators.   Review of Systems  Constitutional: + Progressive weight gain,+ fatigue, no subjective hyperthermia, no subjective hypothermia Eyes: no blurry vision, no xerophthalmia ENT: no sore throat, no nodules palpated in throat, no dysphagia/odynophagia, no hoarseness Cardiovascular: no Chest Pain, +Shortness of Breath on exertion, no palpitations, no leg swelling Respiratory: no cough, + shortness of breath Gastrointestinal: no Nausea/Vomiting/Diarhhea Musculoskeletal: no muscle/joint aches Skin: no rashes, + nonspecific  hyperhidrosis Neurological: no tremors, no numbness, no tingling, no dizziness Psychiatric: + depression, + anxiety  Objective:       05/10/2023    2:12 PM 05/02/2023    2:20 PM 04/27/2023   11:59 AM  Vitals with BMI  Height 4\' 11"  4\' 11"  4\' 11"   Weight 231 lbs 234 lbs 10 oz 237 lbs  BMI 46.63 47.36 47.84  Systolic 120 103 782  Diastolic 82 79 60  Pulse 88 104 117    BP 120/82   Pulse 88   Ht 4\' 11"  (1.499 m)   Wt 231 lb (104.8 kg)   BMI 46.66 kg/m   Wt Readings from Last 3 Encounters:  05/10/23 231 lb (104.8 kg) (>99 %, Z= 2.33)*  05/02/23 234 lb 9.6 oz (106.4 kg) (>99 %, Z= 2.37)*  04/27/23 237 lb (107.5 kg) (>99 %, Z= 2.39)*   * Growth percentiles are based on CDC (Girls, 2-20 Years) data.    Physical Exam  Constitutional:  Body mass index is 46.66 kg/m.,  not in acute distress, normal state of mind Eyes: PERRLA, EOMI, no exophthalmos ENT: moist mucous membranes, no gross thyromegaly, no gross  cervical lymphadenopathy Cardiovascular: normal precordial activity, Regular Rate and Rhythm, no Murmur/Rubs/Gallops Respiratory:  adequate breathing efforts, no gross chest deformity, Clear to auscultation bilaterally Gastrointestinal: abdomen - + Obese, Soft, Non -tender, No distension, Bowel Sounds present, no gross organomegaly Musculoskeletal: no gross deformities, strength intact in all four extremities, no peripheral edema Skin: moist, warm, no rashes Neurological: no tremor with outstretched hands, Deep tendon reflexes normal in bilateral lower extremities.  CMP ( most recent) CMP     Component Value Date/Time   NA 142 10/11/2022 1556   K 4.0 10/11/2022 1556   CL 107 (H) 10/11/2022 1556   CO2 21 10/11/2022 1556   GLUCOSE 170 (H) 10/11/2022 1556   GLUCOSE 103 (H) 04/05/2018 1818   BUN 5 (L) 10/11/2022 1556   CREATININE 0.57 10/11/2022 1556   CALCIUM 9.3 10/11/2022 1556   PROT 6.1 10/11/2022 1556   ALBUMIN 4.3 10/11/2022 1556   AST 24 10/11/2022 1556   ALT 34 (H) 10/11/2022 1556   ALKPHOS 76 10/11/2022 1556   BILITOT <0.2 10/11/2022 1556   EGFR 134 10/11/2022 1556   GFRNONAA NOT CALCULATED 04/05/2018 1818     Diabetic Labs (most recent): Lab Results  Component Value Date   HGBA1C 5.8 (H) 10/11/2022   HGBA1C 5.2 03/06/2022   HGBA1C 5.6 11/26/2019     Lipid Panel ( most recent) Lipid Panel     Component Value Date/Time   CHOL 191 (H) 10/11/2022 1556   TRIG 188 (H) 10/11/2022 1556   HDL 37 (L) 10/11/2022 1556   CHOLHDL 5.2 (H) 10/11/2022 1556   CHOLHDL 3.3 03/07/2018 0713   VLDL 20 03/07/2018 0713   LDLCALC 121 (H) 10/11/2022 1556   LABVLDL 33 10/11/2022 1556      Lab Results  Component Value Date   TSH 1.550 03/06/2022   TSH 2.434 03/07/2018   FREET4 1.10 03/06/2022       Assessment & Plan:   1. Hyperhidrosis 2. Prediabetes 3.  Morbid obesity 4.  Hypertension 5.  Hyperlipidemia   - AZHIA FARNSWORTH  is being seen at a kind request of  Rakes, Doralee Albino, FNP. - I have reviewed her available  records and clinically evaluated the patient. 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 -She was previously worked up and ruled out for pheochromocytoma.  She will need workup for carcinoids, Cushing's, thyrotoxicosis with basic labs.   She will have 24-hour urine collection for 5 HIAA with creatinine, cortisol with creatinine. She will also have thyroid function test, 25-hydroxy vitamin D, plasma free metanephrines.  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 She has at least 8 medications with known side effects of hyperhidrosis including Strattera, azelastine-fluticasone, buspirone, cariprazine, Klonopin, Valium, Zoloft, Imitrex. -It seems like she is benefiting from this medications.  Unfortunately patient is on serious polypharmacy with 30+ medications at age 29.  Her medication list deserves to be revised and revisited by respective prescribers.  I advised her to reduce concern during her next visits with her psychiatrist as well as primary care provider. In light of her metabolic dysfunction with morbid obesity, PCOS, hyperlipidemia, hypertension, prediabetes, mental illness, this patient he will be best candidate for lifestyle medicine. To my surprise, she states she would consider switching her lifestyle to healthier options  I gave her a chance in the package of lifestyle medicine. - she acknowledges that there is a room for improvement in her food and drink choices. - Suggestion is made for her to avoid simple carbohydrates  from her diet including Cakes, Sweet Desserts, Ice Cream, Soda (diet and regular), Sweet Tea, Candies, Chips, Cookies, Store Bought Juices, Alcohol , Artificial Sweeteners,  Coffee Creamer, and "Sugar-free" Products, Lemonade. This will help patient to have more stable blood glucose profile  and potentially avoid unintended weight gain.  The following Lifestyle Medicine recommendations according to American College of Lifestyle Medicine  [facility]) were discussed and and offered to patient and she  agrees to start the journey:  A. Whole Foods, Plant-Based Nutrition comprising of fruits and vegetables, plant-based proteins, whole-grain carbohydrates was discussed in detail with the patient.   A list for source of those nutrients were also provided to the patient.  Patient will use only water or unsweetened tea for hydration. B.  The need to stay away from risky substances including alcohol, smoking; obtaining 7 to 9 hours of restorative sleep, at least 150 minutes of moderate intensity exercise weekly, the importance of healthy social connections,  and stress management techniques were discussed. C.  A full color page of  Calorie density of various food groups per pound showing examples of each food groups was provided to the patient.  Even with partial engagement, this patient can drop significant benefit potentially coming off of most of her medications.   If her  labs are suggestive of any endocrine dysfunction, she will be considered for specific treatment accordingly.    - she is advised to maintain close follow up with Rakes, Doralee Albino, FNP for primary care needs.   - Time spent with the patient: 62 minutes, of which >50% was spent in  counseling her about her hyperhidrosis, metabolic dysfunction and the stated above and the rest in obtaining information about her symptoms, reviewing her previous labs/studies ( including abstractions from other facilities),  evaluations, and treatments,  and developing a plan to confirm diagnosis and long term treatment based on the latest standards of care/guidelines; and documenting her care.  Molly [name] participated in the discussions, expressed understanding, and voiced agreement with the above plans.  All questions were answered to her  satisfaction. she is encouraged to contact clinic should she have any questions or concerns prior to her return visit.  Follow up plan: Return in about 2 weeks (around 05/24/2023), or and 24 hour urine studies, for F/U with Pre-visit Labs.   Marquis Lunch, MD [facility] [address] [place], [place] [address] Phone: [phone]  Fax: [phone]     05/10/2023, 5:46 PM  This note was partially dictated with voice recognition software. Similar sounding words can be transcribed inadequately or may not  be corrected upon review.

## 2023-05-10 NOTE — Patient Instructions (Signed)

## 2023-05-14 ENCOUNTER — Encounter: Payer: Self-pay | Admitting: Internal Medicine

## 2023-05-14 ENCOUNTER — Ambulatory Visit: Payer: Medicaid Other | Attending: Internal Medicine | Admitting: Internal Medicine

## 2023-05-14 VITALS — BP 86/58 | HR 100 | Ht 59.0 in | Wt 229.4 lb

## 2023-05-14 DIAGNOSIS — I951 Orthostatic hypotension: Secondary | ICD-10-CM | POA: Diagnosis present

## 2023-05-14 DIAGNOSIS — I10 Essential (primary) hypertension: Secondary | ICD-10-CM | POA: Insufficient documentation

## 2023-05-14 MED ORDER — BISOPROLOL FUMARATE 5 MG PO TABS: 7.5000 mg | ORAL_TABLET | Freq: Every day | ORAL | 3 refills | Status: DC

## 2023-05-14 NOTE — Progress Notes (Signed)
Patient Care Team: Sonny Masters, FNP as PCP - General (Family Medicine) Dellis Anes Hetty Ely, MD as Consulting Physician (Allergy and Immunology) Myrlene Broker, MD as Consulting Physician [facility]) Shaune Pollack, MD as Consulting Physician (Neurology) Alen Bleacher, Winferd Humphrey, MD as Consulting Physician (Pediatric Gastroenterology)   HPI  Molly [name] is a 22 y.o. female Seen in follow-up for orthostatic intolerance in the context of hypertension.  History of ASD repair morbid obesity sleep disordered breathing.  History of depression  She was referred to Dr. Duke Salvia for evaluation of secondary hypertension given her young age; bisoprolol was increased.  When she was seen a few months later her blood pressures were better.  Urine metanephrines were borderline elevated.  Repeats are pending.  Blood pressures of late have been now down in the 80s and 90s range.  Recurrent episodes of lightheadedness.  Has lost 25 pounds in the last 6 months.  Not quite sure why.  Has changed her birth control with different doses of hormones and has feels a great deal better.  She is now 3 months away from finishing her online class for C[employer].  Records and Results Reviewed   Past Medical History:  Diagnosis Date   Acid reflux 2010   Acne vulgaris 2017   ADHD (attention deficit hyperactivity disorder) 2011   Adverse food reaction 07/11/2016   Shellfish allergy   Amenorrhea 2019   Anaphylactic shock due to adverse food reaction 01/15/2018   Anxiety 2017   ASD (atrial septal defect) 11/22/2022   Chronic constipation 09/05/2011   Chronic tension headaches 2011   originally followed by [facility] K.Griffin. Then Dr Alexis Goodell [facility] 03/2020 (see above)    Coalition, calcaneus navicular    Dysmenorrhea    Eczema    Eustachian tube dysfunction, left 10/23/2019   Frequent headaches    Galactorrhea    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 04/01/2019   Hiatal  hernia    History of atrial septal defect repair 12/04/2011   Hyperlipidemia 2013   Hypertension 2011   Hypertension 09/28/2020   hypertension, cellulitus    Idiopathic urticaria 09/05/2011   Insomnia    Keratosis pilaris 01/15/2018   Mass of left thigh 11/22/2017   Overview:  Added automatically from request for surgery [id number]   MDD (major depressive disorder), severe (HCC) 03/06/2018   Migraine 2011   re-diagnosed as chronic migraines 03/2020 [facility] - starting on preventative    Obesity without serious comorbidity with body mass index (BMI) in 95th to 98th percentile for age in pediatric patient 09/04/2018   Piriformis syndrome    POTS (postural orthostatic tachycardia syndrome)    PTSD (post-traumatic stress disorder)    Scoliosis 2019   Seasonal and perennial allergic rhinitis 2010   Severe persistent asthma without complication 2010   Sleep paralysis    Suicidal ideation 03/07/2018   Tic disorder 2019    Past Surgical History:  Procedure Laterality Date   ASD REPAIR  2012   with Helix, via Cardiac Catheterization   atrial septal defecgt     CARDIAC SURGERY     DENTAL SURGERY  2020   lipoma removal  2019   LUMBAR PUNCTURE  05/20/2021   TONSILLECTOMY AND ADENOIDECTOMY  2010    Current Meds  Medication Sig   albuterol (PROVENTIL) (2.5 MG/3ML) 0.083% nebulizer solution Take 3 mLs (2.5 mg total) by nebulization every 6 (six) hours as needed for wheezing or shortness of breath.  ascorbic acid (VITAMIN C) 500 MG tablet Take 500 mg by mouth daily.   atomoxetine (STRATTERA) 60 MG capsule Take 1 capsule (60 mg total) by mouth daily.   Azelastine-Fluticasone 137-50 MCG/ACT SUSP Place 1 spray into both nostrils 2 (two) times daily.   bisoprolol (ZEBETA) 10 MG tablet Take 1 tablet (10 mg total) by mouth daily.   budesonide-formoterol (SYMBICORT) 160-4.5 MCG/ACT inhaler Inhale 2 puffs into the lungs in the morning and at bedtime.   busPIRone (BUSPAR) 10 MG tablet Take 1 tablet  (10 mg total) by mouth 2 (two) times daily.   calcium carbonate (OS-CAL) 1250 (500 Ca) MG chewable tablet Chew by mouth.   cariprazine (VRAYLAR) 1.5 MG capsule Take 1 capsule (1.5 mg total) by mouth daily.   Cholecalciferol 25 MCG (1000 UT) tablet Take 1 Units by mouth daily at 2 PM.   clobetasol (TEMOVATE) 0.05 % external solution APPLY TO AFFECTED AREA TWICE A DAY   clonazePAM (KLONOPIN) 0.5 MG tablet Take 1 tablet (0.5 mg total) by mouth 2 (two) times daily as needed for anxiety.   dicyclomine (BENTYL) 10 MG capsule Take 10 mg by mouth as needed for spasms.   EPINEPHrine 0.3 mg/0.3 mL IJ SOAJ injection Inject 0.3 mg into the muscle as needed for anaphylaxis.   fexofenadine (ALLEGRA) 180 MG tablet Take 1 tablet (180 mg total) by mouth daily.   Galcanezumab-gnlm 120 MG/ML SOAJ Inject into the skin. Emgality   hydrOXYzine (VISTARIL) 25 MG capsule Take 1-2 capsules (25-50 mg total) by mouth daily as needed.   LINZESS 72 MCG capsule Take 72 mcg by mouth daily.   Loratadine (CLARITIN PO) Take by mouth.   metoprolol tartrate (LOPRESSOR) 25 MG tablet Take 1 tablet (25 mg total) by mouth as needed.   montelukast (SINGULAIR) 10 MG tablet Take 1 tablet (10 mg total) by mouth daily.   Multiple Vitamin (MULTIVITAMIN) capsule Take by mouth.   norethindrone (MICRONOR) 0.35 MG tablet Take 1 tablet (0.35 mg total) by mouth daily.   nystatin-triamcinolone ointment (MYCOLOG) Apply 1 Application topically 2 (two) times daily.   Olopatadine HCl 0.2 % SOLN Apply 1 drop to eye every morning.   ondansetron (ZOFRAN-ODT) 4 MG disintegrating tablet DISSOLVE ON TONGUE AT ONSET OF NAUSEA- 30 DAY SUPPLY   pantoprazole (PROTONIX) 40 MG tablet Take 1 tablet by mouth in the morning and at bedtime.   RESTASIS 0.05 % ophthalmic emulsion 1 drop 2 (two) times daily.   sertraline (ZOLOFT) 50 MG tablet Take 1.5 tablets (75 mg total) by mouth daily.   SUMAtriptan (IMITREX) 100 MG tablet Take by mouth as needed.   topiramate  (TOPAMAX) 25 MG tablet Take 50 mg by mouth 2 (two) times daily.   VENTOLIN HFA 108 (90 Base) MCG/ACT inhaler INHALE 2 PUFFS INTO THE LUNGS EVERY 4 HOURS AS NEEDED FOR WHEEZING OR SHORTNESS OF BREATH.    Allergies  Allergen Reactions   Glucosamine Forte [Nutritional Supplements] Anaphylaxis    Due to containing shellfish   Nutritional Supplements Anaphylaxis   Oyster Extract Anaphylaxis   Shellfish-Derived Products Anaphylaxis    Throat swelling   Cats Claw (Uncaria Tomentosa) Nausea And Vomiting   Diamox [Acetazolamide] Other (See Comments)    Numbness- feet, hands, lips   Lactose    Lactose Intolerance (Gi)    Topiramate Other (See Comments)    Numbness to face, hands and feet   Bromfed Dm [Pseudoeph-Bromphen-Dm] Anxiety    CARBOFED DM      Review of Systems negative  except from HPI and PMH  Physical Exam BP (!) 86/58 (Patient Position: Standing)   Pulse 100   Ht 4\' 11"  (1.499 m)   Wt 229 lb 6.4 oz (104.1 kg)   LMP 05/03/2023 (Approximate)   SpO2 97%   BMI 46.33 kg/m  Well developed and well nourished in no acute distress Morbidly obese  HENT normal E scleral and icterus clear Neck Supple JVP flat; carotids brisk and full Clear to ausculation Regular rate and rhythm, no murmurs gallops or rub Soft with active bowel sounds No clubbing cyanosis  Edema Alert and oriented, grossly normal motor and sensory function Skin Warm and Dry  ECG sinus at 94 Interval 16/09/36  CrCl cannot be calculated (Patient's most recent lab result is older than the maximum 21 days allowed.).   Assessment and  Plan  Orthostatic intolerance   Hypertension   Atrial tachycardia   ASD repair   Morbid obesity   Depression-severe-better now   Asthma   Sleep disordered breathing   Vertigo   Blood pressures have been running low.  This may be partly related to her weight loss.  Strongly encouraged to continue.  Will decrease her bisoprolol from 10-7.5 with a target of 110-120.   The family is instructed to continue to we reduce it and 2.5 mg aliquots as necessary to achieve the above target blood pressure.  This may also help decrease issues with her dyspnea and her asthma  She will continue to work on weight loss.  I would hope that we can get to 150-175.  She is less groggy.  Her sleep issues may also improve with increased weight loss.  Will hold off on her sleep study.  Has repeat urinary testing pending to follow-up on the metanephrines.  Will defer to the other team.      Current medicines are reviewed at length with the patient today .  The patient does not*** have concerns regarding medicines.

## 2023-05-14 NOTE — Patient Instructions (Addendum)
Medication Instructions:  Your physician has recommended you make the following change in your medication:   ** Decrease Bisoprolol from 10mg  to 5mg  - 1.5 tablets daily by mouth (7.5mg )   *If you need a refill on your cardiac medications before your next appointment, please call your pharmacy*   Lab Work: None ordered.  If you have labs (blood work) drawn today and your tests are completely normal, you will receive your results only by: MyChart Message (if you have MyChart) OR A paper copy in the mail If you have any lab test that is abnormal or we need to change your treatment, we will call you to review the results.   Testing/Procedures: None ordered.    Follow-Up: At [facility], you and your health needs are our priority.  As part of our continuing mission to provide you with exceptional heart care, we have created designated Provider Care Teams.  These Care Teams include your primary Cardiologist (physician) and Advanced Practice Providers (APPs -  Physician Assistants and Nurse Practitioners) who all work together to provide you with the care you need, when you need it.  We recommend signing up for the patient portal called "MyChart".  Sign up information is provided on this After Visit Summary.  MyChart is used to connect with patients for Virtual Visits (Telemedicine).  Patients are able to view lab/test results, encounter notes, upcoming appointments, etc.  Non-urgent messages can be sent to your provider as well.   To learn more about what you can do with MyChart, go to [email or web address].    Your next appointment:   12 months with Dr Graciela Husbands

## 2023-05-16 ENCOUNTER — Other Ambulatory Visit: Payer: Self-pay | Admitting: Allergy & Immunology

## 2023-05-18 ENCOUNTER — Ambulatory Visit (INDEPENDENT_AMBULATORY_CARE_PROVIDER_SITE_OTHER): Payer: Medicaid Other | Admitting: Family Medicine

## 2023-05-18 ENCOUNTER — Encounter: Payer: Self-pay | Admitting: Family Medicine

## 2023-05-18 VITALS — BP 111/74 | HR 88 | Temp 97.9°F | Ht 59.0 in | Wt 228.4 lb

## 2023-05-18 DIAGNOSIS — Z0000 Encounter for general adult medical examination without abnormal findings: Secondary | ICD-10-CM

## 2023-05-18 DIAGNOSIS — Z0001 Encounter for general adult medical examination with abnormal findings: Secondary | ICD-10-CM | POA: Diagnosis not present

## 2023-05-18 DIAGNOSIS — R61 Generalized hyperhidrosis: Secondary | ICD-10-CM | POA: Diagnosis not present

## 2023-05-18 DIAGNOSIS — L02419 Cutaneous abscess of limb, unspecified: Secondary | ICD-10-CM | POA: Diagnosis not present

## 2023-05-18 DIAGNOSIS — Z6841 Body Mass Index (BMI) 40.0 and over, adult: Secondary | ICD-10-CM

## 2023-05-18 LAB — BAYER DCA HB A1C WAIVED: HB A1C (BAYER DCA - WAIVED): 5 % (ref 4.8–5.6)

## 2023-05-18 NOTE — Progress Notes (Signed)
Complete physical exam  Patient: Molly [name]   DOB: 10-24-2001   21 y.o. Female  MRN: [id number]  Subjective:    Chief Complaint  Patient presents with   Annual Exam   Axillary abscess     Patient was seen 5/29 and states it is still not closed   Rash    Left axillary on and off- needs referral to derm     Molly [name] is a 22 y.o. female who presents today for a complete physical exam. She reports consuming a general diet. The patient does not participate in regular exercise at present. She generally feels fairly well. She reports sleeping fairly well. She does have additional problems to discuss today.   She continues to have a rash to her left axilla and a healing abscess to her right axilla. States she has pain daily with ongoing fatigue. She does not exercise on a regular basis. She has a follow up with endocrinology followed for possible Cushing's syndrome. She would like a referral to derm for her continued rash and abscesses.   Most recent fall risk assessment:    04/27/2023   12:03 PM  Fall Risk   Falls in the past year? 0     Most recent depression screenings:    05/18/2023   11:40 AM 05/02/2023    2:22 PM  PHQ 2/9 Scores  PHQ - 2 Score 0 0  PHQ- 9 Score 1 0    Vision:Within last year and Dental: No current dental problems and Receives regular dental care  Patient Active Problem List   Diagnosis Date Noted   Prediabetes 05/10/2023   Axillary abscess 05/02/2023   LLQ pain 03/06/2023   PCO (polycystic ovaries) 03/06/2023   Encounter for menstrual regulation 03/06/2023   ASD (atrial septal defect) 11/22/2022   Morbid obesity (HCC) 10/11/2022   B12 deficiency 10/11/2022   Hyperhidrosis 10/11/2022   Palpitations 10/02/2022   Piriformis syndrome 08/11/2021   OSA (obstructive sleep apnea) 06/10/2020   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   Insomnia 06/10/2020   Sleep paralysis 06/10/2020   Dysmenorrhea 03/23/2020   Menorrhagia with  irregular cycle 03/23/2020   Fatigue 03/23/2020   ADHD (attention deficit hyperactivity disorder) 03/09/2020   Unexplained dysphagia 01/21/2020   Anxiety 10/23/2019   Eustachian tube dysfunction, left 10/23/2019   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 04/01/2019   Obesity without serious comorbidity with body mass index (BMI) in 95th to 98th percentile for age in pediatric patient 09/04/2018   PTSD (post-traumatic stress disorder) 03/07/2018   MDD (major depressive disorder), severe (HCC) 03/06/2018   Keratosis pilaris 01/15/2018   Severe persistent asthma, uncomplicated 01/15/2018   Intrinsic atopic dermatitis 01/15/2018   Seasonal and perennial allergic rhinitis 01/15/2018   Anaphylactic shock due to adverse food reaction 01/15/2018   Tic disorder 2019   Scoliosis 2019   Adverse food reaction 07/11/2016   Chronic migraine w/o aura w/o status migrainosus, not intractable 05/07/2012   Hyperlipidemia 2013   History of atrial septal defect repair 12/04/2011   Idiopathic urticaria 09/05/2011   Chronic constipation 09/05/2011   Hypertension 2011   Past Medical History:  Diagnosis Date   Acid reflux 2010   Acne vulgaris 2017   ADHD (attention deficit hyperactivity disorder) 2011   Adverse food reaction 07/11/2016   Shellfish allergy   Amenorrhea 2019   Anaphylactic shock due to adverse food reaction 01/15/2018   Anxiety 2017   ASD (atrial septal defect) 11/22/2022   Chronic constipation 09/05/2011  Chronic tension headaches 2011   originally followed by [facility] K.Griffin. Then Dr Alexis Goodell [facility] 03/2020 (see above)    Coalition, calcaneus navicular    Dysmenorrhea    Eczema    Eustachian tube dysfunction, left 10/23/2019   Frequent headaches    Galactorrhea    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 04/01/2019   Hiatal hernia    History of atrial septal defect repair 12/04/2011   Hyperlipidemia 2013   Hypertension 2011   Hypertension 09/28/2020   hypertension, cellulitus     Idiopathic urticaria 09/05/2011   Insomnia    Keratosis pilaris 01/15/2018   Mass of left thigh 11/22/2017   Overview:  Added automatically from request for surgery [id number]   MDD (major depressive disorder), severe (HCC) 03/06/2018   Migraine 2011   re-diagnosed as chronic migraines 03/2020 [facility] - starting on preventative    Obesity without serious comorbidity with body mass index (BMI) in 95th to 98th percentile for age in pediatric patient 09/04/2018   Piriformis syndrome    POTS (postural orthostatic tachycardia syndrome)    PTSD (post-traumatic stress disorder)    Scoliosis 2019   Seasonal and perennial allergic rhinitis 2010   Severe persistent asthma without complication 2010   Sleep paralysis    Suicidal ideation 03/07/2018   Tic disorder 2019   Past Surgical History:  Procedure Laterality Date   ASD REPAIR  2012   with Helix, via Cardiac Catheterization   atrial septal defecgt     CARDIAC SURGERY     DENTAL SURGERY  2020   lipoma removal  2019   LUMBAR PUNCTURE  05/20/2021   TONSILLECTOMY AND ADENOIDECTOMY  2010   Social History   Tobacco Use   Smoking status: Never   Smokeless tobacco: Never  Vaping Use   Vaping Use: Never used  Substance Use Topics   Alcohol use: No    Alcohol/week: 0.0 standard drinks of alcohol   Drug use: No   Social History   Socioeconomic History   Marital status: Single    Spouse name: Not on file   Number of children: Not on file   Years of education: Not on file   Highest education level: Not on file  Occupational History   Not on file  Tobacco Use   Smoking status: Never   Smokeless tobacco: Never  Vaping Use   Vaping Use: Never used  Substance and Sexual Activity   Alcohol use: No    Alcohol/week: 0.0 standard drinks of alcohol   Drug use: No   Sexual activity: Never    Birth control/protection: Pill  Other Topics Concern   Not on file  Social History Narrative   Not on file   Social Determinants of Health    Financial Resource Strain: Low Risk  (03/06/2023)   Overall Financial Resource Strain (CARDIA)    Difficulty of Paying Living Expenses: Not hard at all  Food Insecurity: No Food Insecurity (03/06/2023)   Hunger Vital Sign    Worried About Running Out of Food in the Last Year: Never true    Ran Out of Food in the Last Year: Never true  Transportation Needs: No Transportation Needs (03/06/2023)   PRAPARE - Administrator, Civil Service (Medical): No    Lack of Transportation (Non-Medical): No  Physical Activity: Inactive (03/06/2023)   Exercise Vital Sign    Days of Exercise per Week: 0 days    Minutes of Exercise per Session: 0 min  Stress: No  Stress Concern Present (03/06/2023)   [employer] of Occupational Health - Occupational Stress Questionnaire    Feeling of Stress : Only a little  Social Connections: Socially Isolated (03/06/2023)   Social Connection and Isolation Panel [NHANES]    Frequency of Communication with Friends and Family: Three times a week    Frequency of Social Gatherings with Friends and Family: Once a week    Attends Religious Services: Never    Database administrator or Organizations: No    Attends Banker Meetings: Never    Marital Status: Never married  Intimate Partner Violence: Not At Risk (03/06/2023)   Humiliation, Afraid, Rape, and Kick questionnaire    Fear of Current or Ex-Partner: No    Emotionally Abused: No    Physically Abused: No    Sexually Abused: No   Family Status  Relation Name Status   Mother  Alive   Father  Alive   Sister  Alive   Brother  Alive   MGM  Alive   MGF  Alive   PGM  Alive   PGF  Deceased   Neg Hx  (Not Specified)   Family History  Problem Relation Age of Onset   Hypertension Mother    Hyperlipidemia Mother    Diabetes Mother    Depression Mother    Arthritis Mother    Allergic rhinitis Mother    Asthma Mother    Bipolar disorder Mother    Anxiety disorder Mother    Mental illness Mother     Miscarriages / [place] Mother    Thyroid disease Mother    Bipolar disorder Father    Drug abuse Father    Alcohol abuse Father    ADD / ADHD Father    Mental illness Father    Allergic rhinitis Brother    Asthma Brother    ADD / ADHD Brother    Learning disabilities Brother    Hypertension Maternal Grandmother    Hyperlipidemia Maternal Grandmother    Heart disease Maternal Grandmother    Heart attack Maternal Grandfather    Bipolar disorder Maternal Grandfather    Angioedema Neg Hx    Eczema Neg Hx    Immunodeficiency Neg Hx    Urticaria Neg Hx    Allergies  Allergen Reactions   Glucosamine Forte [Nutritional Supplements] Anaphylaxis    Due to containing shellfish   Nutritional Supplements Anaphylaxis   Oyster Extract Anaphylaxis   Shellfish-Derived Products Anaphylaxis    Throat swelling   Cats Claw (Uncaria Tomentosa) Nausea And Vomiting   Diamox [Acetazolamide] Other (See Comments)    Numbness- feet, hands, lips   Lactose    Lactose Intolerance (Gi)    Topiramate Other (See Comments)    Numbness to face, hands and feet   Bromfed Dm [Pseudoeph-Bromphen-Dm] Anxiety    CARBOFED DM      Patient Care Team: Sonny Masters, FNP as PCP - General (Family Medicine) Dellis Anes, Hetty Ely, MD as Consulting Physician (Allergy and Immunology) Myrlene Broker, MD as Consulting Physician (Behavioral Health) Shaune Pollack, MD as Consulting Physician (Neurology) Alen Bleacher, Winferd Humphrey, MD as Consulting Physician (Pediatric Gastroenterology)   Outpatient Medications Prior to Visit  Medication Sig   albuterol (PROVENTIL) (2.5 MG/3ML) 0.083% nebulizer solution Take 3 mLs (2.5 mg total) by nebulization every 6 (six) hours as needed for wheezing or shortness of breath.   ascorbic acid (VITAMIN C) 500 MG tablet Take 500 mg by mouth daily.   atomoxetine (STRATTERA) 60  MG capsule Take 1 capsule (60 mg total) by mouth daily.   Azelastine-Fluticasone 137-50 MCG/ACT  SUSP Place 1 spray into both nostrils 2 (two) times daily.   bisoprolol (ZEBETA) 5 MG tablet Take 1.5 tablets (7.5 mg total) by mouth daily.   budesonide-formoterol (SYMBICORT) 160-4.5 MCG/ACT inhaler Inhale 2 puffs into the lungs in the morning and at bedtime.   busPIRone (BUSPAR) 10 MG tablet Take 1 tablet (10 mg total) by mouth 2 (two) times daily.   calcium carbonate (OS-CAL) 1250 (500 Ca) MG chewable tablet Chew by mouth.   cariprazine (VRAYLAR) 1.5 MG capsule Take 1 capsule (1.5 mg total) by mouth daily.   Cholecalciferol 25 MCG (1000 UT) tablet Take 1 Units by mouth daily at 2 PM.   clobetasol (TEMOVATE) 0.05 % external solution APPLY TO AFFECTED AREA TWICE A DAY   clonazePAM (KLONOPIN) 0.5 MG tablet Take 1 tablet (0.5 mg total) by mouth 2 (two) times daily as needed for anxiety.   dicyclomine (BENTYL) 10 MG capsule Take 10 mg by mouth as needed for spasms.   EPIPEN 2-PAK 0.3 MG/0.3ML SOAJ injection INJECT 0.3 MG INTO THE MUSCLE AS NEEDED FOR ANAPHYLAXIS.   fexofenadine (ALLEGRA) 180 MG tablet Take 1 tablet (180 mg total) by mouth daily.   Galcanezumab-gnlm 120 MG/ML SOAJ Inject into the skin. Emgality   hydrOXYzine (VISTARIL) 25 MG capsule Take 1-2 capsules (25-50 mg total) by mouth daily as needed.   LINZESS 72 MCG capsule Take 72 mcg by mouth daily.   Loratadine (CLARITIN PO) Take by mouth.   metoprolol tartrate (LOPRESSOR) 25 MG tablet Take 1 tablet (25 mg total) by mouth as needed.   montelukast (SINGULAIR) 10 MG tablet Take 1 tablet (10 mg total) by mouth daily.   Multiple Vitamin (MULTIVITAMIN) capsule Take by mouth.   norethindrone (MICRONOR) 0.35 MG tablet Take 1 tablet (0.35 mg total) by mouth daily.   nystatin-triamcinolone ointment (MYCOLOG) Apply 1 Application topically 2 (two) times daily.   Olopatadine HCl 0.2 % SOLN Apply 1 drop to eye every morning.   ondansetron (ZOFRAN-ODT) 4 MG disintegrating tablet DISSOLVE ON TONGUE AT ONSET OF NAUSEA- 30 DAY SUPPLY   pantoprazole  (PROTONIX) 40 MG tablet Take 1 tablet by mouth in the morning and at bedtime.   RESTASIS 0.05 % ophthalmic emulsion 1 drop 2 (two) times daily.   sertraline (ZOLOFT) 50 MG tablet Take 1.5 tablets (75 mg total) by mouth daily.   SUMAtriptan (IMITREX) 100 MG tablet Take by mouth as needed.   topiramate (TOPAMAX) 25 MG tablet Take 50 mg by mouth 2 (two) times daily.   VENTOLIN HFA 108 (90 Base) MCG/ACT inhaler INHALE 2 PUFFS BY MOUTH EVERY 4 HOURS AS NEEDED FOR WHEEZE OR FOR SHORTNESS OF BREATH   No facility-administered medications prior to visit.    Review of Systems  Constitutional:  Positive for diaphoresis and malaise/fatigue. Negative for chills, fever and weight loss.  Gastrointestinal:  Negative for abdominal pain, constipation, diarrhea and vomiting.  Genitourinary:  Negative for dysuria, flank pain, frequency, hematuria and urgency.  Musculoskeletal:  Positive for back pain, joint pain and myalgias. Negative for falls and neck pain.  Skin:  Positive for rash. Negative for itching.  Neurological:  Positive for headaches. Negative for dizziness, tingling, tremors, sensory change, speech change, focal weakness, seizures, loss of consciousness and weakness.  Psychiatric/Behavioral:  Positive for depression. Negative for hallucinations, memory loss, substance abuse and suicidal ideas. The patient is nervous/anxious and has insomnia.   All other  systems reviewed and are negative.         Objective:     BP 111/74   Pulse 88   Temp 97.9 F (36.6 C) (Temporal)   Ht 4\' 11"  (1.499 m)   Wt 228 lb 6.4 oz (103.6 kg)   LMP 05/03/2023 (Approximate)   SpO2 100%   BMI 46.13 kg/m  BP Readings from Last 3 Encounters:  05/18/23 111/74  05/14/23 (!) 86/58  05/10/23 120/82   Wt Readings from Last 3 Encounters:  05/18/23 228 lb 6.4 oz (103.6 kg) (99 %, Z= 2.31)*  05/14/23 229 lb 6.4 oz (104.1 kg) (99 %, Z= 2.32)*  05/10/23 231 lb (104.8 kg) (>99 %, Z= 2.33)*   * Growth percentiles are  based on CDC (Girls, 2-20 Years) data.   SpO2 Readings from Last 3 Encounters:  05/18/23 100%  05/14/23 97%  05/02/23 98%      Physical Exam Vitals and nursing note reviewed.  Constitutional:      General: She is not in acute distress.    Appearance: Normal appearance. She is well-developed and well-groomed. She is morbidly obese. She is not ill-appearing, toxic-appearing or diaphoretic.  HENT:     Head: Normocephalic and atraumatic.     Jaw: There is normal jaw occlusion.     Right Ear: Hearing normal.     Left Ear: Hearing normal.     Nose: Nose normal.     Mouth/Throat:     Lips: Pink.     Mouth: Mucous membranes are moist.     Pharynx: Oropharynx is clear. Uvula midline.  Eyes:     General: Lids are normal.     Extraocular Movements: Extraocular movements intact.     Conjunctiva/sclera: Conjunctivae normal.     Pupils: Pupils are equal, round, and reactive to light.  Neck:     Thyroid: No thyroid mass, thyromegaly or thyroid tenderness.     Vascular: No carotid bruit or JVD.     Trachea: Trachea and phonation normal.  Cardiovascular:     Rate and Rhythm: Normal rate and regular rhythm.     Chest Wall: PMI is not displaced.     Pulses: Normal pulses.     Heart sounds: Normal heart sounds. No murmur heard.    No friction rub. No gallop.  Pulmonary:     Effort: Pulmonary effort is normal. No respiratory distress.     Breath sounds: Normal breath sounds. No wheezing.  Abdominal:     General: Bowel sounds are normal. There is no distension or abdominal bruit.     Palpations: Abdomen is soft. There is no hepatomegaly or splenomegaly.     Tenderness: There is no abdominal tenderness. There is no right CVA tenderness or left CVA tenderness.     Hernia: No hernia is present.  Musculoskeletal:        General: Normal range of motion.     Cervical back: Normal range of motion and neck supple.     Right lower leg: No edema.     Left lower leg: No edema.  Lymphadenopathy:      Cervical: No cervical adenopathy.  Skin:    General: Skin is warm and dry.     Capillary Refill: Capillary refill takes less than 2 seconds.     Coloration: Skin is not cyanotic, jaundiced or pale.     Findings: Abscess (right axilla, healing) and rash (left axilla) present.     Comments: Multiple striae, dorsal fat pad to upper  thoracic, lower cervical spine.   Neurological:     General: No focal deficit present.     Mental Status: She is alert and oriented to person, place, and time.     Sensory: Sensation is intact.     Motor: Motor function is intact.     Coordination: Coordination is intact.     Gait: Gait is intact.     Deep Tendon Reflexes: Reflexes are normal and symmetric.  Psychiatric:        Attention and Perception: Attention and perception normal.        Mood and Affect: Mood and affect normal.        Speech: Speech normal.        Behavior: Behavior normal. Behavior is cooperative.        Thought Content: Thought content normal.        Cognition and Memory: Cognition and memory normal.        Judgment: Judgment normal.      No results found for any visits on 05/18/23. Last CBC Lab Results  Component Value Date   WBC 7.9 10/11/2022   HGB 13.6 10/11/2022   HCT 42.0 10/11/2022   MCV 94 10/11/2022   MCH 30.4 10/11/2022   RDW 13.0 10/11/2022   PLT 286 10/11/2022   Last metabolic panel Lab Results  Component Value Date   GLUCOSE 170 (H) 10/11/2022   NA 142 10/11/2022   K 4.0 10/11/2022   CL 107 (H) 10/11/2022   CO2 21 10/11/2022   BUN 5 (L) 10/11/2022   CREATININE 0.57 10/11/2022   EGFR 134 10/11/2022   CALCIUM 9.3 10/11/2022   PROT 6.1 10/11/2022   ALBUMIN 4.3 10/11/2022   LABGLOB 1.8 10/11/2022   AGRATIO 2.4 (H) 10/11/2022   BILITOT <0.2 10/11/2022   ALKPHOS 76 10/11/2022   AST 24 10/11/2022   ALT 34 (H) 10/11/2022   ANIONGAP 9 04/05/2018   Last lipids Lab Results  Component Value Date   CHOL 191 (H) 10/11/2022   HDL 37 (L) 10/11/2022    LDLCALC 121 (H) 10/11/2022   TRIG 188 (H) 10/11/2022   CHOLHDL 5.2 (H) 10/11/2022   Last hemoglobin A1c Lab Results  Component Value Date   HGBA1C 5.8 (H) 10/11/2022   Last thyroid functions Lab Results  Component Value Date   TSH 1.550 03/06/2022   Last vitamin D Lab Results  Component Value Date   VD25OH 35.6 10/11/2022   Last vitamin B12 and Folate Lab Results  Component Value Date   VITAMINB12 466 10/11/2022   FOLATE >20.0 04/04/2022        Assessment & Plan:    Routine Health Maintenance and Physical Exam  Immunization History  Administered Date(s) Administered   COVID-19, mRNA, vaccine(Comirnaty)12 years and older 11/17/2022   DTaP 09/24/2003, 12/10/2003, 02/16/2004, 11/01/2004, 07/23/2007, 07/09/2012   Dtap, Unspecified 11/01/2004, 07/23/2007   H1N1 10/07/2008, 11/12/2008   HIB (PRP-OMP) 09/24/2003, 12/10/2003, 02/16/2004, 11/01/2004, 07/23/2017   HIB (PRP-T) 09/24/2003, 12/10/2003, 02/16/2004   HIB, Unspecified 11/01/2004   HPV Quadrivalent 09/04/2013, 11/05/2013, 03/20/2014   Hep A, Unspecified 07/24/2006, 07/23/2007   Hep B, Unspecified 09/03/2003, 12/10/2003, 07/26/2004   IPV 09/24/2003, 12/10/2003, 11/01/2004, 07/23/2007   Influenza Nasal 08/10/2010, 09/03/2012, 09/04/2013, 09/14/2014   Influenza Split 09/05/2011, 09/05/2021   Influenza, Seasonal, Injecte, Preservative Fre 10/12/2005, 10/07/2008, 09/27/2009   Influenza,Quad,Nasal, Live 09/03/2012, 09/04/2013, 09/14/2014   Influenza,inj,Quad PF,6+ Mos 08/30/2015, 09/21/2016, 09/13/2017, 09/06/2018, 08/27/2019, 11/12/2020, 09/05/2021, 10/11/2022   Influenza,inj,quad, With Preservative 10/12/2018   Influenza-Unspecified 09/05/2011,  09/05/2011   MMR 07/26/2004, 07/23/2007   Meningococcal B, OMV 10/21/2019, 05/13/2020   Meningococcal Mcv4o 09/14/2014, 10/21/2019   PFIZER Comirnaty(Gray Top)Covid-19 Tri-Sucrose Vaccine 04/01/2021, 05/06/2021   Pneumococcal Conjugate PCV 7 09/24/2003, 12/10/2003,  02/16/2004, 11/01/2004   Pneumococcal Polysaccharide-23 07/09/2012, 03/06/2022   Pneumococcal-Unspecified 09/24/2003, 02/16/2004, 11/01/2004, 12/09/2006   Polio, Unspecified 11/01/2004, 07/23/2007   Tdap 09/14/2014   Varicella 07/26/2004, 07/23/2007    Health Maintenance  Topic Date Due   COVID-19 Vaccine (4 - 2023-24 season) 06/03/2023 (Originally 01/12/2023)   Hepatitis C Screening  07/12/2023 (Originally 07/21/2021)   INFLUENZA VACCINE  07/05/2023   DTaP/Tdap/Td (7 - Td or Tdap) 09/14/2024   HPV VACCINES  Completed   HIV Screening  Completed   CHLAMYDIA SCREENING  Discontinued    Discussed health benefits of physical activity, and encouraged her to engage in regular exercise appropriate for her age and condition.  Problem List Items Addressed This Visit       Musculoskeletal and Integument   Hyperhidrosis   Relevant Orders   Ambulatory referral to Dermatology   Thyroid Panel With TSH     Other   Axillary abscess   Relevant Orders   Ambulatory referral to Dermatology   Other Visit Diagnoses     Annual physical exam    -  Primary   Relevant Orders   Bayer DCA Hb A1c Waived   CBC with Differential/Platelet   CMP14+EGFR   Lipid panel   Thyroid Panel With TSH   VITAMIN D 25 Hydroxy (Vit-D Deficiency, Fractures)   Hepatitis C antibody   BMI 40.0-44.9, adult (HCC)       Relevant Orders   Bayer DCA Hb A1c Waived   CBC with Differential/Platelet   CMP14+EGFR   Lipid panel   Thyroid Panel With TSH   VITAMIN D 25 Hydroxy (Vit-D Deficiency, Fractures)      Return in about 1 year (around 05/17/2024), or if symptoms worsen or fail to improve, for CPE.   The above assessment and management plan was discussed with the patient. The patient verbalized understanding of and has agreed to the management plan. Patient is aware to call the clinic if they develop any new symptoms or if symptoms fail to improve or worsen. Patient is aware when to return to the clinic for a follow-up  visit. Patient   Kari Baars, FNP-C [facility] [address] [place], [place] [address] [phone]

## 2023-05-19 LAB — CMP14+EGFR
ALT: 40 IU/L — ABNORMAL HIGH (ref 0–32)
AST: 26 IU/L (ref 0–40)
Albumin: 4.6 g/dL (ref 4.0–5.0)
Alkaline Phosphatase: 96 IU/L (ref 42–106)
BUN/Creatinine Ratio: 10 (ref 9–23)
BUN: 8 mg/dL (ref 6–20)
Bilirubin Total: 0.4 mg/dL (ref 0.0–1.2)
CO2: 23 mmol/L (ref 20–29)
Calcium: 10 mg/dL (ref 8.7–10.2)
Chloride: 105 mmol/L (ref 96–106)
Creatinine, Ser: 0.78 mg/dL (ref 0.57–1.00)
Globulin, Total: 2 g/dL (ref 1.5–4.5)
Glucose: 99 mg/dL (ref 70–99)
Potassium: 3.7 mmol/L (ref 3.5–5.2)
Sodium: 144 mmol/L (ref 134–144)
Total Protein: 6.6 g/dL (ref 6.0–8.5)
eGFR: 112 mL/min/{1.73_m2} (ref >59)

## 2023-05-19 LAB — VITAMIN D 25 HYDROXY (VIT D DEFICIENCY, FRACTURES): Vit D, 25-Hydroxy: 60 ng/mL (ref 30.0–100.0)

## 2023-05-19 LAB — THYROID PANEL WITH TSH
Free Thyroxine Index: 2.1 (ref 1.2–4.9)
T3 Uptake Ratio: 28 % (ref 24–39)
T4, Total: 7.5 ug/dL (ref 4.5–12.0)
TSH: 3.27 u[IU]/mL (ref 0.450–4.500)

## 2023-05-19 LAB — CBC WITH DIFFERENTIAL/PLATELET
Basophils Absolute: 0 10*3/uL (ref 0.0–0.2)
Basos: 0 %
EOS (ABSOLUTE): 0.1 10*3/uL (ref 0.0–0.4)
Eos: 1 %
Hematocrit: 40.9 % (ref 34.0–46.6)
Hemoglobin: 13.5 g/dL (ref 11.1–15.9)
Immature Grans (Abs): 0 10*3/uL (ref 0.0–0.1)
Immature Granulocytes: 0 %
Lymphocytes Absolute: 1.4 10*3/uL (ref 0.7–3.1)
Lymphs: 20 %
MCH: 30.3 pg (ref 26.6–33.0)
MCHC: 33 g/dL (ref 31.5–35.7)
MCV: 92 fL (ref 79–97)
Monocytes Absolute: 0.4 10*3/uL (ref 0.1–0.9)
Monocytes: 6 %
Neutrophils Absolute: 5.2 10*3/uL (ref 1.4–7.0)
Neutrophils: 73 %
Platelets: 255 10*3/uL (ref 150–450)
RBC: 4.45 x10E6/uL (ref 3.77–5.28)
RDW: 13 % (ref 11.7–15.4)
WBC: 7.1 10*3/uL (ref 3.4–10.8)

## 2023-05-19 LAB — LIPID PANEL
Chol/HDL Ratio: 4 ratio (ref 0.0–4.4)
Cholesterol, Total: 174 mg/dL — ABNORMAL HIGH (ref 100–169)
HDL: 43 mg/dL (ref >39)
LDL Chol Calc (NIH): 108 mg/dL (ref 0–109)
Triglycerides: 128 mg/dL — ABNORMAL HIGH (ref 0–89)
VLDL Cholesterol Cal: 23 mg/dL (ref 5–40)

## 2023-05-19 LAB — HEPATITIS C ANTIBODY: Hep C Virus Ab: NONREACTIVE

## 2023-05-22 ENCOUNTER — Telehealth (INDEPENDENT_AMBULATORY_CARE_PROVIDER_SITE_OTHER): Payer: No Typology Code available for payment source | Admitting: Psychiatry

## 2023-05-22 ENCOUNTER — Encounter (HOSPITAL_COMMUNITY): Payer: Self-pay | Admitting: Psychiatry

## 2023-05-22 ENCOUNTER — Encounter: Payer: Self-pay | Admitting: Family Medicine

## 2023-05-22 DIAGNOSIS — F902 Attention-deficit hyperactivity disorder, combined type: Secondary | ICD-10-CM

## 2023-05-22 DIAGNOSIS — F333 Major depressive disorder, recurrent, severe with psychotic symptoms: Secondary | ICD-10-CM | POA: Diagnosis not present

## 2023-05-22 MED ORDER — ATOMOXETINE HCL 60 MG PO CAPS: 60.0000 mg | ORAL_CAPSULE | Freq: Every day | ORAL | 2 refills | Status: DC

## 2023-05-22 MED ORDER — SERTRALINE HCL 50 MG PO TABS: 75.0000 mg | ORAL_TABLET | Freq: Every day | ORAL | 2 refills | Status: DC

## 2023-05-22 MED ORDER — CLONAZEPAM 0.5 MG PO TABS: 0.5000 mg | ORAL_TABLET | Freq: Two times a day (BID) | ORAL | 2 refills | Status: DC | PRN

## 2023-05-22 MED ORDER — BUSPIRONE HCL 10 MG PO TABS: 10.0000 mg | ORAL_TABLET | Freq: Two times a day (BID) | ORAL | 2 refills | Status: DC

## 2023-05-22 MED ORDER — CARIPRAZINE HCL 1.5 MG PO CAPS: 1.5000 mg | ORAL_CAPSULE | Freq: Every day | ORAL | 2 refills | Status: DC

## 2023-05-22 NOTE — Progress Notes (Signed)
BH MD/PA/NP OP Progress Note  05/22/2023 1:40 PM Molly [name]  MRN:  [id number]  Chief Complaint:  Chief Complaint  Patient presents with   Depression   Anxiety   Follow-up   HPI: This patient is a 22 year old female who lives with her mother mother's fianc and a younger brother in [place].  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  The patient returns for follow-up after 3 months.  For the most part she is doing better.  She is sad because she recently lost her great grandfather in May due to a stroke.  Her family is very close.  She is doing well in her class.  She is repeating it right now and doing much better.  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  She states her mood has been stable and she denies significant depression or anxiety.  Her health has been overall better.  She changed birth control pills and has lost weight and is overall feeling better and having less orthostasis. Visit Diagnosis:    ICD-10-CM   1. Severe episode of recurrent major depressive disorder, with psychotic features (HCC)  F33.3     2. Attention deficit hyperactivity disorder (ADHD), combined type  F90.2       Past Psychiatric History: Past psychiatric hospitalizations of a younger age, 2 [facility]  Past Medical History:  Past Medical History:  Diagnosis Date   Acid reflux 2010   Acne vulgaris 2017   ADHD (attention deficit hyperactivity disorder) 2011   Adverse food reaction 07/11/2016   Shellfish allergy   Amenorrhea 2019   Anaphylactic shock due to adverse food reaction 01/15/2018   Anxiety 2017   ASD (atrial septal defect) 11/22/2022   Chronic constipation 09/05/2011   Chronic tension headaches 2011   originally followed by [facility] K.Griffin. Then Dr Alexis Goodell [facility] 03/2020 (see above)     Coalition, calcaneus navicular    Dysmenorrhea    Eczema    Eustachian tube dysfunction, left 10/23/2019   Frequent headaches    Galactorrhea    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 04/01/2019   Hiatal hernia    History of atrial septal defect repair 12/04/2011   Hyperlipidemia 2013   Hypertension 2011   Hypertension 09/28/2020   hypertension, cellulitus    Idiopathic urticaria 09/05/2011   Insomnia    Keratosis pilaris 01/15/2018   Mass of left thigh 11/22/2017   Overview:  Added automatically from request for surgery [id number]   MDD (major depressive disorder), severe (HCC) 03/06/2018   Migraine 2011   re-diagnosed as chronic migraines 03/2020 [facility] - starting on preventative    Obesity without serious comorbidity with body mass index (BMI) in 95th to 98th percentile for age in pediatric patient 09/04/2018   Piriformis syndrome    POTS (postural orthostatic tachycardia syndrome)    PTSD (post-traumatic stress disorder)    Scoliosis 2019   Seasonal and perennial allergic rhinitis 2010   Severe persistent asthma without complication 2010   Sleep paralysis    Suicidal ideation 03/07/2018   Tic disorder 2019    Past Surgical History:  Procedure Laterality Date   ASD REPAIR  2012   with Helix, via Cardiac Catheterization   atrial septal defecgt     CARDIAC SURGERY  DENTAL SURGERY  2020   lipoma removal  2019   LUMBAR PUNCTURE  05/20/2021   TONSILLECTOMY AND ADENOIDECTOMY  2010    Family Psychiatric History: See below  Family History:  Family History  Problem Relation Age of Onset   Hypertension Mother    Hyperlipidemia Mother    Diabetes Mother    Depression Mother    Arthritis Mother    Allergic rhinitis Mother    Asthma Mother    Bipolar disorder Mother    Anxiety disorder Mother    Mental illness Mother    Miscarriages / [place] Mother    Thyroid disease Mother    Bipolar disorder Father    Drug abuse Father    Alcohol abuse Father    ADD / ADHD  Father    Mental illness Father    Allergic rhinitis Brother    Asthma Brother    ADD / ADHD Brother    Learning disabilities Brother    Hypertension Maternal Grandmother    Hyperlipidemia Maternal Grandmother    Heart disease Maternal Grandmother    Heart attack Maternal Grandfather    Bipolar disorder Maternal Grandfather    Angioedema Neg Hx    Eczema Neg Hx    Immunodeficiency Neg Hx    Urticaria Neg Hx     Social History:  Social History   Socioeconomic History   Marital status: Single    Spouse name: Not on file   Number of children: Not on file   Years of education: Not on file   Highest education level: Not on file  Occupational History   Not on file  Tobacco Use   Smoking status: Never   Smokeless tobacco: Never  Vaping Use   Vaping Use: Never used  Substance and Sexual Activity   Alcohol use: No    Alcohol/week: 0.0 standard drinks of alcohol   Drug use: No   Sexual activity: Never    Birth control/protection: Pill  Other Topics Concern   Not on file  Social History Narrative   Not on file   Social Determinants of Health   Financial Resource Strain: Low Risk  (03/06/2023)   Overall Financial Resource Strain (CARDIA)    Difficulty of Paying Living Expenses: Not hard at all  Food Insecurity: No Food Insecurity (03/06/2023)   Hunger Vital Sign    Worried About Running Out of Food in the Last Year: Never true    Ran Out of Food in the Last Year: Never true  Transportation Needs: No Transportation Needs (03/06/2023)   PRAPARE - Administrator, Civil Service (Medical): No    Lack of Transportation (Non-Medical): No  Physical Activity: Inactive (03/06/2023)   Exercise Vital Sign    Days of Exercise per Week: 0 days    Minutes of Exercise per Session: 0 min  Stress: No Stress Concern Present (03/06/2023)   [employer] of Occupational Health - Occupational Stress Questionnaire    Feeling of Stress : Only a little  Social Connections: Socially  Isolated (03/06/2023)   Social Connection and Isolation Panel [NHANES]    Frequency of Communication with Friends and Family: Three times a week    Frequency of Social Gatherings with Friends and Family: Once a week    Attends Religious Services: Never    Database administrator or Organizations: No    Attends Banker Meetings: Never    Marital Status: Never married    Allergies:  Allergies  Allergen Reactions  Glucosamine Forte [Nutritional Supplements] Anaphylaxis    Due to containing shellfish   Nutritional Supplements Anaphylaxis   Oyster Extract Anaphylaxis   Shellfish-Derived Products Anaphylaxis    Throat swelling   Cats Claw (Uncaria Tomentosa) Nausea And Vomiting   Diamox [Acetazolamide] Other (See Comments)    Numbness- feet, hands, lips   Lactose    Lactose Intolerance (Gi)    Topiramate Other (See Comments)    Numbness to face, hands and feet   Bromfed Dm [Pseudoeph-Bromphen-Dm] Anxiety    CARBOFED DM    Metabolic Disorder Labs: Lab Results  Component Value Date   HGBA1C 5.0 05/18/2023   MPG 105.41 03/07/2018   Lab Results  Component Value Date   PROLACTIN 49.7 (H) 03/07/2018   Lab Results  Component Value Date   CHOL 174 (H) 05/18/2023   TRIG 128 (H) 05/18/2023   HDL 43 05/18/2023   CHOLHDL 4.0 05/18/2023   VLDL 20 03/07/2018   LDLCALC 108 05/18/2023   LDLCALC 121 (H) 10/11/2022   Lab Results  Component Value Date   TSH 3.270 05/18/2023   TSH 1.550 03/06/2022    Therapeutic Level Labs: No results found for: "LITHIUM" No results found for: "VALPROATE" No results found for: "CBMZ"  Current Medications: Current Outpatient Medications  Medication Sig Dispense Refill   albuterol (PROVENTIL) (2.5 MG/3ML) 0.083% nebulizer solution Take 3 mLs (2.5 mg total) by nebulization every 6 (six) hours as needed for wheezing or shortness of breath. 75 mL 1   ascorbic acid (VITAMIN C) 500 MG tablet Take 500 mg by mouth daily.     atomoxetine  (STRATTERA) 60 MG capsule Take 1 capsule (60 mg total) by mouth daily. 30 capsule 2   Azelastine-Fluticasone 137-50 MCG/ACT SUSP Place 1 spray into both nostrils 2 (two) times daily. 23 g 5   bisoprolol (ZEBETA) 5 MG tablet Take 1.5 tablets (7.5 mg total) by mouth daily. 135 tablet 3   budesonide-formoterol (SYMBICORT) 160-4.5 MCG/ACT inhaler Inhale 2 puffs into the lungs in the morning and at bedtime. 1 each 5   busPIRone (BUSPAR) 10 MG tablet Take 1 tablet (10 mg total) by mouth 2 (two) times daily. 60 tablet 2   calcium carbonate (OS-CAL) 1250 (500 Ca) MG chewable tablet Chew by mouth.     cariprazine (VRAYLAR) 1.5 MG capsule Take 1 capsule (1.5 mg total) by mouth daily. 30 capsule 2   Cholecalciferol 25 MCG (1000 UT) tablet Take 1 Units by mouth daily at 2 PM.     clobetasol (TEMOVATE) 0.05 % external solution APPLY TO AFFECTED AREA TWICE A DAY 50 mL 3   clonazePAM (KLONOPIN) 0.5 MG tablet Take 1 tablet (0.5 mg total) by mouth 2 (two) times daily as needed for anxiety. 60 tablet 2   dicyclomine (BENTYL) 10 MG capsule Take 10 mg by mouth as needed for spasms.     EPIPEN 2-PAK 0.3 MG/0.3ML SOAJ injection INJECT 0.3 MG INTO THE MUSCLE AS NEEDED FOR ANAPHYLAXIS. 2 each 2   fexofenadine (ALLEGRA) 180 MG tablet Take 1 tablet (180 mg total) by mouth daily. 30 tablet 5   Galcanezumab-gnlm 120 MG/ML SOAJ Inject into the skin. Emgality     hydrOXYzine (VISTARIL) 25 MG capsule Take 1-2 capsules (25-50 mg total) by mouth daily as needed. 60 capsule 2   LINZESS 72 MCG capsule Take 72 mcg by mouth daily.     Loratadine (CLARITIN PO) Take by mouth.     metoprolol tartrate (LOPRESSOR) 25 MG tablet Take 1 tablet (25  mg total) by mouth as needed. 15 tablet 1   montelukast (SINGULAIR) 10 MG tablet Take 1 tablet (10 mg total) by mouth daily. 30 tablet 5   Multiple Vitamin (MULTIVITAMIN) capsule Take by mouth.     norethindrone (MICRONOR) 0.35 MG tablet Take 1 tablet (0.35 mg total) by mouth daily. 28 tablet 11    nystatin-triamcinolone ointment (MYCOLOG) Apply 1 Application topically 2 (two) times daily. 30 g 0   Olopatadine HCl 0.2 % SOLN Apply 1 drop to eye every morning.     ondansetron (ZOFRAN-ODT) 4 MG disintegrating tablet DISSOLVE ON TONGUE AT ONSET OF NAUSEA- 30 DAY SUPPLY 10 tablet 1   pantoprazole (PROTONIX) 40 MG tablet Take 1 tablet by mouth in the morning and at bedtime.     RESTASIS 0.05 % ophthalmic emulsion 1 drop 2 (two) times daily.     sertraline (ZOLOFT) 50 MG tablet Take 1.5 tablets (75 mg total) by mouth daily. 45 tablet 2   SUMAtriptan (IMITREX) 100 MG tablet Take by mouth as needed.     topiramate (TOPAMAX) 25 MG tablet Take 50 mg by mouth 2 (two) times daily.     VENTOLIN HFA 108 (90 Base) MCG/ACT inhaler INHALE 2 PUFFS BY MOUTH EVERY 4 HOURS AS NEEDED FOR WHEEZE OR FOR SHORTNESS OF BREATH 18 each 0   No current facility-administered medications for this visit.     Musculoskeletal: Strength & Muscle Tone: within normal limits Gait & Station: normal Patient leans: N/A  Psychiatric Specialty Exam: Review of Systems  All other systems reviewed and are negative.   Last menstrual period 05/03/2023.There is no height or weight on file to calculate BMI.  General Appearance: Casual and Fairly Groomed  Eye Contact:  Good  Speech:  Clear and Coherent  Volume:  Normal  Mood:  Euthymic  Affect:  Congruent  Thought Process:  Goal Directed  Orientation:  Full (Time, Place, and Person)  Thought Content: WDL   Suicidal Thoughts:  No  Homicidal Thoughts:  No  Memory:  Immediate;   Good Recent;   Good Remote;   Fair  Judgement:  Good  Insight:  Fair  Psychomotor Activity:  Normal  Concentration:  Concentration: Good and Attention Span: Good  Recall:  Good  Fund of Knowledge: Good  Language: Good  Akathisia:  No  Handed:  Right  AIMS (if indicated): not done  Assets:  Communication Skills Desire for Improvement Resilience Social Support Talents/Skills  ADL's:   Intact  Cognition: WNL  Sleep:  Good   Screenings: GAD-7    Flowsheet Row Office Visit from 05/18/2023 in [facility] Office Visit from 05/02/2023 in [facility] Office Visit from 04/27/2023 in [facility] Office Visit from 03/06/2023 in [facility] Office Visit from 01/12/2023 in [facility]  Total GAD-7 Score 0 0 0 0 1      PHQ2-9    Flowsheet Row Office Visit from 05/18/2023 in [facility] Office Visit from 05/02/2023 in [facility] Office Visit from 04/27/2023 in [facility] Office Visit from 03/06/2023 in [facility] Office Visit from 01/12/2023 in [facility]  PHQ-2 Total Score 0 0 0 0 1  PHQ-9 Total Score 1 0 3 3 2  Flowsheet Row Video Visit from 08/22/2022 in [facility] [place] Video Visit from 06/23/2022 in [facility] [place] Video Visit from 04/19/2022 in [facility] at   C-SSRS RISK CATEGORY No Risk No Risk No Risk        Assessment and Plan: This patient is a 22 year old female with a history of depression anxiety possible bipolar disorder sleep difficulties ADD fibromyalgia chronic fatigue POTS and possible sleep apnea.  Currently her symptoms seem to be well-controlled.  She will continue Strattera 60 mg daily for focus, Vraylar 1.5 mg daily for mood stabilization, Zoloft 75 mg daily for depression, BuSpar 10 mg twice daily for anxiety, hydroxyzine 25 to 50 mg at bedtime as needed for sleep and clonazepam 0.5 mg as needed for anxiety.  She will return to see me in 3 months  Collaboration of Care: Collaboration of  Care: Primary Care Provider AEB notes are shared with PCP on the epic system  Patient/Guardian was advised Release of Information must be obtained prior to any record release in order to collaborate their care with an outside provider. Patient/Guardian was advised if they have not already done so to contact the registration department to sign all necessary forms in order for [place] to release information regarding their care.   Consent: Patient/Guardian gives verbal consent for treatment and assignment of benefits for services provided during this visit. Patient/Guardian expressed understanding and agreed to proceed.    [name], MD 05/22/2023, 1:40 PM

## 2023-05-24 ENCOUNTER — Ambulatory Visit (INDEPENDENT_AMBULATORY_CARE_PROVIDER_SITE_OTHER): Payer: Medicaid Other | Admitting: Adult Health

## 2023-05-24 ENCOUNTER — Encounter: Payer: Self-pay | Admitting: Adult Health

## 2023-05-24 VITALS — BP 109/81 | HR 98 | Ht 59.0 in | Wt 227.2 lb

## 2023-05-24 DIAGNOSIS — Z7689 Persons encountering health services in other specified circumstances: Secondary | ICD-10-CM | POA: Diagnosis not present

## 2023-05-24 DIAGNOSIS — E282 Polycystic ovarian syndrome: Secondary | ICD-10-CM

## 2023-05-24 NOTE — Progress Notes (Signed)
  Subjective:     Patient ID: Molly [name], female   DOB: November 22, 2001, 22 y.o.   MRN: [id number]  HPI Molly [name] is a 22 year old white female,single, G0P0, in for follow up on starting Micronor, in April, she has had some spotting and clots. She has lost about 20 lbs since then too. She feels less heated now.  PCP is Gilford Silvius NP Review of Systems Spotting some on Micronor Less heated Has lost weight Not having sex  Reviewed past medical,surgical, social and family history. Reviewed medications and allergies.     Objective:   Physical Exam BP 109/81 (BP Location: Right Arm, Patient Position: Sitting, Cuff Size: Normal)   Pulse 98   Ht 4\' 11"  (1.499 m)   Wt 227 lb 3.2 oz (103.1 kg)   LMP 05/22/2023 (Approximate)   BMI 45.89 kg/m     Skin warm and dry.Lungs: clear to ausculation bilaterally. Cardiovascular: regular rate and rhythm.   Upstream - 05/24/23 1050       Pregnancy Intention Screening   Does the patient want to become pregnant in the next year? No    Does the patient's partner want to become pregnant in the next year? No    Would the patient like to discuss contraceptive options today? No      Contraception Wrap Up   Current Method Oral Contraceptive    End Method Oral Contraceptive    Contraception Counseling Provided No             Assessment:     1. Encounter for menstrual regulation Spotting some with micronor, will continue has refills   2. PCO (polycystic ovaries) Has [place] 07/04/23 to assess ovaries  3. Morbid obesity (HCC) Has lost 20 lbs Continue weight loss efforts      Plan:     Follow up 07/04/23 after [place]

## 2023-05-29 ENCOUNTER — Other Ambulatory Visit: Payer: Medicaid Other

## 2023-05-30 LAB — LIPID PANEL
LDL Chol Calc (NIH): 113 mg/dL — ABNORMAL HIGH (ref 0–109)
VLDL Cholesterol Cal: 24 mg/dL (ref 5–40)

## 2023-05-30 LAB — CREATININE, URINE, 24 HOUR
Creatinine, 24H Ur: 959 mg/24 hr (ref 800–1800)
Creatinine, Urine: 95.9 mg/dL

## 2023-05-30 LAB — 5 HIAA W/CREATININE, 24 HR: Creatinine, Urine: 101.5 mg/dL

## 2023-05-30 LAB — METANEPHRINES, PLASMA

## 2023-06-03 DIAGNOSIS — J4550 Severe persistent asthma, uncomplicated: Secondary | ICD-10-CM | POA: Diagnosis not present

## 2023-06-04 ENCOUNTER — Encounter: Payer: Self-pay | Admitting: Family Medicine

## 2023-06-04 LAB — CORTISOL, URINE, FREE
Cortisol (Ur), Free: 12 ug/24 hr (ref 6–42)
Cortisol,F,ug/L,U: 12 ug/L

## 2023-06-06 LAB — LIPID PANEL
Cholesterol, Total: 181 mg/dL — ABNORMAL HIGH (ref 100–169)
Triglycerides: 134 mg/dL — ABNORMAL HIGH (ref 0–89)

## 2023-06-06 LAB — TSH: TSH: 4.79 u[IU]/mL — ABNORMAL HIGH (ref 0.450–4.500)

## 2023-06-07 LAB — METANEPHRINES, PLASMA: Metanephrine, Free: <25 pg/mL (ref 0.0–88.0)

## 2023-06-07 LAB — T4, FREE: Free T4: 1.07 ng/dL (ref 0.93–1.60)

## 2023-06-07 LAB — 5 HIAA W/CREATININE, 24 HR: Creatinine, 24H Ur: 1015 mg/24 hr (ref 800–1800)

## 2023-06-07 LAB — LIPID PANEL
Chol/HDL Ratio: 4.1 ratio (ref 0.0–4.4)
HDL: 44 mg/dL (ref >39)

## 2023-06-07 LAB — T3, FREE: T3, Free: 3.2 pg/mL (ref 2.3–5.0)

## 2023-06-12 ENCOUNTER — Ambulatory Visit: Payer: Medicaid Other | Admitting: Adult Health

## 2023-06-12 ENCOUNTER — Other Ambulatory Visit: Payer: Medicaid Other

## 2023-06-20 ENCOUNTER — Encounter: Payer: Self-pay | Admitting: "Endocrinology

## 2023-06-20 ENCOUNTER — Ambulatory Visit: Payer: MEDICAID | Admitting: "Endocrinology

## 2023-06-20 VITALS — BP 106/66 | HR 88 | Ht 59.0 in | Wt 229.4 lb

## 2023-06-20 DIAGNOSIS — I10 Essential (primary) hypertension: Secondary | ICD-10-CM | POA: Diagnosis not present

## 2023-06-20 DIAGNOSIS — R7303 Prediabetes: Secondary | ICD-10-CM

## 2023-06-20 DIAGNOSIS — E039 Hypothyroidism, unspecified: Secondary | ICD-10-CM

## 2023-06-20 DIAGNOSIS — R61 Generalized hyperhidrosis: Secondary | ICD-10-CM | POA: Diagnosis not present

## 2023-06-20 DIAGNOSIS — E782 Mixed hyperlipidemia: Secondary | ICD-10-CM

## 2023-06-20 LAB — POCT UA - MICROALBUMIN
Creatinine, POC: 100 mg/dL
Microalbumin Ur, POC: 150 mg/L

## 2023-06-20 MED ORDER — LEVOTHYROXINE SODIUM 25 MCG PO TABS
25.0000 ug | ORAL_TABLET | Freq: Every day | ORAL | 3 refills | Status: DC
Start: 2023-06-20 — End: 2023-10-11

## 2023-06-20 NOTE — Progress Notes (Signed)
06/20/2023, 7:01 PM  Endocrinology follow-up note   Subjective:    Patient ID: Molly [name], female    DOB: 2001/11/11, PCP Sonny Masters, FNP   Past Medical History:  Diagnosis Date   Acid reflux 2010   Acne vulgaris 2017   ADHD (attention deficit hyperactivity disorder) 2011   Adverse food reaction 07/11/2016   Shellfish allergy   Amenorrhea 2019   Anaphylactic shock due to adverse food reaction 01/15/2018   Anxiety 2017   ASD (atrial septal defect) 11/22/2022   Chronic constipation 09/05/2011   Chronic tension headaches 2011   originally followed by [facility] K.Griffin. Then Dr Alexis Goodell [facility] 03/2020 (see above)    Coalition, calcaneus navicular    Dysmenorrhea    Eczema    Eustachian tube dysfunction, left 10/23/2019   Frequent headaches    Galactorrhea    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 04/01/2019   Hiatal hernia    History of atrial septal defect repair 12/04/2011   Hyperlipidemia 2013   Hypertension 2011   Hypertension 09/28/2020   hypertension, cellulitus    Idiopathic urticaria 09/05/2011   Insomnia    Keratosis pilaris 01/15/2018   Mass of left thigh 11/22/2017   Overview:  Added automatically from request for surgery [id number]   MDD (major depressive disorder), severe (HCC) 03/06/2018   Migraine 2011   re-diagnosed as chronic migraines 03/2020 [facility] - starting on preventative    Obesity without serious comorbidity with body mass index (BMI) in 95th to 98th percentile for age in pediatric patient 09/04/2018   Piriformis syndrome    POTS (postural orthostatic tachycardia syndrome)    PTSD (post-traumatic stress disorder)    Scoliosis 2019   Seasonal and perennial allergic rhinitis 2010   Severe persistent asthma without complication 2010   Sleep paralysis    Suicidal ideation 03/07/2018   Tic disorder 2019   Past Surgical History:  Procedure Laterality Date   ASD REPAIR  2012   with  Helix, via Cardiac Catheterization   atrial septal defecgt     CARDIAC SURGERY     DENTAL SURGERY  2020   lipoma removal  2019   LUMBAR PUNCTURE  05/20/2021   TONSILLECTOMY AND ADENOIDECTOMY  2010   Social History   Socioeconomic History   Marital status: Single    Spouse name: Not on file   Number of children: Not on file   Years of education: Not on file   Highest education level: Not on file  Occupational History   Not on file  Tobacco Use   Smoking status: Never   Smokeless tobacco: Never  Vaping Use   Vaping status: Never Used  Substance and Sexual Activity   Alcohol use: No    Alcohol/week: 0.0 standard drinks of alcohol   Drug use: No   Sexual activity: Never    Birth control/protection: Pill  Other Topics Concern   Not on file  Social History Narrative   Not on file   Social Determinants of Health   Financial Resource Strain: Low Risk  (03/06/2023)   Overall Financial Resource Strain (CARDIA)    Difficulty of Paying Living Expenses: Not hard at all  Food Insecurity: No Food Insecurity (  03/06/2023)   Hunger Vital Sign    Worried About Running Out of Food in the Last Year: Never true    Ran Out of Food in the Last Year: Never true  Transportation Needs: No Transportation Needs (03/06/2023)   PRAPARE - Administrator, Civil Service (Medical): No    Lack of Transportation (Non-Medical): No  Physical Activity: Inactive (03/06/2023)   Exercise Vital Sign    Days of Exercise per Week: 0 days    Minutes of Exercise per Session: 0 min  Stress: No Stress Concern Present (03/06/2023)   [employer] of Occupational Health - Occupational Stress Questionnaire    Feeling of Stress : Only a little  Social Connections: Socially Isolated (03/06/2023)   Social Connection and Isolation Panel [NHANES]    Frequency of Communication with Friends and Family: Three times a week    Frequency of Social Gatherings with Friends and Family: Once a week    Attends Religious  Services: Never    Database administrator or Organizations: No    Attends Engineer, structural: Never    Marital Status: Never married   Family History  Problem Relation Age of Onset   Hypertension Mother    Hyperlipidemia Mother    Diabetes Mother    Depression Mother    Arthritis Mother    Allergic rhinitis Mother    Asthma Mother    Bipolar disorder Mother    Anxiety disorder Mother    Mental illness Mother    Miscarriages / [place] Mother    Thyroid disease Mother    Bipolar disorder Father    Drug abuse Father    Alcohol abuse Father    ADD / ADHD Father    Mental illness Father    Allergic rhinitis Brother    Asthma Brother    ADD / ADHD Brother    Learning disabilities Brother    Hypertension Maternal Grandmother    Hyperlipidemia Maternal Grandmother    Heart disease Maternal Grandmother    Heart attack Maternal Grandfather    Bipolar disorder Maternal Grandfather    Angioedema Neg Hx    Eczema Neg Hx    Immunodeficiency Neg Hx    Urticaria Neg Hx    Outpatient Encounter Medications as of 06/20/2023  Medication Sig   levothyroxine (SYNTHROID) 25 MCG tablet Take 1 tablet (25 mcg total) by mouth daily.   albuterol (PROVENTIL) (2.5 MG/3ML) 0.083% nebulizer solution Take 3 mLs (2.5 mg total) by nebulization every 6 (six) hours as needed for wheezing or shortness of breath.   ascorbic acid (VITAMIN C) 500 MG tablet Take 500 mg by mouth daily.   atomoxetine (STRATTERA) 60 MG capsule Take 1 capsule (60 mg total) by mouth daily.   Azelastine-Fluticasone 137-50 MCG/ACT SUSP Place 1 spray into both nostrils 2 (two) times daily.   bisoprolol (ZEBETA) 5 MG tablet Take 1.5 tablets (7.5 mg total) by mouth daily.   budesonide-formoterol (SYMBICORT) 160-4.5 MCG/ACT inhaler Inhale 2 puffs into the lungs in the morning and at bedtime.   busPIRone (BUSPAR) 10 MG tablet Take 1 tablet (10 mg total) by mouth 2 (two) times daily.   calcium carbonate (OS-CAL) 1250 (500 Ca)  MG chewable tablet Chew by mouth.   cariprazine (VRAYLAR) 1.5 MG capsule Take 1 capsule (1.5 mg total) by mouth daily.   Cholecalciferol 25 MCG (1000 UT) tablet Take 1 Units by mouth daily at 2 PM.   clobetasol (TEMOVATE) 0.05 % external solution APPLY TO  AFFECTED AREA TWICE A DAY   clonazePAM (KLONOPIN) 0.5 MG tablet Take 1 tablet (0.5 mg total) by mouth 2 (two) times daily as needed for anxiety.   dicyclomine (BENTYL) 10 MG capsule Take 10 mg by mouth as needed for spasms.   EPIPEN 2-PAK 0.3 MG/0.3ML SOAJ injection INJECT 0.3 MG INTO THE MUSCLE AS NEEDED FOR ANAPHYLAXIS.   fexofenadine (ALLEGRA) 180 MG tablet Take 1 tablet (180 mg total) by mouth daily.   Galcanezumab-gnlm 120 MG/ML SOAJ Inject into the skin. Emgality   hydrOXYzine (VISTARIL) 25 MG capsule Take 1-2 capsules (25-50 mg total) by mouth daily as needed.   LINZESS 72 MCG capsule Take 72 mcg by mouth daily.   Loratadine (CLARITIN PO) Take by mouth.   metoprolol tartrate (LOPRESSOR) 25 MG tablet Take 1 tablet (25 mg total) by mouth as needed.   montelukast (SINGULAIR) 10 MG tablet Take 1 tablet (10 mg total) by mouth daily.   Multiple Vitamin (MULTIVITAMIN) capsule Take by mouth.   norethindrone (MICRONOR) 0.35 MG tablet Take 1 tablet (0.35 mg total) by mouth daily.   nystatin-triamcinolone ointment (MYCOLOG) Apply 1 Application topically 2 (two) times daily.   Olopatadine HCl 0.2 % SOLN Apply 1 drop to eye every morning.   ondansetron (ZOFRAN-ODT) 4 MG disintegrating tablet DISSOLVE ON TONGUE AT ONSET OF NAUSEA- 30 DAY SUPPLY   pantoprazole (PROTONIX) 40 MG tablet Take 1 tablet by mouth in the morning and at bedtime.   RESTASIS 0.05 % ophthalmic emulsion 1 drop 2 (two) times daily.   sertraline (ZOLOFT) 50 MG tablet Take 1.5 tablets (75 mg total) by mouth daily.   SUMAtriptan (IMITREX) 100 MG tablet Take by mouth as needed.   topiramate (TOPAMAX) 25 MG tablet Take 50 mg by mouth 2 (two) times daily.   VENTOLIN HFA 108 (90 Base)  MCG/ACT inhaler INHALE 2 PUFFS BY MOUTH EVERY 4 HOURS AS NEEDED FOR WHEEZE OR FOR SHORTNESS OF BREATH   No facility-administered encounter medications on file as of 06/20/2023.   ALLERGIES: Allergies  Allergen Reactions   Glucosamine Forte [Nutritional Supplements] Anaphylaxis    Due to containing shellfish   Nutritional Supplements Anaphylaxis   Oyster Extract Anaphylaxis   Shellfish-Derived Products Anaphylaxis    Throat swelling   Cats Claw (Uncaria Tomentosa) Nausea And Vomiting   Diamox [Acetazolamide] Other (See Comments)    Numbness- feet, hands, lips   Lactose    Lactose Intolerance (Gi)    Topiramate Other (See Comments)    Numbness to face, hands and feet   Bromfed Dm [Pseudoeph-Bromphen-Dm] Anxiety    CARBOFED DM    VACCINATION STATUS: Immunization History  Administered Date(s) Administered   COVID-19, mRNA, vaccine(Comirnaty)12 years and older 11/17/2022   DTaP 09/24/2003, 12/10/2003, 02/16/2004, 11/01/2004, 07/23/2007, 07/09/2012   Dtap, Unspecified 11/01/2004, 07/23/2007   H1N1 10/07/2008, 11/12/2008   HIB (PRP-OMP) 09/24/2003, 12/10/2003, 02/16/2004, 11/01/2004, 07/23/2017   HIB (PRP-T) 09/24/2003, 12/10/2003, 02/16/2004   HIB, Unspecified 11/01/2004   HPV Quadrivalent 09/04/2013, 11/05/2013, 03/20/2014   Hep A, Unspecified 07/24/2006, 07/23/2007   Hep B, Unspecified 09/03/2003, 12/10/2003, 07/26/2004   IPV 09/24/2003, 12/10/2003, 11/01/2004, 07/23/2007   Influenza Nasal 08/10/2010, 09/03/2012, 09/04/2013, 09/14/2014   Influenza Split 09/05/2011, 09/05/2021   Influenza, Seasonal, Injecte, Preservative Fre 10/12/2005, 10/07/2008, 09/27/2009   Influenza,Quad,Nasal, Live 09/03/2012, 09/04/2013, 09/14/2014   Influenza,inj,Quad PF,6+ Mos 08/30/2015, 09/21/2016, 09/13/2017, 09/06/2018, 08/27/2019, 11/12/2020, 09/05/2021, 10/11/2022   Influenza,inj,quad, With Preservative 10/12/2018   Influenza-Unspecified 09/05/2011, 09/05/2011   MMR 07/26/2004, 07/23/2007    Meningococcal B, OMV  10/21/2019, 05/13/2020   Meningococcal Mcv4o 09/14/2014, 10/21/2019   PFIZER Comirnaty(Gray Top)Covid-19 Tri-Sucrose Vaccine 04/01/2021, 05/06/2021   Pneumococcal Conjugate PCV 7 09/24/2003, 12/10/2003, 02/16/2004, 11/01/2004   Pneumococcal Polysaccharide-23 07/09/2012, 03/06/2022   Pneumococcal-Unspecified 09/24/2003, 02/16/2004, 11/01/2004, 12/09/2006   Polio, Unspecified 11/01/2004, 07/23/2007   Tdap 09/14/2014   Varicella 07/26/2004, 07/23/2007    HPI ARACELIA BRINSON is 22 y.o. female who presents today with a medical history as above. she is being seen in follow-up after she was seen in consultation for hyperhidrosis requested by Sonny Masters, FNP.  She did some of her labs were requested during her last visit.  She has not changed any of her medications since last visit.  She continues to have the sweating problem that triggered this consult. She is accompanied by her mother to clinic.  She denies any prior history of adrenal, thyroid nor hypothalamic dysfunction.  Patient's main concern is hyperhidrosis which is happening on a regular basis, no particular association with seasons, circadian rhythm, no emotional status.  Her urinary 5-HIAA is pending.  She dealt with this complaint for at least 5 years.  She underwent several workup including 24-hour urine metanephrines measurements as well as adrenal CT scan which did not show any particular pathology.  Of note patient is on polypharmacy for multiple medical problems including mood disorders, bipolar disorders, anxiety disorders. Patient has been overweight/obese for most of her life including during her pediatric ages. She currently weighs 229, at a height of 411 given her BMI of 46.33. She has prediabetes, PCOS, hyperlipidemia, hypertension, besides her mental illnesses. She is 22 years old with 30+ medications on her list. Her active medications include Strattera, azelastine-fluticasone, bisoprolol  fumarate, Symbicort, buspirone, cariprazine, Klonopin, Valium, Benadryl, Allegra, Vistaril, Singulair, Zoloft, Imitrex, Topamax.  There is significant family history of obesity, type 2 diabetes, hypertension, hyperlipidemia, asthma, thyroid dysfunction, multiple allergy conditions among others. She gives history of being suicidal in 2019, however the medications she is taking currently helped her regain her insight.  She is not suicidal anymore.  Patient is not following any particular diet or exercise program.  She has not engaged optimally to the lifestyle medicine we discussed last time. 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 She denies any exposure to heavy dose steroids, except in her bronchodilators.   Review of Systems  Constitutional: + Progressive weight gain,+ fatigue, no subjective hyperthermia, no subjective hypothermia Eyes: no blurry vision, no xerophthalmia ENT: no sore throat, no nodules palpated in throat, no dysphagia/odynophagia, no hoarseness Cardiovascular: no Chest Pain, +Shortness of Breath on exertion, no palpitations, no leg swelling Respiratory: no cough, + shortness of breath Gastrointestinal: no Nausea/Vomiting/Diarhhea Musculoskeletal: no muscle/joint aches Skin: no rashes, + nonspecific  hyperhidrosis Neurological: no tremors, no numbness, no tingling, no dizziness Psychiatric: + depression, + anxiety  Objective:       06/20/2023    2:17 PM 05/24/2023   10:50 AM 05/18/2023   11:09 AM  Vitals with BMI  Height 4\' 11"  4\' 11"  4\' 11"   Weight 229 lbs 6 oz 227 lbs 3 oz 228 lbs 6 oz  BMI 46.31 45.86 46.11  Systolic 106 109 409  Diastolic 66 81 74  Pulse 88 98 88    BP 106/66   Pulse 88   Ht 4\' 11"  (1.499 m)   Wt 229 lb 6.4 oz (104.1 kg)   LMP 05/22/2023 (Approximate)   BMI 46.33 kg/m   Wt Readings from Last 3 Encounters:  06/20/23 229 lb 6.4  oz (104.1 kg) (99%, Z= 2.32)*  05/24/23 227 lb 3.2 oz (103.1 kg) (99%, Z= 2.29)*  05/18/23 228  lb 6.4 oz (103.6 kg) (99%, Z= 2.31)*   * Growth percentiles are based on CDC (Girls, 2-20 Years) data.    Physical Exam  Constitutional:  Body mass index is 46.33 kg/m.,  not in acute distress, normal state of mind Eyes: PERRLA, EOMI, no exophthalmos ENT: moist mucous membranes, no gross thyromegaly, no gross cervical lymphadenopathy Cardiovascular: normal precordial activity, Regular Rate and Rhythm, no Murmur/Rubs/Gallops Respiratory:  adequate breathing efforts, no gross chest deformity, Clear to auscultation bilaterally Gastrointestinal: abdomen - + Obese, Soft, Non -tender, No distension, Bowel Sounds present, no gross organomegaly Musculoskeletal: no gross deformities, strength intact in all four extremities, no peripheral edema Skin: moist, warm, no rashes Neurological: no tremor with outstretched hands, Deep tendon reflexes normal in bilateral lower extremities.  CMP ( most recent) CMP     Component Value Date/Time   NA 144 05/18/2023 1208   K 3.7 05/18/2023 1208   CL 105 05/18/2023 1208   CO2 23 05/18/2023 1208   GLUCOSE 99 05/18/2023 1208   GLUCOSE 103 (H) 04/05/2018 1818   BUN 8 05/18/2023 1208   CREATININE 0.78 05/18/2023 1208   CALCIUM 10.0 05/18/2023 1208   PROT 6.6 05/18/2023 1208   ALBUMIN 4.6 05/18/2023 1208   AST 26 05/18/2023 1208   ALT 40 (H) 05/18/2023 1208   ALKPHOS 96 05/18/2023 1208   BILITOT 0.4 05/18/2023 1208   EGFR 112 05/18/2023 1208   GFRNONAA NOT CALCULATED 04/05/2018 1818     Diabetic Labs (most recent): Lab Results  Component Value Date   HGBA1C 5.0 05/18/2023   HGBA1C 5.8 (H) 10/11/2022   HGBA1C 5.2 03/06/2022   MICROALBUR 150 06/20/2023     Lipid Panel ( most recent) Lipid Panel     Component Value Date/Time   CHOL 181 (H) 05/29/2023 1030   TRIG 134 (H) 05/29/2023 1030   HDL 44 05/29/2023 1030   CHOLHDL 4.1 05/29/2023 1030   CHOLHDL 3.3 03/07/2018 0713   VLDL 20 03/07/2018 0713   LDLCALC 113 (H) 05/29/2023 1030   LABVLDL  24 05/29/2023 1030      Lab Results  Component Value Date   TSH 4.790 (H) 05/29/2023   TSH 3.270 05/18/2023   TSH 1.550 03/06/2022   TSH 2.434 03/07/2018   FREET4 1.07 05/29/2023   FREET4 1.10 03/06/2022       Assessment & Plan:   1. Hyperhidrosis 2. Prediabetes 3.  Morbid obesity 4.  Hypertension 5.  Hyperlipidemia 6.  Hypothyroidism I reviewed her new and available records with her. -She would benefit from early initiation of low-dose levothyroxine.  I discussed and initiated levothyroxine 25 mcg p.o. daily before breakfast.   - We discussed about the correct intake of her thyroid hormone, on empty stomach at fasting, with water, separated by at least 30 minutes from breakfast and other medications,  and separated by more than 4 hours from calcium, iron, multivitamins, acid reflux medications (PPIs). -Patient is made aware of the fact that thyroid hormone replacement is needed for life, dose to be adjusted by periodic monitoring of thyroid function tests.  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 -She was previously worked up and ruled out for pheochromocytoma.  Her 5 HIAA is pending.  She will be considered for plasma free metanephrines before her next visit. -Her workup rules out Cushing's syndrome. She is vitamin D replete.  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 She has at least 8 medications with known side effects of hyperhidrosis including Strattera, azelastine-fluticasone, buspirone, cariprazine, Klonopin, Valium, Zoloft, Imitrex. -It seems like she is benefiting from this medications.  Unfortunately patient is on serious polypharmacy with 30+ medications at age 6.  Her medication list deserves to be revised and revisited by respective prescribers.  I advised her to reduce concern during her next visits with her psychiatrist as  well as primary care provider.  In light of her metabolic dysfunction with morbid obesity, PCOS, hyperlipidemia, hypertension, prediabetes, mental illness, this patient remains to be a good candidate for lifestyle medicine.   -She does not have ideal family situation to engage. - she acknowledges that there is a room for improvement in her food and drink choices. - Suggestion is made for her to avoid simple carbohydrates  from her diet including Cakes, Sweet Desserts, Ice Cream, Soda (diet and regular), Sweet Tea, Candies, Chips, Cookies, Store Bought Juices, Alcohol , Artificial Sweeteners,  Coffee Creamer, and "Sugar-free" Products, Lemonade. This will help patient to have more stable blood glucose profile and potentially avoid unintended weight gain.  The following Lifestyle Medicine recommendations according to American College of Lifestyle Medicine  [facility]) were discussed and and offered to patient and she  agrees to start the journey:  A. Whole Foods, Plant-Based Nutrition comprising of fruits and vegetables, plant-based proteins, whole-grain carbohydrates was discussed in detail with the patient.   A list for source of those nutrients were also provided to the patient.  Patient will use only water or unsweetened tea for hydration. B.  The need to stay away from risky substances including alcohol, smoking; obtaining 7 to 9 hours of restorative sleep, at least 150 minutes of moderate intensity exercise weekly, the importance of healthy social connections,  and stress management techniques were discussed. C.  A full color page of  Calorie density of various food groups per pound showing examples of each food groups was provided to the patient.   Even with partial engagement, this patient can drow significant benefit potentially coming off of most of her medications.   If her  labs are suggestive of any endocrine dysfunction, she will be considered for specific treatment accordingly.    - she is  advised to maintain close follow up with Rakes, Doralee Albino, FNP for primary care needs.   I spent  26  minutes in the care of the patient today including review of labs from Thyroid Function, CMP, and other relevant labs ; imaging/biopsy records (current and previous including abstractions from other facilities); face-to-face time discussing  her lab results and symptoms, medications doses, her options of short and long term treatment based on the latest standards of care / guidelines;   and documenting the encounter.  Molly [name]  participated in the discussions, expressed understanding, and voiced agreement with the above plans.  All questions were answered to her satisfaction. she is encouraged to contact clinic should she have any questions or concerns prior to her return visit.  Follow up plan: Return in about 3 months (around 09/20/2023) for A1c -NV, F/U with Pre-visit Labs.   Marquis Lunch, MD [facility] [address] [place], [place] [address] Phone: [phone]  Fax: [phone]     06/20/2023, 7:01 PM  This note was partially dictated with voice recognition software. Similar sounding words  can be transcribed inadequately or may not  be corrected upon review.

## 2023-06-20 NOTE — Patient Instructions (Signed)
                                      Advice for Weight Management  -For most of us the best way to lose weight is by diet management. Generally speaking, diet management means consuming less calories intentionally which over time brings about progressive weight loss.  This can be achieved more effectively by avoiding ultra processed carbohydrates, processed meats, unhealthy fats.    It is critically important to know your numbers: how much calorie you are consuming and how much calorie you need. More importantly, our carbohydrates sources should be unprocessed naturally occurring  complex starch food items.  It is always important to balance nutrition also by  appropriate intake of proteins (mainly plant-based), healthy fats/oils, plenty of fruits and vegetables.   -The American College of Lifestyle Medicine (ACL M) recommends nutrition derived mostly from Whole Food, Plant Predominant Sources example an apple instead of applesauce or apple pie. Eat Plenty of vegetables, Mushrooms, fruits, Legumes, Whole Grains, Nuts, seeds in lieu of processed meats, processed snacks/pastries red meat, poultry, eggs.  Use only water or unsweetened tea for hydration.  The College also recommends the need to stay away from risky substances including alcohol, smoking; obtaining 7-9 hours of restorative sleep, at least 150 minutes of moderate intensity exercise weekly, importance of healthy social connections, and being mindful of stress and seek help when it is overwhelming.    -Sticking to a routine mealtime to eat 3 meals a day and avoiding unnecessary snacks is shown to have a big role in weight control. Under normal circumstances, the only time we burn stored energy is when we are hungry, so allow  some hunger to take place- hunger means no food between appropriate meal times, only water.  It is not advisable to starve.   -It is better to avoid simple carbohydrates including:  Cakes, Sweet Desserts, Ice Cream, Soda (diet and regular), Sweet Tea, Candies, Chips, Cookies, Store Bought Juices, Alcohol in Excess of  1-2 drinks a day, Lemonade,  Artificial Sweeteners, Doughnuts, Coffee Creamers, "Sugar-free" Products, etc, etc.  This is not a complete list.....    -Consulting with certified diabetes educators is proven to provide you with the most accurate and current information on diet.  Also, you may be  interested in discussing diet options/exchanges , we can schedule a visit with Molly [name], RDN, CDE for individualized nutrition education.  -Exercise: If you are able: 30 -60 minutes a day ,4 days a week, or 150 minutes of moderate intensity exercise weekly.    The longer the better if tolerated.  Combine stretch, strength, and aerobic activities.  If you were told in the past that you have high risk for cardiovascular diseases, or if you are currently symptomatic, you may seek evaluation by your heart doctor prior to initiating moderate to intense exercise programs.                                  Additional Care Considerations for Diabetes/Prediabetes   -Diabetes  is a chronic disease.  The most important care consideration is regular follow-up with your diabetes care provider with the goal being avoiding or delaying its complications and to take advantage of advances in medications and technology.  If appropriate actions are taken early enough, type 2 diabetes can even be   reversed.  Seek information from the right source.  - Whole Food, Plant Predominant Nutrition is highly recommended: Eat Plenty of vegetables, Mushrooms, fruits, Legumes, Whole Grains, Nuts, seeds in lieu of processed meats, processed snacks/pastries red meat, poultry, eggs as recommended by American College of  Lifestyle Medicine (ACLM).  -Type 2 diabetes is known to coexist with other important comorbidities such as high blood pressure and high cholesterol.  It is critical to control not only the  diabetes but also the high blood pressure and high cholesterol to minimize and delay the risk of complications including coronary artery disease, stroke, amputations, blindness, etc.  The good news is that this diet recommendation for type 2 diabetes is also very helpful for managing high cholesterol and high blood blood pressure.  - Studies showed that people with diabetes will benefit from a class of medications known as ACE inhibitors and statins.  Unless there are specific reasons not to be on these medications, the standard of care is to consider getting one from these groups of medications at an optimal doses.  These medications are generally considered safe and proven to help protect the heart and the kidneys.    - People with diabetes are encouraged to initiate and maintain regular follow-up with eye doctors, foot doctors, dentists , and if necessary heart and kidney doctors.     - It is highly recommended that people with diabetes quit smoking or stay away from smoking, and get yearly  flu vaccine and pneumonia vaccine at least every 5 years.  See above for additional recommendations on exercise, sleep, stress management , and healthy social connections.      

## 2023-06-28 ENCOUNTER — Ambulatory Visit (HOSPITAL_BASED_OUTPATIENT_CLINIC_OR_DEPARTMENT_OTHER): Payer: Medicaid Other | Admitting: Family

## 2023-06-29 ENCOUNTER — Other Ambulatory Visit (HOSPITAL_COMMUNITY): Payer: Self-pay | Admitting: Psychiatry

## 2023-07-04 ENCOUNTER — Ambulatory Visit (INDEPENDENT_AMBULATORY_CARE_PROVIDER_SITE_OTHER): Payer: MEDICAID | Admitting: Adult Health

## 2023-07-04 ENCOUNTER — Encounter: Payer: Self-pay | Admitting: Adult Health

## 2023-07-04 ENCOUNTER — Ambulatory Visit (INDEPENDENT_AMBULATORY_CARE_PROVIDER_SITE_OTHER): Payer: MEDICAID

## 2023-07-04 VITALS — BP 132/90 | HR 86 | Ht 59.0 in | Wt 228.5 lb

## 2023-07-04 DIAGNOSIS — R1032 Left lower quadrant pain: Secondary | ICD-10-CM

## 2023-07-04 DIAGNOSIS — Z7689 Persons encountering health services in other specified circumstances: Secondary | ICD-10-CM

## 2023-07-04 DIAGNOSIS — E282 Polycystic ovarian syndrome: Secondary | ICD-10-CM

## 2023-07-04 DIAGNOSIS — J4550 Severe persistent asthma, uncomplicated: Secondary | ICD-10-CM

## 2023-07-04 NOTE — Progress Notes (Signed)
PELVIC US TA/TV: homogeneous anteverted uterus,WNL,EEC 5.8 mm,normal ovaries with multiple peripheral follicles,no free fluid,no pain during ultrasound

## 2023-07-04 NOTE — Progress Notes (Signed)
  Subjective:     Patient ID: Molly [name], female   DOB: 04-15-2001, 22 y.o.   MRN: [id number]  HPI Molly [name] is a 22 year old white female,single, G0P0, in for follow up on [place], for PCO. She is having period on Micronor and some BTB.  She has seen Dr Fransico Him and is on 25 mcg synthroid now.   PCP is Gilford Silvius FNP  Review of Systems Had period on Micronor and some BTB, for like half a day Reviewed past medical,surgical, social and family history. Reviewed medications and allergies.     Objective:   Physical Exam BP (!) 132/90 (BP Location: Right Arm, Patient Position: Sitting, Cuff Size: Normal)   Pulse 86   Ht 4\' 11"  (1.499 m)   Wt 228 lb 8 oz (103.6 kg)   LMP 06/28/2023   BMI 46.15 kg/m     Skin warm and dry. Lungs: clear to ausculation bilaterally. Cardiovascular: regular rate and rhythm.  Discussed [place] with pt and mom: US showed normal uterus, EEC 5.8 mm, both ovaries has multiple peripheral follicles.   Fall risk is low  Upstream - 07/04/23 1410       Pregnancy Intention Screening   Does the patient want to become pregnant in the next year? No    Does the patient's partner want to become pregnant in the next year? No    Would the patient like to discuss contraceptive options today? No      Contraception Wrap Up   Current Method Abstinence;Oral Contraceptive    End Method Abstinence;Oral Contraceptive    Contraception Counseling Provided Yes             Assessment:     1. Encounter for menstrual regulation Had period with micronor Has had some BTB Continue micronor, has refills   2. PCO (polycystic ovaries) Review handout on PCO   3. Morbid obesity (HCC) Continue weight loss efforts    Plan:     Follow up with me in 3 months for ROS

## 2023-07-05 ENCOUNTER — Ambulatory Visit (HOSPITAL_BASED_OUTPATIENT_CLINIC_OR_DEPARTMENT_OTHER): Payer: MEDICAID | Admitting: Family

## 2023-07-05 ENCOUNTER — Encounter (HOSPITAL_BASED_OUTPATIENT_CLINIC_OR_DEPARTMENT_OTHER): Payer: Self-pay | Admitting: Family

## 2023-07-05 VITALS — BP 115/81 | HR 89 | Ht 59.0 in | Wt 226.0 lb

## 2023-07-05 DIAGNOSIS — Z68.41 Body mass index (BMI) pediatric, greater than or equal to 95th percentile for age: Secondary | ICD-10-CM | POA: Diagnosis not present

## 2023-07-05 DIAGNOSIS — R002 Palpitations: Secondary | ICD-10-CM | POA: Diagnosis not present

## 2023-07-05 DIAGNOSIS — I10 Essential (primary) hypertension: Secondary | ICD-10-CM | POA: Diagnosis not present

## 2023-07-05 DIAGNOSIS — Q211 Atrial septal defect, unspecified: Secondary | ICD-10-CM | POA: Diagnosis not present

## 2023-07-05 DIAGNOSIS — Z6841 Body Mass Index (BMI) 40.0 and over, adult: Secondary | ICD-10-CM

## 2023-07-05 NOTE — Patient Instructions (Addendum)
Medication Instructions:  Your physician recommends that you continue on your current medications as directed. Please refer to the Current Medication list given to you today.  Labwork: NONE  Testing/Procedures: NONE  Follow-Up: 11/07/2023 AT 2:30 PM WITH DR [facility]   Any Other Special Instructions Will Be Listed Below (If Applicable).  YOU NEED TO DRINK AT LEAST 64 OUNCES OF FLUID A DAY   If you need a refill on your cardiac medications before your next appointment, please call your pharmacy.

## 2023-07-05 NOTE — Progress Notes (Signed)
Advanced Hypertension Clinic Assessment:    Date:  07/08/2023   ID:  GABRELLE ROCA, DOB [birth year], MRN [id number]  PCP:  Sonny Masters, FNP  Cardiologist:  None  Nephrologist:  Referring MD: Sonny Masters, FNP   CC: Hypertension  History of Present Illness:    JARED CAHN is a 22 y.o. female with a hx of ASD s/p repair 2012, POTS, HTN, HLD, asthma, migraines, gastritis, hiatal hernia, scoliosis, obesity, hyperhidrosis, ADHD, PTSD, anxiety, depression, suicidal ideation here to follow up in the Advanced Hypertension Clinic.   Previously has followed with Dr. Graciela Husbands for palpitations and intermittent attacks of diaphoresis with shaking and cold hands with sensation of being flushed.  Thyroid function normal.  Monitor November 2023 average heart rate of 109 bpm with rare PAC and PVC.  Prior C[place]ings workup 2017-2018 with pediatric endocrinology at [facility]. Her brother has low cortisol and her mother has hypothyroidism.   Established with Dr. Duke Salvia and hypertension clinic 11/22/2022.  Her blood pressure was uncontrolled and bisoprolol was increased to 10 mg. 12/01/22 cortisol 0.4.Marland Kitchen 24h urine normetanephrines 451 but otherwise unremarkable. CT abdomen 12/20/22 normal adrenal glands. Scattered mesenteric and right lower quadrant lymph nodes not pathologically enlarged, likely reactive. She was referred to endocrine.   Has been followed for hypertension at early age following ASD repair in 2012. She has had intermittent "attacks" worse since 2021 associated with diaphoresis, elevated heart rates, palpitations, dizziness, imbalance. Was diagnosed with POTS by her PCP at 22 years old. Echo 12/2022 LVEF 55-60%, mild LVH, no residual shunting across ASD repair.   Last seen by [facility]pertension Clinic 03/29/23 with episodes less severe. Had established with new gynecologist and birth control was tapered down improving diaphoresis. Orthostatic symptoms persisted. Her BP  was controlled and bisoprolol continued. When she saw Dr. Graciela Husbands 05/18/23 her Bisoprolol was reduced.   Presents today for follow up with her mother. Recently graduated with a Teacher, English as a foreign language. Notes more recent episodes. Reports recurrent of passing out feeling. Again concerns about facial flushing.Her heart rate has been elevating but not as fast as prior. Feeling shaky, dizzy, tunnel vision. Mom notes that with bare minimum activity she is feeling poorly. For example had to lay down after vacuuming recently. Episodes stopped and started again recently. She is drinking hardly anything. Eating 1-2 meals per day. BP at home overall at goal.  Previous antihypertensives: Amlodipine Lisinopril  Past Medical History:  Diagnosis Date   Acid reflux 2010   Acne vulgaris 2017   ADHD (attention deficit hyperactivity disorder) 2011   Adverse food reaction 07/11/2016   Shellfish allergy   Amenorrhea 2019   Anaphylactic shock due to adverse food reaction 01/15/2018   Anxiety 2017   ASD (atrial septal defect) 11/22/2022   Chronic constipation 09/05/2011   Chronic tension headaches 2011   originally followed by [facility] K.Griffin. Then Dr Alexis Goodell [facility] 03/2020 (see above)    Coalition, calcaneus navicular    Dysmenorrhea    Eczema    Eustachian tube dysfunction, left 10/23/2019   Frequent headaches    Galactorrhea    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 04/01/2019   Hiatal hernia    History of atrial septal defect repair 12/04/2011   Hyperlipidemia 2013   Hypertension 2011   Hypertension 09/28/2020   hypertension, cellulitus    Idiopathic urticaria 09/05/2011   Insomnia    Keratosis pilaris 01/15/2018   Mass of left thigh 11/22/2017   Overview:  Added automatically from  request for surgery [id number]   MDD (major depressive disorder), severe (HCC) 03/06/2018   Migraine 2011   re-diagnosed as chronic migraines 03/2020 [facility] - starting on preventative    Obesity without serious  comorbidity with body mass index (BMI) in 95th to 98th percentile for age in pediatric patient 09/04/2018   Piriformis syndrome    POTS (postural orthostatic tachycardia syndrome)    PTSD (post-traumatic stress disorder)    Scoliosis 2019   Seasonal and perennial allergic rhinitis 2010   Severe persistent asthma without complication 2010   Sleep paralysis    Suicidal ideation 03/07/2018   Thyroid disease    underactive thyroid   Tic disorder 2019    Past Surgical History:  Procedure Laterality Date   ASD REPAIR  2012   with Helix, via Cardiac Catheterization   atrial septal defecgt     CARDIAC SURGERY     DENTAL SURGERY  2020   lipoma removal  2019   LUMBAR PUNCTURE  05/20/2021   TONSILLECTOMY AND ADENOIDECTOMY  2010    Current Medications: Current Meds  Medication Sig   albuterol (PROVENTIL) (2.5 MG/3ML) 0.083% nebulizer solution Take 3 mLs (2.5 mg total) by nebulization every 6 (six) hours as needed for wheezing or shortness of breath.   ascorbic acid (VITAMIN C) 500 MG tablet Take 500 mg by mouth daily.   atomoxetine (STRATTERA) 60 MG capsule Take 1 capsule (60 mg total) by mouth daily.   Azelastine-Fluticasone 137-50 MCG/ACT SUSP Place 1 spray into both nostrils 2 (two) times daily.   bisoprolol (ZEBETA) 5 MG tablet Take 1.5 tablets (7.5 mg total) by mouth daily.   budesonide-formoterol (SYMBICORT) 160-4.5 MCG/ACT inhaler Inhale 2 puffs into the lungs in the morning and at bedtime.   busPIRone (BUSPAR) 10 MG tablet Take 1 tablet (10 mg total) by mouth 2 (two) times daily.   calcium carbonate (OS-CAL) 1250 (500 Ca) MG chewable tablet Chew by mouth.   cariprazine (VRAYLAR) 1.5 MG capsule Take 1 capsule (1.5 mg total) by mouth daily.   Cholecalciferol 25 MCG (1000 UT) tablet Take 1 Units by mouth daily at 2 PM.   clobetasol (TEMOVATE) 0.05 % external solution APPLY TO AFFECTED AREA TWICE A DAY   clonazePAM (KLONOPIN) 0.5 MG tablet Take 1 tablet (0.5 mg total) by mouth 2 (two)  times daily as needed for anxiety.   dicyclomine (BENTYL) 10 MG capsule Take 10 mg by mouth as needed for spasms.   EPIPEN 2-PAK 0.3 MG/0.3ML SOAJ injection INJECT 0.3 MG INTO THE MUSCLE AS NEEDED FOR ANAPHYLAXIS.   fexofenadine (ALLEGRA) 180 MG tablet Take 1 tablet (180 mg total) by mouth daily.   Galcanezumab-gnlm 120 MG/ML SOAJ Inject into the skin. Emgality   hydrOXYzine (VISTARIL) 25 MG capsule Take 1-2 capsules (25-50 mg total) by mouth daily as needed.   levothyroxine (SYNTHROID) 25 MCG tablet Take 1 tablet (25 mcg total) by mouth daily.   LINZESS 72 MCG capsule Take 72 mcg by mouth daily.   Loratadine (CLARITIN PO) Take by mouth.   metoprolol tartrate (LOPRESSOR) 25 MG tablet Take 1 tablet (25 mg total) by mouth as needed.   montelukast (SINGULAIR) 10 MG tablet Take 1 tablet (10 mg total) by mouth daily.   Multiple Vitamin (MULTIVITAMIN) capsule Take by mouth.   norethindrone (MICRONOR) 0.35 MG tablet Take 1 tablet (0.35 mg total) by mouth daily.   nystatin-triamcinolone ointment (MYCOLOG) Apply 1 Application topically 2 (two) times daily.   Olopatadine HCl 0.2 % SOLN Apply  1 drop to eye every morning.   ondansetron (ZOFRAN-ODT) 4 MG disintegrating tablet DISSOLVE ON TONGUE AT ONSET OF NAUSEA- 30 DAY SUPPLY   pantoprazole (PROTONIX) 40 MG tablet Take 1 tablet by mouth in the morning and at bedtime.   RESTASIS 0.05 % ophthalmic emulsion 1 drop 2 (two) times daily.   sertraline (ZOLOFT) 50 MG tablet TAKE 1 AND 1/2 TABLETS BY MOUTH DAILY   SUMAtriptan (IMITREX) 100 MG tablet Take by mouth as needed.   topiramate (TOPAMAX) 25 MG tablet Take 50 mg by mouth 2 (two) times daily.   VENTOLIN HFA 108 (90 Base) MCG/ACT inhaler INHALE 2 PUFFS BY MOUTH EVERY 4 HOURS AS NEEDED FOR WHEEZE OR FOR SHORTNESS OF BREATH     Allergies:   Glucosamine forte [nutritional supplements], Nutritional supplements, Oyster extract, Shellfish-derived products, Cats claw (uncaria tomentosa), Diamox [acetazolamide],  Lactose, Lactose intolerance (gi), Topiramate, and Bromfed dm [pseudoeph-bromphen-dm]   Social History   Socioeconomic History   Marital status: Single    Spouse name: Not on file   Number of children: Not on file   Years of education: Not on file   Highest education level: Not on file  Occupational History   Not on file  Tobacco Use   Smoking status: Never   Smokeless tobacco: Never  Vaping Use   Vaping status: Never Used  Substance and Sexual Activity   Alcohol use: No    Alcohol/week: 0.0 standard drinks of alcohol   Drug use: No   Sexual activity: Never    Birth control/protection: Pill  Other Topics Concern   Not on file  Social History Narrative   Not on file   Social Determinants of Health   Financial Resource Strain: Low Risk  (03/06/2023)   Overall Financial Resource Strain (CARDIA)    Difficulty of Paying Living Expenses: Not hard at all  Food Insecurity: No Food Insecurity (03/06/2023)   Hunger Vital Sign    Worried About Running Out of Food in the Last Year: Never true    Ran Out of Food in the Last Year: Never true  Transportation Needs: No Transportation Needs (03/06/2023)   PRAPARE - Administrator, Civil Service (Medical): No    Lack of Transportation (Non-Medical): No  Physical Activity: Inactive (03/06/2023)   Exercise Vital Sign    Days of Exercise per Week: 0 days    Minutes of Exercise per Session: 0 min  Stress: No Stress Concern Present (03/06/2023)   [employer] of Occupational Health - Occupational Stress Questionnaire    Feeling of Stress : Only a little  Social Connections: Socially Isolated (03/06/2023)   Social Connection and Isolation Panel [NHANES]    Frequency of Communication with Friends and Family: Three times a week    Frequency of Social Gatherings with Friends and Family: Once a week    Attends Religious Services: Never    Database administrator or Organizations: No    Attends Engineer, structural: Never     Marital Status: Never married     Family History: The patient's family history includes ADD / ADHD in her brother and father; Alcohol abuse in her father; Allergic rhinitis in her brother and mother; Anxiety disorder in her mother; Arthritis in her mother; Asthma in her brother and mother; Bipolar disorder in her father, maternal grandfather, and mother; Depression in her mother; Diabetes in her mother; Drug abuse in her father; Heart attack in her maternal grandfather; Heart disease in her maternal  grandmother; Hyperlipidemia in her maternal grandmother and mother; Hypertension in her maternal grandmother and mother; Learning disabilities in her brother; Mental illness in her father and mother; Miscarriages / [place] in her mother; Thyroid disease in her mother. There is no history of Angioedema, Eczema, Immunodeficiency, or Urticaria.  ROS:   Please see the history of present illness.     All other systems reviewed and are negative.  EKGs/Labs/Other Studies Reviewed:    EKG:  EKG is not ordered today.    Recent Labs: 05/18/2023: ALT 40; BUN 8; Creatinine, Ser 0.78; Hemoglobin 13.5; Platelets 255; Potassium 3.7; Sodium 144 05/29/2023: TSH 4.790   Recent Lipid Panel    Component Value Date/Time   CHOL 181 (H) 05/29/2023 1030   TRIG 134 (H) 05/29/2023 1030   HDL 44 05/29/2023 1030   CHOLHDL 4.1 05/29/2023 1030   CHOLHDL 3.3 03/07/2018 0713   VLDL 20 03/07/2018 0713   LDLCALC 113 (H) 05/29/2023 1030   Physical Exam:   VS:  BP 115/81   Pulse 89   Ht 4\' 11"  (1.499 m)   Wt 226 lb (102.5 kg)   LMP 06/28/2023   BMI 45.65 kg/m  , BMI Body mass index is 45.65 kg/m. GENERAL:  Well appearing, overweight HEENT: Pupils equal round and reactive, fundi not visualized, oral mucosa unremarkable NECK:  No jugular venous distention, waveform within normal limits, carotid upstroke brisk and symmetric, no bruits, no thyromegaly LYMPHATICS:  No cervical adenopathy LUNGS:  Clear to auscultation  bilaterally HEART:  RRR.  PMI not displaced or sustained,S1 and S2 within normal limits, no S3, no S4, no clicks, no rubs, no murmurs ABD:  Flat, positive bowel sounds normal in frequency in pitch, no bruits, no rebound, no guarding, no midline pulsatile mass, no hepatomegaly, no splenomegaly EXT:  2 plus pulses throughout, no edema, no cyanosis no clubbing SKIN:  No rashes no nodules NEURO:  Cranial nerves II through XII grossly intact, motor grossly intact throughout PSYCH:  Cognitively intact, oriented to person place and time   ASSESSMENT/PLAN:    HTN - BP at goal in clinic. Continue Bisoprolol 7.5mg  daily. Hesitant to further decrease due to ongoing palpitations.   Spells with flushing episodes / Palpitations / Orthostatic intolerance / Tachycardia - Echo 12/2022 normal LVEF, ASD closure functioning appropriately. Likely element of autonomic dysfunction. She has been drinking "hardly anything" per family member report. Reiterated need to increase hydration, make position changes slowly. Recommend recumbent exercise as likely more tolerable than exercising outside in the heat. Already established with endocrinology.   ?OSA - Has established with Dr. Vassie Loll and was recommended for home sleep study.   Obesity - Weight loss via diet and exercise encouraged. Discussed the impact being overweight would have on cardiovascular risk. Congratulated on weight loss.   Screening for Secondary Hypertension:     11/22/2022    3:55 PM 12/31/2022    8:11 PM  Causes  Drugs/Herbals Screened      - Comments No caffeine or energy drinks.   Sleep Apnea  Screened     - Comments  pending workup with pulmonology  Thyroid Disease  Screened     - Comments  normal TSH 10/2022  Pheochromocytoma  Screened     - Comments  12/2022 CT no adrenal nodules  Cushing's Syndrome  Screened     - Comments  11/2022 normal cortisol  Compliance  Screened    Relevant Labs/Studies:    Latest Ref Rng & Units 05/18/2023  12:08 PM 10/11/2022    3:56 PM 03/06/2022   11:02 AM  Basic Labs  Sodium 134 - 144 mmol/L 144  142  143   Potassium 3.5 - 5.2 mmol/L 3.7  4.0  4.5   Creatinine 0.57 - 1.00 mg/dL 7.42  5.95  6.38        Latest Ref Rng & Units 05/29/2023   10:30 AM 05/18/2023   12:08 PM  Thyroid   TSH 0.450 - 4.500 uIU/mL 4.790  3.270           Latest Ref Rng & Units 05/29/2023   10:30 AM  Metanephrines/Catecholamines   Metanephrines 0.0 - 88.0 pg/mL <25.0   Normetanephrines  0.0 - 150.8 pg/mL 58.4            Disposition:    FU with MD/PharmD in 4 months    Medication Adjustments/Labs and Tests Ordered: Current medicines are reviewed at length with the patient today.  Concerns regarding medicines are outlined above.  No orders of the defined types were placed in this encounter.  No orders of the defined types were placed in this encounter.    Signed, Alver Sorrow, NP  07/08/2023 8:59 PM    [facility]

## 2023-07-08 ENCOUNTER — Encounter (HOSPITAL_BASED_OUTPATIENT_CLINIC_OR_DEPARTMENT_OTHER): Payer: Self-pay | Admitting: Family

## 2023-07-24 ENCOUNTER — Telehealth (HOSPITAL_COMMUNITY): Payer: Self-pay | Admitting: *Deleted

## 2023-07-24 NOTE — Telephone Encounter (Signed)
Spoke with patient to inform her that we received ROI from patient for office to communicate with mother. Staff spoke with patient and informed her that the ROI is the incorrect form and a DPR will be sent via mail. Patient agreed and verbalized understanding to send form back to office as soon as possible after completion.

## 2023-08-01 ENCOUNTER — Ambulatory Visit (INDEPENDENT_AMBULATORY_CARE_PROVIDER_SITE_OTHER): Payer: MEDICAID | Admitting: Family Medicine

## 2023-08-01 ENCOUNTER — Encounter: Payer: Self-pay | Admitting: Family Medicine

## 2023-08-01 VITALS — BP 106/73 | HR 95 | Temp 98.4°F | Ht 59.0 in | Wt 227.0 lb

## 2023-08-01 DIAGNOSIS — J069 Acute upper respiratory infection, unspecified: Secondary | ICD-10-CM | POA: Diagnosis not present

## 2023-08-01 NOTE — Progress Notes (Signed)
Acute Office Visit  Subjective:     Patient ID: Molly [name], female    DOB: 2001/04/05, 22 y.o.   MRN: [id number]  Chief Complaint  Patient presents with   Sinusitis    Sinusitis This is a new problem. Episode onset: 5 days. The problem is unchanged. There has been no fever. Associated symptoms include congestion, coughing, ear pain, headaches, sneezing and a sore throat. Pertinent negatives include no chills, hoarse voice, neck pain, shortness of breath, sinus pressure or swollen glands. Treatments tried: singular, claritin, nasal spray. The treatment provided no relief.   She would like to be tested for Covid and flu.   Review of Systems  Constitutional:  Negative for chills.  HENT:  Positive for congestion, ear pain, sneezing and sore throat. Negative for hoarse voice and sinus pressure.   Respiratory:  Positive for cough. Negative for shortness of breath.   Musculoskeletal:  Negative for neck pain.  Neurological:  Positive for headaches.        Objective:    BP 106/73   Pulse 95   Temp 98.4 F (36.9 C) (Temporal)   Ht 4\' 11"  (1.499 m)   Wt 227 lb (103 kg)   LMP 06/28/2023   SpO2 96%   BMI 45.85 kg/m    Physical Exam Vitals and nursing note reviewed.  Constitutional:      General: She is not in acute distress.    Appearance: She is obese. She is not ill-appearing, toxic-appearing or diaphoretic.  HENT:     Head: Normocephalic and atraumatic.     Right Ear: Ear canal and external ear normal. A middle ear effusion is present. Tympanic membrane is not erythematous, retracted or bulging.     Left Ear: Ear canal and external ear normal. A middle ear effusion is present. Tympanic membrane is not erythematous, retracted or bulging.     Nose: Congestion present.     Mouth/Throat:     Mouth: Mucous membranes are moist.     Pharynx: Oropharynx is clear.     Tonsils: No tonsillar exudate or tonsillar abscesses. 1+ on the right. 1+ on the left.  Eyes:      General:        Right eye: No discharge.        Left eye: No discharge.  Cardiovascular:     Rate and Rhythm: Normal rate and regular rhythm.     Heart sounds: Normal heart sounds. No murmur heard. Pulmonary:     Effort: Pulmonary effort is normal. No respiratory distress.     Breath sounds: Normal breath sounds. No wheezing.  Musculoskeletal:     Cervical back: Neck supple. No rigidity.     Right lower leg: No edema.     Left lower leg: No edema.  Lymphadenopathy:     Cervical: No cervical adenopathy.  Skin:    General: Skin is warm and dry.  Neurological:     Mental Status: She is alert.     No results found for any visits on 08/01/23.      Assessment & Plan:   Molly "[name]" was seen today for sinusitis.  Diagnoses and all orders for this visit:  Acute URI Discussed viral etiology. Discussed that she is out of the window for antiviral treatment for Covid if positive. Testing pending as below. Discussed symptomatic care, quarantine, and return precautions.  -     COVID-19, Flu A+B and RSV   The patient indicates understanding of these issues and  agrees with the plan.  Gabriel Earing, FNP

## 2023-08-03 LAB — COVID-19, FLU A+B AND RSV
Influenza A, NAA: NOT DETECTED
Influenza B, NAA: NOT DETECTED
RSV, NAA: NOT DETECTED
SARS-CoV-2, NAA: NOT DETECTED

## 2023-08-04 ENCOUNTER — Other Ambulatory Visit (HOSPITAL_COMMUNITY): Payer: Self-pay | Admitting: Psychiatry

## 2023-08-04 ENCOUNTER — Other Ambulatory Visit: Payer: Self-pay | Admitting: Family Medicine

## 2023-08-04 DIAGNOSIS — J301 Allergic rhinitis due to pollen: Secondary | ICD-10-CM

## 2023-08-04 DIAGNOSIS — J4550 Severe persistent asthma, uncomplicated: Secondary | ICD-10-CM | POA: Diagnosis not present

## 2023-08-22 ENCOUNTER — Telehealth (HOSPITAL_COMMUNITY): Payer: MEDICAID | Admitting: Psychiatry

## 2023-08-22 ENCOUNTER — Encounter (HOSPITAL_COMMUNITY): Payer: Self-pay | Admitting: Psychiatry

## 2023-08-22 DIAGNOSIS — F333 Major depressive disorder, recurrent, severe with psychotic symptoms: Secondary | ICD-10-CM | POA: Diagnosis not present

## 2023-08-22 DIAGNOSIS — F902 Attention-deficit hyperactivity disorder, combined type: Secondary | ICD-10-CM | POA: Diagnosis not present

## 2023-08-22 MED ORDER — BUSPIRONE HCL 10 MG PO TABS: 10.0000 mg | ORAL_TABLET | Freq: Two times a day (BID) | ORAL | 2 refills | Status: DC

## 2023-08-22 MED ORDER — CLONAZEPAM 0.5 MG PO TABS: 0.5000 mg | ORAL_TABLET | Freq: Two times a day (BID) | ORAL | 2 refills | Status: AC | PRN

## 2023-08-22 MED ORDER — HYDROXYZINE PAMOATE 25 MG PO CAPS: 25.0000 mg | ORAL_CAPSULE | Freq: Every day | ORAL | 2 refills | Status: DC | PRN

## 2023-08-22 MED ORDER — ATOMOXETINE HCL 60 MG PO CAPS: 60.0000 mg | ORAL_CAPSULE | Freq: Every day | ORAL | 1 refills | Status: DC

## 2023-08-22 MED ORDER — SERTRALINE HCL 50 MG PO TABS: 75.0000 mg | ORAL_TABLET | Freq: Every day | ORAL | 1 refills | Status: DC

## 2023-08-22 MED ORDER — CARIPRAZINE HCL 1.5 MG PO CAPS: 1.5000 mg | ORAL_CAPSULE | Freq: Every day | ORAL | 2 refills | Status: DC

## 2023-08-22 NOTE — Progress Notes (Signed)
Virtual Visit via Video Note  I connected with Molly [name] on 08/22/23 at  3:40 PM EDT by a video enabled telemedicine application and verified that I am speaking with the correct person using two identifiers.  Location: Patient: home  Provider: office   I discussed the limitations of evaluation and management by telemedicine and the availability of in person appointments. The patient expressed understanding and agreed to proceed.    I discussed the assessment and treatment plan with the patient. The patient was provided an opportunity to ask questions and all were answered. The patient agreed with the plan and demonstrated an understanding of the instructions.   The patient was advised to call back or seek an in-person evaluation if the symptoms worsen or if the condition fails to improve as anticipated.  I provided 20 minutes of non-face-to-face time during this encounter.   [name], MD  [facility] MD/PA/NP OP Progress Note  08/22/2023 4:06 PM Molly [name]  MRN:  [id number]  Chief Complaint:  Chief Complaint  Patient presents with   Anxiety   Depression   Follow-up   HPI: This patient is a 22 year old female who lives with her mother, mother's fianc and a younger brother in [place].  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  The patient returns for follow-up after 3 months.  For the most part she is doing well.  She is still sad about losing her great grandfather in May.  Overall however her mood has been stable.  At times she has trouble getting to sleep and I suggested using either the hydroxyzine or clonazepam.  She denies significant depression or thoughts of self-harm.  Her anxiety seems to be well-controlled.  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  She is very excited about this.  She continues to lose weight and is feeling better overall.  Her headaches are improving with the Topamax Visit  Diagnosis:    ICD-10-CM   1. Severe episode of recurrent major depressive disorder, with psychotic features (HCC)  F33.3     2. Attention deficit hyperactivity disorder (ADHD), combined type  F90.2       Past Psychiatric History: Past psychiatric admissions at a younger age, 4 years  [facility]rapy [facility]  Past Medical History:  Past Medical History:  Diagnosis Date   Acid reflux 2010   Acne vulgaris 2017   ADHD (attention deficit hyperactivity disorder) 2011   Adverse food reaction 07/11/2016   Shellfish allergy   Amenorrhea 2019   Anaphylactic shock due to adverse food reaction 01/15/2018   Anxiety 2017   ASD (atrial septal defect) 11/22/2022   Chronic constipation 09/05/2011   Chronic tension headaches 2011   originally followed by [facility] K.Griffin. Then Dr Alexis Goodell [facility] 03/2020 (see above)    Coalition, calcaneus navicular    Dysmenorrhea    Eczema    Eustachian tube dysfunction, left 10/23/2019   Frequent headaches    Galactorrhea    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 04/01/2019   Hiatal hernia    History of atrial septal defect repair 12/04/2011   Hyperlipidemia 2013   Hypertension 2011   Hypertension 09/28/2020   hypertension, cellulitus    Idiopathic urticaria 09/05/2011   Insomnia    Keratosis pilaris 01/15/2018   Mass of left thigh 11/22/2017   Overview:  Added automatically from request for surgery [id number]   MDD (major depressive disorder),  severe (HCC) 03/06/2018   Migraine 2011   re-diagnosed as chronic migraines 03/2020 [facility] - starting on preventative    Obesity without serious comorbidity with body mass index (BMI) in 95th to 98th percentile for age in pediatric patient 09/04/2018   Piriformis syndrome    POTS (postural orthostatic tachycardia syndrome)    PTSD (post-traumatic stress disorder)    Scoliosis 2019   Seasonal and perennial allergic rhinitis 2010   Severe persistent asthma without complication 2010   Sleep paralysis    Suicidal  ideation 03/07/2018   Thyroid disease    underactive thyroid   Tic disorder 2019    Past Surgical History:  Procedure Laterality Date   ASD REPAIR  2012   with Helix, via Cardiac Catheterization   atrial septal defecgt     CARDIAC SURGERY     DENTAL SURGERY  2020   lipoma removal  2019   LUMBAR PUNCTURE  05/20/2021   TONSILLECTOMY AND ADENOIDECTOMY  2010    Family Psychiatric History: See below  Family History:  Family History  Problem Relation Age of Onset   Hypertension Mother    Hyperlipidemia Mother    Diabetes Mother    Depression Mother    Arthritis Mother    Allergic rhinitis Mother    Asthma Mother    Bipolar disorder Mother    Anxiety disorder Mother    Mental illness Mother    Miscarriages / [place] Mother    Thyroid disease Mother    Bipolar disorder Father    Drug abuse Father    Alcohol abuse Father    ADD / ADHD Father    Mental illness Father    Allergic rhinitis Brother    Asthma Brother    ADD / ADHD Brother    Learning disabilities Brother    Hypertension Maternal Grandmother    Hyperlipidemia Maternal Grandmother    Heart disease Maternal Grandmother    Heart attack Maternal Grandfather    Bipolar disorder Maternal Grandfather    Angioedema Neg Hx    Eczema Neg Hx    Immunodeficiency Neg Hx    Urticaria Neg Hx     Social History:  Social History   Socioeconomic History   Marital status: Single    Spouse name: Not on file   Number of children: Not on file   Years of education: Not on file   Highest education level: Not on file  Occupational History   Not on file  Tobacco Use   Smoking status: Never   Smokeless tobacco: Never  Vaping Use   Vaping status: Never Used  Substance and Sexual Activity   Alcohol use: No    Alcohol/week: 0.0 standard drinks of alcohol   Drug use: No   Sexual activity: Never    Birth control/protection: Pill  Other Topics Concern   Not on file  Social History Narrative   Not on file   Social  Determinants of Health   Financial Resource Strain: Low Risk  (03/06/2023)   Overall Financial Resource Strain (CARDIA)    Difficulty of Paying Living Expenses: Not hard at all  Food Insecurity: No Food Insecurity (03/06/2023)   Hunger Vital Sign    Worried About Running Out of Food in the Last Year: Never true    Ran Out of Food in the Last Year: Never true  Transportation Needs: No Transportation Needs (03/06/2023)   PRAPARE - Transportation    Lack of Transportation (Medical): No    Lack of  Transportation (Non-Medical): No  Physical Activity: Inactive (03/06/2023)   Exercise Vital Sign    Days of Exercise per Week: 0 days    Minutes of Exercise per Session: 0 min  Stress: No Stress Concern Present (03/06/2023)   [employer] of Occupational Health - Occupational Stress Questionnaire    Feeling of Stress : Only a little  Social Connections: Socially Isolated (03/06/2023)   Social Connection and Isolation Panel [NHANES]    Frequency of Communication with Friends and Family: Three times a week    Frequency of Social Gatherings with Friends and Family: Once a week    Attends Religious Services: Never    Database administrator or Organizations: No    Attends Banker Meetings: Never    Marital Status: Never married    Allergies:  Allergies  Allergen Reactions   Glucosamine Forte [Nutritional Supplements] Anaphylaxis    Due to containing shellfish   Nutritional Supplements Anaphylaxis   Oyster Extract Anaphylaxis   Shellfish-Derived Products Anaphylaxis    Throat swelling   Cats Claw (Uncaria Tomentosa) Nausea And Vomiting   Diamox [Acetazolamide] Other (See Comments)    Numbness- feet, hands, lips   Lactose    Lactose Intolerance (Gi)    Topiramate Other (See Comments)    Numbness to face, hands and feet   Bromfed Dm [Pseudoeph-Bromphen-Dm] Anxiety    CARBOFED DM    Metabolic Disorder Labs: Lab Results  Component Value Date   HGBA1C 5.0 05/18/2023   MPG 105.41  03/07/2018   Lab Results  Component Value Date   PROLACTIN 49.7 (H) 03/07/2018   Lab Results  Component Value Date   CHOL 181 (H) 05/29/2023   TRIG 134 (H) 05/29/2023   HDL 44 05/29/2023   CHOLHDL 4.1 05/29/2023   VLDL 20 03/07/2018   LDLCALC 113 (H) 05/29/2023   LDLCALC 108 05/18/2023   Lab Results  Component Value Date   TSH 4.790 (H) 05/29/2023   TSH 3.270 05/18/2023    Therapeutic Level Labs: No results found for: "LITHIUM" No results found for: "VALPROATE" No results found for: "CBMZ"  Current Medications: Current Outpatient Medications  Medication Sig Dispense Refill   albuterol (PROVENTIL) (2.5 MG/3ML) 0.083% nebulizer solution Take 3 mLs (2.5 mg total) by nebulization every 6 (six) hours as needed for wheezing or shortness of breath. 75 mL 1   ascorbic acid (VITAMIN C) 500 MG tablet Take 500 mg by mouth daily.     atomoxetine (STRATTERA) 60 MG capsule Take 1 capsule (60 mg total) by mouth daily. 90 capsule 1   Azelastine-Fluticasone 137-50 MCG/ACT SUSP Place 1 spray into both nostrils 2 (two) times daily. 23 g 5   bisoprolol (ZEBETA) 5 MG tablet Take 1.5 tablets (7.5 mg total) by mouth daily. 135 tablet 3   budesonide-formoterol (SYMBICORT) 160-4.5 MCG/ACT inhaler Inhale 2 puffs into the lungs in the morning and at bedtime. 1 each 5   busPIRone (BUSPAR) 10 MG tablet Take 1 tablet (10 mg total) by mouth 2 (two) times daily. 60 tablet 2   calcium carbonate (OS-CAL) 1250 (500 Ca) MG chewable tablet Chew by mouth.     cariprazine (VRAYLAR) 1.5 MG capsule Take 1 capsule (1.5 mg total) by mouth daily. 30 capsule 2   Cholecalciferol 25 MCG (1000 UT) tablet Take 1 Units by mouth daily at 2 PM.     clobetasol (TEMOVATE) 0.05 % external solution APPLY TO AFFECTED AREA TWICE A DAY 50 mL 3   clonazePAM (KLONOPIN) 0.5 MG  tablet Take 1 tablet (0.5 mg total) by mouth 2 (two) times daily as needed for anxiety. 60 tablet 2   dicyclomine (BENTYL) 10 MG capsule Take 10 mg by mouth as  needed for spasms.     EPIPEN 2-PAK 0.3 MG/0.3ML SOAJ injection INJECT 0.3 MG INTO THE MUSCLE AS NEEDED FOR ANAPHYLAXIS. 2 each 2   fexofenadine (ALLEGRA) 180 MG tablet TAKE 1 TABLET BY MOUTH EVERY DAY 90 tablet 2   Galcanezumab-gnlm 120 MG/ML SOAJ Inject into the skin. Emgality     hydrOXYzine (VISTARIL) 25 MG capsule Take 1-2 capsules (25-50 mg total) by mouth daily as needed. 60 capsule 2   levothyroxine (SYNTHROID) 25 MCG tablet Take 1 tablet (25 mcg total) by mouth daily. 90 tablet 3   LINZESS 72 MCG capsule Take 72 mcg by mouth daily.     Loratadine (CLARITIN PO) Take by mouth.     metoprolol tartrate (LOPRESSOR) 25 MG tablet Take 1 tablet (25 mg total) by mouth as needed. 15 tablet 1   montelukast (SINGULAIR) 10 MG tablet Take 1 tablet (10 mg total) by mouth daily. 30 tablet 5   Multiple Vitamin (MULTIVITAMIN) capsule Take by mouth.     norethindrone (MICRONOR) 0.35 MG tablet Take 1 tablet (0.35 mg total) by mouth daily. 28 tablet 11   nystatin-triamcinolone ointment (MYCOLOG) Apply 1 Application topically 2 (two) times daily. 30 g 0   Olopatadine HCl 0.2 % SOLN Apply 1 drop to eye every morning.     ondansetron (ZOFRAN-ODT) 4 MG disintegrating tablet DISSOLVE ON TONGUE AT ONSET OF NAUSEA- 30 DAY SUPPLY 10 tablet 1   pantoprazole (PROTONIX) 40 MG tablet Take 1 tablet by mouth in the morning and at bedtime.     RESTASIS 0.05 % ophthalmic emulsion 1 drop 2 (two) times daily.     sertraline (ZOLOFT) 50 MG tablet Take 1.5 tablets (75 mg total) by mouth daily. 135 tablet 1   SUMAtriptan (IMITREX) 100 MG tablet Take by mouth as needed.     topiramate (TOPAMAX) 50 MG tablet Take 50 mg by mouth 2 (two) times daily.     VENTOLIN HFA 108 (90 Base) MCG/ACT inhaler INHALE 2 PUFFS BY MOUTH EVERY 4 HOURS AS NEEDED FOR WHEEZE OR FOR SHORTNESS OF BREATH 18 each 0   No current facility-administered medications for this visit.     Musculoskeletal: Strength & Muscle Tone: within normal limits Gait &  Station: normal Patient leans: N/A  Psychiatric Specialty Exam: Review of Systems  Neurological:  Positive for headaches.  All other systems reviewed and are negative.   There were no vitals taken for this visit.There is no height or weight on file to calculate BMI.  General Appearance: Casual and Fairly Groomed  Eye Contact:  Good  Speech:  Clear and Coherent  Volume:  Normal  Mood:  Euthymic  Affect:  Congruent  Thought Process:  Goal Directed  Orientation:  Full (Time, Place, and Person)  Thought Content: WDL   Suicidal Thoughts:  No  Homicidal Thoughts:  No  Memory:  Immediate;   Good Recent;   Good Remote;   NA  Judgement:  Good  Insight:  Fair  Psychomotor Activity:  Normal  Concentration:  Concentration: Good and Attention Span: Good  Recall:  Good  Fund of Knowledge: Good  Language: Good  Akathisia:  No  Handed:  Right  AIMS (if indicated): not done  Assets:  Communication Skills Desire for Improvement Resilience Social Support Talents/Skills  ADL's:  Intact  Cognition: WNL  Sleep:  Good   Screenings: GAD-7    Flowsheet Row Office Visit from 08/01/2023 in [facility] Office Visit from 05/18/2023 in [facility] Office Visit from 05/02/2023 in [facility] Office Visit from 04/27/2023 in [facility] Office Visit from 03/06/2023 in [facility]  Total GAD-7 Score 1 0 0 0 0      PHQ2-9    Flowsheet Row Office Visit from 08/01/2023 in [facility] Office Visit from 05/18/2023 in [facility] Office Visit from 05/02/2023 in [facility] Office Visit from 04/27/2023 in [facility] Office Visit from 03/06/2023 in [facility] at  [facility]  PHQ-2 Total Score 0 0 0 0 0  PHQ-9 Total Score 2 1 0 3 3      Flowsheet Row Video Visit from 08/22/2022 in [facility] [place] Video Visit from 06/23/2022 in [facility] at [place] Video Visit from 04/19/2022 in [facility] at [place]  C-SSRS RISK CATEGORY No Risk No Risk No Risk        Assessment and Plan: This patient is a 22 year old female with a history of depression anxiety mood swings sleep difficulties fibromyalgia chronic fatigue and ADD.  Her symptoms are well-controlled at present.  She will continue Strattera 60 mg daily for focus, Vraylar 1.5 mg daily for mood stabilization, Zoloft 75 mg daily for depression, BuSpar 10 mg twice daily for anxiety, hydroxyzine 25 to 50 mg at bedtime as needed for sleep and clonazepam 0.5 mg daily as needed for anxiety.  She will return to see me in 3 months  Collaboration of Care: Collaboration of Care: Primary Care Provider AEB notes are shared with PCP on the epic system  Patient/Guardian was advised Release of Information must be obtained prior to any record release in order to collaborate their care with an outside provider. Patient/Guardian was advised if they have not already done so to contact the registration department to sign all necessary forms in order for [place] to release information regarding their care.   Consent: Patient/Guardian gives verbal consent for treatment and assignment of benefits for services provided during this visit. Patient/Guardian expressed understanding and agreed to proceed.    [name], MD 08/22/2023, 4:06 PM

## 2023-08-29 ENCOUNTER — Encounter: Payer: Self-pay | Admitting: Family Medicine

## 2023-08-29 ENCOUNTER — Ambulatory Visit (INDEPENDENT_AMBULATORY_CARE_PROVIDER_SITE_OTHER): Payer: MEDICAID

## 2023-08-29 VITALS — BP 108/75 | HR 96 | Temp 98.0°F | Ht 59.0 in

## 2023-08-29 DIAGNOSIS — Z111 Encounter for screening for respiratory tuberculosis: Secondary | ICD-10-CM | POA: Diagnosis not present

## 2023-08-29 DIAGNOSIS — M25572 Pain in left ankle and joints of left foot: Secondary | ICD-10-CM

## 2023-08-29 NOTE — Progress Notes (Signed)
Subjective:  Patient ID: Molly [name], female    DOB: 03/19/2001, 22 y.o.   MRN: [id number]  Patient Care Team: Sonny Masters, FNP as PCP - General (Family Medicine) Dellis Anes Hetty Ely, MD as Consulting Physician (Allergy and Immunology) Myrlene Broker, MD as Consulting Physician [facility]) Shaune Pollack, MD as Consulting Physician (Neurology) Alen Bleacher, Winferd Humphrey, MD as Consulting Physician (Pediatric Gastroenterology)   Chief Complaint:  Ankle Pain (Left x 2 weeks )   HPI: Molly [name] is a 22 y.o. female presenting on 08/29/2023 for Ankle Pain (Left x 2 weeks )   Pt reports left ankle pain for 2 weeks. No injury.   Ankle Pain  There was no injury mechanism. The pain is present in the left ankle. The quality of the pain is described as aching. The pain is mild. The pain has been Constant since onset. Pertinent negatives include no inability to bear weight, loss of motion, loss of sensation, muscle weakness, numbness or tingling. She reports no foreign bodies present. The symptoms are aggravated by weight bearing and movement. She has tried acetaminophen and elevation for the symptoms. The treatment provided mild relief.       Relevant past medical, surgical, family, and social history reviewed and updated as indicated.  Allergies and medications reviewed and updated. Data reviewed: Chart in Epic.   Past Medical History:  Diagnosis Date   Acid reflux 2010   Acne vulgaris 2017   ADHD (attention deficit hyperactivity disorder) 2011   Adverse food reaction 07/11/2016   Shellfish allergy   Amenorrhea 2019   Anaphylactic shock due to adverse food reaction 01/15/2018   Anxiety 2017   ASD (atrial septal defect) 11/22/2022   Chronic constipation 09/05/2011   Chronic tension headaches 2011   originally followed by [facility] K.Griffin. Then Dr Alexis Goodell [facility] 03/2020 (see above)    Coalition, calcaneus navicular    Dysmenorrhea     Eczema    Eustachian tube dysfunction, left 10/23/2019   Frequent headaches    Galactorrhea    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 04/01/2019   Hiatal hernia    History of atrial septal defect repair 12/04/2011   Hyperlipidemia 2013   Hypertension 2011   Hypertension 09/28/2020   hypertension, cellulitus    Idiopathic urticaria 09/05/2011   Insomnia    Keratosis pilaris 01/15/2018   Mass of left thigh 11/22/2017   Overview:  Added automatically from request for surgery [id number]   MDD (major depressive disorder), severe (HCC) 03/06/2018   Migraine 2011   re-diagnosed as chronic migraines 03/2020 [facility] - starting on preventative    Obesity without serious comorbidity with body mass index (BMI) in 95th to 98th percentile for age in pediatric patient 09/04/2018   Piriformis syndrome    POTS (postural orthostatic tachycardia syndrome)    PTSD (post-traumatic stress disorder)    Scoliosis 2019   Seasonal and perennial allergic rhinitis 2010   Severe persistent asthma without complication 2010   Sleep paralysis    Suicidal ideation 03/07/2018   Thyroid disease    underactive thyroid   Tic disorder 2019    Past Surgical History:  Procedure Laterality Date   ASD REPAIR  2012   with Helix, via Cardiac Catheterization   atrial septal defecgt     CARDIAC SURGERY     DENTAL SURGERY  2020   lipoma removal  2019   LUMBAR PUNCTURE  05/20/2021   TONSILLECTOMY AND ADENOIDECTOMY  2010  Social History   Socioeconomic History   Marital status: Single    Spouse name: Not on file   Number of children: Not on file   Years of education: Not on file   Highest education level: Not on file  Occupational History   Not on file  Tobacco Use   Smoking status: Never   Smokeless tobacco: Never  Vaping Use   Vaping status: Never Used  Substance and Sexual Activity   Alcohol use: No    Alcohol/week: 0.0 standard drinks of alcohol   Drug use: No   Sexual activity: Never    Birth  control/protection: Pill  Other Topics Concern   Not on file  Social History Narrative   Not on file   Social Determinants of Health   Financial Resource Strain: Low Risk  (03/06/2023)   Overall Financial Resource Strain (CARDIA)    Difficulty of Paying Living Expenses: Not hard at all  Food Insecurity: No Food Insecurity (03/06/2023)   Hunger Vital Sign    Worried About Running Out of Food in the Last Year: Never true    Ran Out of Food in the Last Year: Never true  Transportation Needs: No Transportation Needs (03/06/2023)   PRAPARE - Administrator, Civil Service (Medical): No    Lack of Transportation (Non-Medical): No  Physical Activity: Inactive (03/06/2023)   Exercise Vital Sign    Days of Exercise per Week: 0 days    Minutes of Exercise per Session: 0 min  Stress: No Stress Concern Present (03/06/2023)   [employer] of Occupational Health - Occupational Stress Questionnaire    Feeling of Stress : Only a little  Social Connections: Socially Isolated (03/06/2023)   Social Connection and Isolation Panel [NHANES]    Frequency of Communication with Friends and Family: Three times a week    Frequency of Social Gatherings with Friends and Family: Once a week    Attends Religious Services: Never    Database administrator or Organizations: No    Attends Banker Meetings: Never    Marital Status: Never married  Intimate Partner Violence: Not At Risk (03/06/2023)   Humiliation, Afraid, Rape, and Kick questionnaire    Fear of Current or Ex-Partner: No    Emotionally Abused: No    Physically Abused: No    Sexually Abused: No    Outpatient Encounter Medications as of 08/29/2023  Medication Sig   albuterol (PROVENTIL) (2.5 MG/3ML) 0.083% nebulizer solution Take 3 mLs (2.5 mg total) by nebulization every 6 (six) hours as needed for wheezing or shortness of breath.   ascorbic acid (VITAMIN C) 500 MG tablet Take 500 mg by mouth daily.   atomoxetine (STRATTERA) 60 MG  capsule Take 1 capsule (60 mg total) by mouth daily.   Azelastine-Fluticasone 137-50 MCG/ACT SUSP Place 1 spray into both nostrils 2 (two) times daily.   bisoprolol (ZEBETA) 5 MG tablet Take 1.5 tablets (7.5 mg total) by mouth daily.   budesonide-formoterol (SYMBICORT) 160-4.5 MCG/ACT inhaler Inhale 2 puffs into the lungs in the morning and at bedtime.   busPIRone (BUSPAR) 10 MG tablet Take 1 tablet (10 mg total) by mouth 2 (two) times daily.   calcium carbonate (OS-CAL) 1250 (500 Ca) MG chewable tablet Chew by mouth.   cariprazine (VRAYLAR) 1.5 MG capsule Take 1 capsule (1.5 mg total) by mouth daily.   Cholecalciferol 25 MCG (1000 UT) tablet Take 1 Units by mouth daily at 2 PM.   clobetasol (TEMOVATE) 0.05 %  external solution APPLY TO AFFECTED AREA TWICE A DAY   clonazePAM (KLONOPIN) 0.5 MG tablet Take 1 tablet (0.5 mg total) by mouth 2 (two) times daily as needed for anxiety.   dicyclomine (BENTYL) 10 MG capsule Take 10 mg by mouth as needed for spasms.   EPIPEN 2-PAK 0.3 MG/0.3ML SOAJ injection INJECT 0.3 MG INTO THE MUSCLE AS NEEDED FOR ANAPHYLAXIS.   fexofenadine (ALLEGRA) 180 MG tablet TAKE 1 TABLET BY MOUTH EVERY DAY   Galcanezumab-gnlm 120 MG/ML SOAJ Inject into the skin. Emgality   hydrOXYzine (VISTARIL) 25 MG capsule Take 1-2 capsules (25-50 mg total) by mouth daily as needed.   levothyroxine (SYNTHROID) 25 MCG tablet Take 1 tablet (25 mcg total) by mouth daily.   LINZESS 72 MCG capsule Take 72 mcg by mouth daily.   Loratadine (CLARITIN PO) Take by mouth.   metoprolol tartrate (LOPRESSOR) 25 MG tablet Take 1 tablet (25 mg total) by mouth as needed.   montelukast (SINGULAIR) 10 MG tablet Take 1 tablet (10 mg total) by mouth daily.   Multiple Vitamin (MULTIVITAMIN) capsule Take by mouth.   norethindrone (MICRONOR) 0.35 MG tablet Take 1 tablet (0.35 mg total) by mouth daily.   nystatin-triamcinolone ointment (MYCOLOG) Apply 1 Application topically 2 (two) times daily.   Olopatadine HCl  0.2 % SOLN Apply 1 drop to eye every morning.   ondansetron (ZOFRAN-ODT) 4 MG disintegrating tablet DISSOLVE ON TONGUE AT ONSET OF NAUSEA- 30 DAY SUPPLY   pantoprazole (PROTONIX) 40 MG tablet Take 1 tablet by mouth in the morning and at bedtime.   RESTASIS 0.05 % ophthalmic emulsion 1 drop 2 (two) times daily.   sertraline (ZOLOFT) 50 MG tablet Take 1.5 tablets (75 mg total) by mouth daily.   SUMAtriptan (IMITREX) 100 MG tablet Take by mouth as needed.   topiramate (TOPAMAX) 50 MG tablet Take 50 mg by mouth 2 (two) times daily.   VENTOLIN HFA 108 (90 Base) MCG/ACT inhaler INHALE 2 PUFFS BY MOUTH EVERY 4 HOURS AS NEEDED FOR WHEEZE OR FOR SHORTNESS OF BREATH   No facility-administered encounter medications on file as of 08/29/2023.    Allergies  Allergen Reactions   Glucosamine Forte [Nutritional Supplements] Anaphylaxis    Due to containing shellfish   Nutritional Supplements Anaphylaxis   Oyster Extract Anaphylaxis   Shellfish-Derived Products Anaphylaxis    Throat swelling   Cats Claw (Uncaria Tomentosa) Nausea And Vomiting   Diamox [Acetazolamide] Other (See Comments)    Numbness- feet, hands, lips   Lactose    Lactose Intolerance (Gi)    Topiramate Other (See Comments)    Numbness to face, hands and feet   Bromfed Dm [Pseudoeph-Bromphen-Dm] Anxiety    CARBOFED DM    Review of Systems  Musculoskeletal:  Positive for arthralgias and joint swelling.  Neurological:  Negative for tingling and numbness.  All other systems reviewed and are negative.       Objective:  BP 108/75   Pulse 96   Temp 98 F (36.7 C) (Temporal)   Ht 4\' 11"  (1.499 m)   SpO2 100%   BMI 45.85 kg/m    Wt Readings from Last 3 Encounters:  08/01/23 227 lb (103 kg)  07/05/23 226 lb (102.5 kg) (99%, Z= 2.28)*  07/04/23 228 lb 8 oz (103.6 kg) (99%, Z= 2.31)*   * Growth percentiles are based on CDC (Girls, 2-20 Years) data.    Physical Exam Vitals and nursing note reviewed.  Constitutional:       General: She is  not in acute distress.    Appearance: Normal appearance. She is obese. She is not ill-appearing, toxic-appearing or diaphoretic.  HENT:     Head: Normocephalic and atraumatic.     Nose: Nose normal.     Mouth/Throat:     Mouth: Mucous membranes are moist.  Eyes:     Pupils: Pupils are equal, round, and reactive to light.  Cardiovascular:     Rate and Rhythm: Normal rate and regular rhythm.     Pulses: Normal pulses.     Heart sounds: Normal heart sounds.  Musculoskeletal:     Cervical back: Neck supple.     Left lower leg: Normal.     Left ankle: No swelling, deformity, ecchymosis or lacerations. Tenderness (minimal to lateral ankle) present. Normal range of motion. Anterior drawer test negative. Normal pulse.     Left Achilles Tendon: Normal.     Left foot: Normal.  Skin:    General: Skin is warm and dry.     Capillary Refill: Capillary refill takes less than 2 seconds.  Neurological:     General: No focal deficit present.     Mental Status: She is alert and oriented to person, place, and time.  Psychiatric:        Mood and Affect: Mood normal.        Behavior: Behavior normal.        Thought Content: Thought content normal.        Judgment: Judgment normal.     Results for orders placed or performed in visit on 08/01/23  COVID-19, Flu A+B and RSV   Specimen: Nasopharyngeal(NP) swabs in vial transport medium  Result Value Ref Range   SARS-CoV-2, NAA Not Detected Not Detected   Influenza A, NAA Not Detected Not Detected   Influenza B, NAA Not Detected Not Detected   RSV, NAA Not Detected Not Detected   Test Information: Comment        Pertinent labs & imaging results that were available during my care of the patient were reviewed by me and considered in my medical decision making.  Assessment & Plan:  Deidrea "[name]" was seen today for ankle pain.  Diagnoses and all orders for this visit:  Acute left ankle pain Full ROM without difficulty. No  injury mechanism. No indications of sprain or fracture. RICE therapy discussed in detail. Report new, worsening, or persistent symptoms. Brace applied in office today.   Visit for TB skin test -     TB Skin Test     Continue all other maintenance medications.  Follow up plan: Return if symptoms worsen or fail to improve.   Continue healthy lifestyle choices, including diet (rich in fruits, vegetables, and lean proteins, and low in salt and simple carbohydrates) and exercise (at least 30 minutes of moderate physical activity daily).  Educational handout given for RICE therapy  The above assessment and management plan was discussed with the patient. The patient verbalized understanding of and has agreed to the management plan. Patient is aware to call the clinic if they develop any new symptoms or if symptoms persist or worsen. Patient is aware when to return to the clinic for a follow-up visit. Patient educated on when it is appropriate to go to the emergency department.   Kari Baars, FNP-C [facility] [phone]

## 2023-08-31 ENCOUNTER — Ambulatory Visit: Payer: MEDICAID | Admitting: *Deleted

## 2023-08-31 DIAGNOSIS — Z111 Encounter for screening for respiratory tuberculosis: Secondary | ICD-10-CM

## 2023-08-31 LAB — TB SKIN TEST
Induration: 0 mm
TB Skin Test: NEGATIVE

## 2023-08-31 NOTE — Progress Notes (Signed)
In today for PPD reading which is negative.

## 2023-09-03 DIAGNOSIS — J4550 Severe persistent asthma, uncomplicated: Secondary | ICD-10-CM | POA: Diagnosis not present

## 2023-09-07 ENCOUNTER — Other Ambulatory Visit: Payer: Self-pay

## 2023-09-07 ENCOUNTER — Encounter: Payer: Self-pay | Admitting: Allergy & Immunology

## 2023-09-07 ENCOUNTER — Ambulatory Visit (INDEPENDENT_AMBULATORY_CARE_PROVIDER_SITE_OTHER): Payer: MEDICAID | Admitting: Allergy & Immunology

## 2023-09-07 VITALS — BP 110/50 | HR 101 | Temp 98.2°F | Ht 59.0 in | Wt 228.2 lb

## 2023-09-07 DIAGNOSIS — B999 Unspecified infectious disease: Secondary | ICD-10-CM

## 2023-09-07 DIAGNOSIS — J3089 Other allergic rhinitis: Secondary | ICD-10-CM

## 2023-09-07 DIAGNOSIS — J4550 Severe persistent asthma, uncomplicated: Secondary | ICD-10-CM | POA: Diagnosis not present

## 2023-09-07 DIAGNOSIS — L2084 Intrinsic (allergic) eczema: Secondary | ICD-10-CM | POA: Diagnosis not present

## 2023-09-07 DIAGNOSIS — J302 Other seasonal allergic rhinitis: Secondary | ICD-10-CM

## 2023-09-07 DIAGNOSIS — T7800XD Anaphylactic reaction due to unspecified food, subsequent encounter: Secondary | ICD-10-CM

## 2023-09-07 MED ORDER — BUDESONIDE-FORMOTEROL FUMARATE 160-4.5 MCG/ACT IN AERO: 1.0000 | INHALATION_SPRAY | Freq: Every day | RESPIRATORY_TRACT | 5 refills | Status: DC

## 2023-09-07 MED ORDER — MONTELUKAST SODIUM 10 MG PO TABS: 10.0000 mg | ORAL_TABLET | Freq: Every day | ORAL | 1 refills | Status: DC

## 2023-09-07 MED ORDER — AZELASTINE-FLUTICASONE 137-50 MCG/ACT NA SUSP: 1.0000 | Freq: Two times a day (BID) | NASAL | 5 refills | Status: DC

## 2023-09-07 NOTE — Progress Notes (Unsigned)
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 [name]  Plan/Recommendations:   Assessment and Plan              There are no Patient Instructions on file for this visit.   Subjective:   Molly [name] is a 22 y.o. 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   Insomnia 06/10/2020   Sleep paralysis 06/10/2020   Dysmenorrhea 03/23/2020   Menorrhagia with irregular cycle 03/23/2020   Fatigue 03/23/2020   ADHD (attention deficit hyperactivity disorder) 03/09/2020   Unexplained dysphagia 01/21/2020   Anxiety 10/23/2019   Eustachian tube dysfunction, left 10/23/2019   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 04/01/2019   Obesity  without serious comorbidity with body mass index (BMI) in 95th to 98th percentile for age in pediatric patient 09/04/2018   PTSD (post-traumatic stress disorder) 03/07/2018   MDD (major depressive disorder), severe (HCC) 03/06/2018   Keratosis pilaris 01/15/2018   Severe persistent asthma, uncomplicated 01/15/2018   Intrinsic atopic dermatitis 01/15/2018   Seasonal and perennial allergic rhinitis 01/15/2018   Anaphylactic shock due to adverse food reaction 01/15/2018   Tic disorder 2019   Scoliosis 2019   Adverse food reaction 07/11/2016   Chronic migraine w/o aura w/o status migrainosus, not intractable 05/07/2012   Hyperlipidemia 2013   History of atrial septal defect repair 12/04/2011   Idiopathic urticaria 09/05/2011   Chronic constipation 09/05/2011   Hypertension 2011    History obtained from: chart review and {Persons; PED relatives w/patient:19415::"patient"}.  Discussed the use of AI scribe software for clinical note transcription with the patient and/or guardian, who gave verbal consent to proceed.  Sruti is a 22 y.o. female presenting for {Blank single:19197::"a food challenge","a drug challenge","skin testing","a sick visit","an evaluation of ***","a follow up visit"}.  She was last seen in March 2024.  At that time, we continue with triamcinolone and moisturizing for her atopic dermatitis.  For her asthma, her lung testing looked amazing.  We continued with Symbicort 160 mcg 1 puff once daily and then as needed.  For her rhinitis, we continue with Singulair as well as Dymista and Allegra and Claritin.  For her food allergies, she continue to avoid additional blueberry.  For dysautonomia, we  referred her to endocrinology and attempted to get a home sleep study.  Since the last visit,   History of Present Illness            {Blank single:19197::"Asthma/Respiratory Symptom History: ***"," "}  {Blank single:19197::"Allergic Rhinitis Symptom History: ***"," "}  {Blank  single:19197::"Food Allergy Symptom History: ***"," "}  {Blank single:19197::"Skin Symptom History: ***"," "}  {Blank single:19197::"GERD Symptom History: ***"," "}  Otherwise, there have been no changes to her past medical history, surgical history, family history, or social history.    Review of systems otherwise negative other than that mentioned in the HPI.    Objective:   There were no vitals taken for this visit. There is no height or weight on file to calculate BMI.    Physical Exam   Diagnostic studies:    Spirometry: results normal (FEV1: 2.44/87%, FVC: 2.97/100%, FEV1/FVC: 82%).    Spirometry consistent with normal pattern. {Blank single:19197::"Albuterol/Atrovent nebulizer","Xopenex/Atrovent nebulizer","Albuterol nebulizer","Albuterol four puffs via MDI","Xopenex four puffs via MDI"} treatment given in clinic with {Blank single:19197::"significant improvement in FEV1 per ATS criteria","significant improvement in FVC per ATS criteria","significant improvement in FEV1 and FVC per ATS criteria","improvement in FEV1, but not significant per ATS criteria","improvement in FVC, but not significant per ATS criteria","improvement in FEV1 and FVC, but not significant per ATS criteria","no improvement"}.  Allergy Studies: {Blank single:19197::"none","labs sent instead"," "}    {Blank single:19197::"Allergy testing results were read and interpreted by myself, documented by clinical staff."," "}      Malachi Bonds, MD  [facility]

## 2023-09-07 NOTE — Patient Instructions (Addendum)
1. Intrinsic atopic dermatitis  - Continue with the triamcinolone ointment as needed.  - Continue with moisturizing twice daily.   2. Moderate persistent asthma, uncomplicated - Lung testing looked AMAZING today!  - We will change to the SMART therapy. - Daily controller medication(s): Symbicort 160/4.5 mcg one puff once daily - Rescue medications: Symbicort 160/4.21mcg two puffs every 4-6 hours as needed - Asthma control goals:  * Full participation in all desired activities (may need albuterol before activity) * Albuterol use two time or less a week on average (not counting use with activity) * Cough interfering with sleep two time or less a month * Oral steroids no more than once a year * No hospitalizations  3. Chronic allergic rhinitis (indoor molds, outdoor molds, dust mites and cat) - Your nose looks great today!  - Continue with: Singulair (montelukast) 10mg  daily and Dymista (fluticasone/azelastine) two sprays per nostril 1-2 times daily as needed  - Continue with: Allegra and Claritin as you are doing.  - Consider nasal saline rinses 1-2 times daily to remove allergens from the nasal cavities as well as help with mucous clearance (this is especially helpful to do before the nasal sprays are given)  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  - Refill sent in today.   5. Dysautonomia - It looks like you are plugged into all of the specialists.  - DRINK MORE WATER!  - Good work on the weight loss.   6. Return in about 6 months (around 03/07/2024).     Please inform us of any Emergency Department visits, hospitalizations, or changes in symptoms. Call us before going to the ED for breathing or allergy symptoms since we might be able to fit you in for a sick visit. Feel free to contact us anytime with any questions, problems, or concerns.  It was a pleasure to see you again today!  Websites that have reliable patient information: 1. American Academy  of Asthma, Allergy, and Immunology: www.aaaai.org 2. Food Allergy Research and Education (FARE): foodallergy.org 3. Mothers of Asthmatics: http://www.asthmacommunitynetwork.org 4. American College of Allergy, Asthma, and Immunology: www.acaai.org   COVID-19 Vaccine Information can be found at: [email or web address] For questions related to vaccine distribution or appointments, please email [email or web address] or call [phone].   We realize that you might be concerned about having an allergic reaction to the COVID19 vaccines. To help with that concern, WE ARE OFFERING THE COVID19 VACCINES IN OUR OFFICE! Ask the front desk for dates!     "Like" [place] on Facebook and Instagram for our latest updates!      A healthy democracy works best when [employer] participate! Make sure you are registered to vote! If you have moved or changed any of your contact information, you will need to get this updated before voting!  In some cases, you MAY be able to register to vote online: [email or web address]

## 2023-09-08 ENCOUNTER — Encounter (HOSPITAL_COMMUNITY): Payer: Self-pay

## 2023-09-08 ENCOUNTER — Encounter: Payer: Self-pay | Admitting: Family Medicine

## 2023-09-12 ENCOUNTER — Ambulatory Visit (INDEPENDENT_AMBULATORY_CARE_PROVIDER_SITE_OTHER): Payer: MEDICAID | Admitting: Adult Health

## 2023-09-12 ENCOUNTER — Encounter: Payer: Self-pay | Admitting: Adult Health

## 2023-09-12 VITALS — BP 125/79 | HR 92 | Ht 59.0 in | Wt 229.5 lb

## 2023-09-12 DIAGNOSIS — R102 Pelvic and perineal pain: Secondary | ICD-10-CM | POA: Insufficient documentation

## 2023-09-12 DIAGNOSIS — M5441 Lumbago with sciatica, right side: Secondary | ICD-10-CM | POA: Insufficient documentation

## 2023-09-12 DIAGNOSIS — E282 Polycystic ovarian syndrome: Secondary | ICD-10-CM

## 2023-09-12 NOTE — Progress Notes (Signed)
  Subjective:     Patient ID: [name], female   DOB: December 25, 2000, 22 y.o.   MRN: [id number]  HPI [name] is a 22 year old white female, single, G0P0, in for follow up on PCO, on Micronor and period not regular and light, but having random pain low pelvic area and low back.  PCP is Gilford Silvius FNP  Review of Systems Random pelvic pain and pain low back Has never had sex  Reviewed past medical,surgical, social and family history. Reviewed medications and allergies.     Objective:   Physical Exam BP 125/79 (BP Location: Right Arm, Patient Position: Sitting, Cuff Size: Normal)   Pulse 92   Ht 4\' 11"  (1.499 m)   Wt 229 lb 8 oz (104.1 kg)   LMP 07/14/2023   BMI 46.35 kg/m     Skin warm and dry.  Lungs: clear to ausculation bilaterally. Cardiovascular: regular rate and rhythm.  Upstream - 09/12/23 1513       Pregnancy Intention Screening   Does the patient want to become pregnant in the next year? No    Does the patient's partner want to become pregnant in the next year? No    Would the patient like to discuss contraceptive options today? No      Contraception Wrap Up   Current Method Abstinence;Oral Contraceptive    End Method Abstinence;Oral Contraceptive    Contraception Counseling Provided Yes              Assessment:     1. Pelvic pain Has random pain across low pelvic area and in low back,no pain today  Heat and ES tylenol no help, can't take ibuprofen She wonders if PCO related, I tried to explain I did not think it was Will get appt with Dr Charlotta Newton   2. PCO (polycystic ovaries) She is on micronor and periods light and not regular  3. Bilateral low back pain with right-sided sciatica, unspecified chronicity Has right sciatica at times  Try ice to see if helps    Plan:     Return 10/03/23 to see Dr Charlotta Newton

## 2023-09-14 ENCOUNTER — Other Ambulatory Visit (HOSPITAL_COMMUNITY): Payer: Self-pay | Admitting: Psychiatry

## 2023-09-14 ENCOUNTER — Encounter: Payer: Self-pay | Admitting: Family Medicine

## 2023-09-14 ENCOUNTER — Ambulatory Visit (INDEPENDENT_AMBULATORY_CARE_PROVIDER_SITE_OTHER): Payer: MEDICAID | Admitting: Family Medicine

## 2023-09-14 VITALS — BP 104/70 | HR 99 | Temp 98.2°F | Ht 59.0 in | Wt 227.0 lb

## 2023-09-14 DIAGNOSIS — L600 Ingrowing nail: Secondary | ICD-10-CM

## 2023-09-14 DIAGNOSIS — L03031 Cellulitis of right toe: Secondary | ICD-10-CM | POA: Diagnosis not present

## 2023-09-14 MED ORDER — DOXYCYCLINE HYCLATE 100 MG PO TABS
100.0000 mg | ORAL_TABLET | Freq: Two times a day (BID) | ORAL | 0 refills | Status: AC
Start: 2023-09-14 — End: 2023-09-21

## 2023-09-14 MED ORDER — FLUCONAZOLE 150 MG PO TABS
150.0000 mg | ORAL_TABLET | Freq: Once | ORAL | 0 refills | Status: AC
Start: 2023-09-14 — End: 2023-09-14

## 2023-09-14 NOTE — Progress Notes (Signed)
Subjective:  Patient ID: Molly [name], female    DOB: Jul 03, 2001, 22 y.o.   MRN: [id number]  Patient Care Team: Sonny Masters, FNP as PCP - General (Family Medicine) Dellis Anes Hetty Ely, MD as Consulting Physician (Allergy and Immunology) Myrlene Broker, MD as Consulting Physician [facility]) Shaune Pollack, MD as Consulting Physician (Neurology) Alen Bleacher, Winferd Humphrey, MD as Consulting Physician (Pediatric Gastroenterology)   Chief Complaint:  infected toe (Right big toe/)  HPI: Molly [name] is a 22 y.o. female presenting on 09/14/2023 for infected toe (Right big toe/)  HPI States that she feels she has an ingrown toenail again. She was previously seen by podiatry with Dr. Allena Katz. States that it was extracted at that time.  States that there is a lot of pain and all over and that the "bone" was painful.  Endorses redness, swelling, drainage with pus. States that it started 5 days ago. Has tried cleaning with peroxide and neosporin. Epsom salt soaks.   Relevant past medical, surgical, family, and social history reviewed and updated as indicated.  Allergies and medications reviewed and updated. Data reviewed: Chart in Epic.   Past Medical History:  Diagnosis Date   Acid reflux 2010   Acne vulgaris 2017   ADHD (attention deficit hyperactivity disorder) 2011   Adverse food reaction 07/11/2016   Shellfish allergy   Amenorrhea 2019   Anaphylactic shock due to adverse food reaction 01/15/2018   Anxiety 2017   ASD (atrial septal defect) 11/22/2022   Chronic constipation 09/05/2011   Chronic tension headaches 2011   originally followed by [facility] K.Griffin. Then Dr Alexis Goodell [facility] Neuro 03/2020 (see above)    Coalition, calcaneus navicular    Dysmenorrhea    Eczema    Eustachian tube dysfunction, left 10/23/2019   Frequent headaches    Galactorrhea    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 04/01/2019   Hiatal hernia    History of atrial septal  defect repair 12/04/2011   Hyperlipidemia 2013   Hypertension 2011   Hypertension 09/28/2020   hypertension, cellulitus    Idiopathic urticaria 09/05/2011   Insomnia    Keratosis pilaris 01/15/2018   Mass of left thigh 11/22/2017   Overview:  Added automatically from request for surgery [id number]   MDD (major depressive disorder), severe (HCC) 03/06/2018   Migraine 2011   re-diagnosed as chronic migraines 03/2020 [facility] - starting on preventative    Obesity without serious comorbidity with body mass index (BMI) in 95th to 98th percentile for age in pediatric patient 09/04/2018   PCOS (polycystic ovarian syndrome)    Piriformis syndrome    POTS (postural orthostatic tachycardia syndrome)    PTSD (post-traumatic stress disorder)    Scoliosis 2019   Seasonal and perennial allergic rhinitis 2010   Severe persistent asthma without complication 2010   Sleep paralysis    Suicidal ideation 03/07/2018   Thyroid disease    underactive thyroid   Tic disorder 2019    Past Surgical History:  Procedure Laterality Date   ASD REPAIR  2012   with Helix, via Cardiac Catheterization   atrial septal defecgt     CARDIAC SURGERY     DENTAL SURGERY  2020   lipoma removal  2019   LUMBAR PUNCTURE  05/20/2021   TONSILLECTOMY AND ADENOIDECTOMY  2010    Social History   Socioeconomic History   Marital status: Single    Spouse name: Not on file   Number of children: Not on file  Years of education: Not on file   Highest education level: Not on file  Occupational History   Not on file  Tobacco Use   Smoking status: Never   Smokeless tobacco: Never  Vaping Use   Vaping status: Never Used  Substance and Sexual Activity   Alcohol use: No    Alcohol/week: 0.0 standard drinks of alcohol   Drug use: No   Sexual activity: Never    Birth control/protection: Pill  Other Topics Concern   Not on file  Social History Narrative   Not on file   Social Determinants of Health   Financial  Resource Strain: Low Risk  (03/06/2023)   Overall Financial Resource Strain (CARDIA)    Difficulty of Paying Living Expenses: Not hard at all  Food Insecurity: No Food Insecurity (03/06/2023)   Hunger Vital Sign    Worried About Running Out of Food in the Last Year: Never true    Ran Out of Food in the Last Year: Never true  Transportation Needs: No Transportation Needs (03/06/2023)   PRAPARE - Administrator, Civil Service (Medical): No    Lack of Transportation (Non-Medical): No  Physical Activity: Inactive (03/06/2023)   Exercise Vital Sign    Days of Exercise per Week: 0 days    Minutes of Exercise per Session: 0 min  Stress: No Stress Concern Present (03/06/2023)   [employer] of Occupational Health - Occupational Stress Questionnaire    Feeling of Stress : Only a little  Social Connections: Socially Isolated (03/06/2023)   Social Connection and Isolation Panel [NHANES]    Frequency of Communication with Friends and Family: Three times a week    Frequency of Social Gatherings with Friends and Family: Once a week    Attends Religious Services: Never    Database administrator or Organizations: No    Attends Banker Meetings: Never    Marital Status: Never married  Intimate Partner Violence: Not At Risk (03/06/2023)   Humiliation, Afraid, Rape, and Kick questionnaire    Fear of Current or Ex-Partner: No    Emotionally Abused: No    Physically Abused: No    Sexually Abused: No    Outpatient Encounter Medications as of 09/14/2023  Medication Sig   albuterol (PROVENTIL) (2.5 MG/3ML) 0.083% nebulizer solution Take 3 mLs (2.5 mg total) by nebulization every 6 (six) hours as needed for wheezing or shortness of breath.   ascorbic acid (VITAMIN C) 500 MG tablet Take 500 mg by mouth daily.   atomoxetine (STRATTERA) 60 MG capsule Take 1 capsule (60 mg total) by mouth daily.   Azelastine-Fluticasone 137-50 MCG/ACT SUSP Place 1 spray into both nostrils 2 (two) times  daily.   bisoprolol (ZEBETA) 5 MG tablet Take 1.5 tablets (7.5 mg total) by mouth daily.   budesonide-formoterol (SYMBICORT) 160-4.5 MCG/ACT inhaler Inhale 1 puff into the lungs daily.   busPIRone (BUSPAR) 10 MG tablet Take 1 tablet (10 mg total) by mouth 2 (two) times daily.   calcium carbonate (OS-CAL) 1250 (500 Ca) MG chewable tablet Chew by mouth.   cariprazine (VRAYLAR) 1.5 MG capsule Take 1 capsule (1.5 mg total) by mouth daily.   Cholecalciferol 25 MCG (1000 UT) tablet Take 1 Units by mouth daily at 2 PM.   clobetasol (TEMOVATE) 0.05 % external solution APPLY TO AFFECTED AREA TWICE A DAY   clonazePAM (KLONOPIN) 0.5 MG tablet Take 1 tablet (0.5 mg total) by mouth 2 (two) times daily as needed for anxiety.  dicyclomine (BENTYL) 10 MG capsule Take 10 mg by mouth as needed for spasms.   EPIPEN 2-PAK 0.3 MG/0.3ML SOAJ injection INJECT 0.3 MG INTO THE MUSCLE AS NEEDED FOR ANAPHYLAXIS.   fexofenadine (ALLEGRA) 180 MG tablet TAKE 1 TABLET BY MOUTH EVERY DAY   Galcanezumab-gnlm 120 MG/ML SOAJ Inject into the skin. Emgality   hydrOXYzine (VISTARIL) 25 MG capsule Take 1-2 capsules (25-50 mg total) by mouth daily as needed.   levothyroxine (SYNTHROID) 25 MCG tablet Take 1 tablet (25 mcg total) by mouth daily.   LINZESS 72 MCG capsule Take 72 mcg by mouth daily.   Loratadine (CLARITIN PO) Take by mouth.   metoprolol tartrate (LOPRESSOR) 25 MG tablet Take 1 tablet (25 mg total) by mouth as needed.   montelukast (SINGULAIR) 10 MG tablet Take 1 tablet (10 mg total) by mouth daily.   Multiple Vitamin (MULTIVITAMIN) capsule Take by mouth.   norethindrone (MICRONOR) 0.35 MG tablet Take 1 tablet (0.35 mg total) by mouth daily.   Olopatadine HCl 0.2 % SOLN Apply 1 drop to eye every morning.   ondansetron (ZOFRAN-ODT) 4 MG disintegrating tablet DISSOLVE ON TONGUE AT ONSET OF NAUSEA- 30 DAY SUPPLY   pantoprazole (PROTONIX) 40 MG tablet Take 1 tablet by mouth in the morning and at bedtime.   RESTASIS 0.05 %  ophthalmic emulsion 1 drop 2 (two) times daily.   sertraline (ZOLOFT) 50 MG tablet Take 1.5 tablets (75 mg total) by mouth daily.   SUMAtriptan (IMITREX) 100 MG tablet Take by mouth as needed.   topiramate (TOPAMAX) 50 MG tablet Take 50 mg by mouth 2 (two) times daily.   VENTOLIN HFA 108 (90 Base) MCG/ACT inhaler INHALE 2 PUFFS BY MOUTH EVERY 4 HOURS AS NEEDED FOR WHEEZE OR FOR SHORTNESS OF BREATH   No facility-administered encounter medications on file as of 09/14/2023.    Allergies  Allergen Reactions   Glucosamine Forte [Nutritional Supplements] Anaphylaxis    Due to containing shellfish   Nutritional Supplements Anaphylaxis   Oyster Extract Anaphylaxis   Shellfish-Derived Products Anaphylaxis    Throat swelling   Cats Claw (Uncaria Tomentosa) Nausea And Vomiting   Diamox [Acetazolamide] Other (See Comments)    Numbness- feet, hands, lips   Lactose    Lactose Intolerance (Gi)    Topiramate Other (See Comments)    Numbness to face, hands and feet   Bromfed Dm [Pseudoeph-Bromphen-Dm] Anxiety    CARBOFED DM    Review of Systems As per HPI Objective:  BP 104/70   Pulse 99   Temp 98.2 F (36.8 C)   Ht 4\' 11"  (1.499 m)   Wt 227 lb (103 kg)   LMP 07/14/2023   SpO2 99%   BMI 45.85 kg/m    Wt Readings from Last 3 Encounters:  09/14/23 227 lb (103 kg)  09/12/23 229 lb 8 oz (104.1 kg)  09/07/23 228 lb 3.2 oz (103.5 kg)    Physical Exam Constitutional:      General: She is awake. She is not in acute distress.    Appearance: Normal appearance. She is well-developed and well-groomed. She is morbidly obese. She is not ill-appearing, toxic-appearing or diaphoretic.  Cardiovascular:     Rate and Rhythm: Normal rate and regular rhythm.     Pulses: Normal pulses.          Radial pulses are 2+ on the right side and 2+ on the left side.       Posterior tibial pulses are 2+ on the right side and  2+ on the left side.     Heart sounds: Normal heart sounds. No murmur heard.    No  gallop.  Pulmonary:     Effort: Pulmonary effort is normal. No respiratory distress.     Breath sounds: Normal breath sounds. No stridor. No wheezing, rhonchi or rales.  Musculoskeletal:     Cervical back: Full passive range of motion without pain and neck supple.     Right lower leg: No edema.     Left lower leg: No edema.  Skin:    General: Skin is warm.     Capillary Refill: Capillary refill takes less than 2 seconds.     Comments: Medial aspect of right great toe with open, sanguinous, purulent drainage. Erythema surrounding site.    Neurological:     General: No focal deficit present.     Mental Status: She is alert, oriented to person, place, and time and easily aroused. Mental status is at baseline.     GCS: GCS eye subscore is 4. GCS verbal subscore is 5. GCS motor subscore is 6.     Motor: No weakness.  Psychiatric:        Attention and Perception: Attention and perception normal.        Mood and Affect: Mood and affect normal.        Speech: Speech normal.        Behavior: Behavior normal. Behavior is cooperative.        Thought Content: Thought content normal. Thought content does not include homicidal or suicidal ideation. Thought content does not include homicidal or suicidal plan.        Cognition and Memory: Cognition and memory normal.        Judgment: Judgment normal.     Results for orders placed or performed in visit on 08/29/23  TB Skin Test  Result Value Ref Range   TB Skin Test Negative    Induration 0 mm       09/14/2023   11:26 AM 08/01/2023    2:54 PM 05/18/2023   11:40 AM 05/02/2023    2:22 PM 04/27/2023   12:03 PM  Depression screen PHQ 2/9  Decreased Interest 0 0 0 0 0  Down, Depressed, Hopeless 0 0 0 0 0  PHQ - 2 Score 0 0 0 0 0  Altered sleeping 0 0 0 0 1  Tired, decreased energy 0 1 0 0 1  Change in appetite 0 1 1 0 1  Feeling bad or failure about yourself  0 0 0 0 0  Trouble concentrating 0 0 0 0 0  Moving slowly or fidgety/restless 0 0 0  0 0  Suicidal thoughts 0 0 0 0 0  PHQ-9 Score 0 2 1 0 3  Difficult doing work/chores Not difficult at all Not difficult at all Not difficult at all Not difficult at all Not difficult at all       09/14/2023   11:26 AM 08/01/2023    2:55 PM 05/18/2023   11:40 AM 05/02/2023    2:22 PM  GAD 7 : Generalized Anxiety Score  Nervous, Anxious, on Edge 0 0 0 0  Control/stop worrying 0 0 0 0  Worry too much - different things 0 0 0 0  Trouble relaxing 0 1 0 0  Restless 0 0 0 0  Easily annoyed or irritable 0 0 0 0  Afraid - awful might happen 0 0 0 0  Total GAD 7 Score 0 1 0  0  Anxiety Difficulty Not difficult at all Not difficult at all Not difficult at all Not difficult at all   Pertinent labs & imaging results that were available during my care of the patient were reviewed by me and considered in my medical decision making.  Assessment & Plan:  Molly "[name]" was seen today for infected toe.  Diagnoses and all orders for this visit:  Ingrown right greater toenail Antibiotics as below. Will provide prophylactic diflucan as well. Referral placed to podiatry for repeat excision of ingrown toenail. Patient instructed to call them as well as they were seen in the last 3 years.  -     Ambulatory referral to Podiatry -     doxycycline (VIBRA-TABS) 100 MG tablet; Take 1 tablet (100 mg total) by mouth 2 (two) times daily for 7 days. -     fluconazole (DIFLUCAN) 150 MG tablet; Take 1 tablet (150 mg total) by mouth once for 1 dose. May take second dose 72 hours after first dose if symptoms remain  Paronychia of great toe of right foot As above.  -     Ambulatory referral to Podiatry -     doxycycline (VIBRA-TABS) 100 MG tablet; Take 1 tablet (100 mg total) by mouth 2 (two) times daily for 7 days. -     fluconazole (DIFLUCAN) 150 MG tablet; Take 1 tablet (150 mg total) by mouth once for 1 dose. May take second dose 72 hours after first dose if symptoms remain    Continue all other maintenance  medications.  Follow up plan: Return if symptoms worsen or fail to improve.  Continue healthy lifestyle choices, including diet (rich in fruits, vegetables, and lean proteins, and low in salt and simple carbohydrates) and exercise (at least 30 minutes of moderate physical activity daily).  Written and verbal instructions provided   The above assessment and management plan was discussed with the patient. The patient verbalized understanding of and has agreed to the management plan. Patient is aware to call the clinic if they develop any new symptoms or if symptoms persist or worsen. Patient is aware when to return to the clinic for a follow-up visit. Patient educated on when it is appropriate to go to the emergency department.   Neale Burly, DNP-FNP [facility] [address] [place], [place] [address] [phone]

## 2023-09-26 ENCOUNTER — Ambulatory Visit (INDEPENDENT_AMBULATORY_CARE_PROVIDER_SITE_OTHER): Payer: MEDICAID | Admitting: Obstetrics & Gynecology

## 2023-09-26 ENCOUNTER — Encounter: Payer: Self-pay | Admitting: Obstetrics & Gynecology

## 2023-09-26 VITALS — BP 129/89 | HR 101 | Wt 227.0 lb

## 2023-09-26 DIAGNOSIS — K5909 Other constipation: Secondary | ICD-10-CM | POA: Diagnosis not present

## 2023-09-26 DIAGNOSIS — R102 Pelvic and perineal pain: Secondary | ICD-10-CM

## 2023-09-26 DIAGNOSIS — E282 Polycystic ovarian syndrome: Secondary | ICD-10-CM | POA: Diagnosis not present

## 2023-09-26 NOTE — Progress Notes (Signed)
GYN VISIT Patient name: LUCRESHA CARLYON MRN [id number]  Date of birth: 09/18/2001 Chief Complaint:   Gynecologic Exam (Pelvic pain since 2020. )  History of Present Illness:   Molly [name] is a 22 y.o. G0P0000 female being seen today for the following concerns:  -Pelvic pain: she notes lower stomach, hip and back pain.  Pain is intermittent, sometimes for hours or even all day.  Typically taking tylenol for pain.  Not taking NSAIDs due to GI issues.     Previously on monophasic COCs, currently on POPs.  Typically menses are irregular- not every month.  Menses may last for 3-5 days with passage of only clots with quarter-sized clots.  Notes minimal improvement with tylenol.  [place] completed 06/2023- Findings suggest PCOS, no other abnormalities noted.  Pt notes GI issues including constipation, hiatal hernia and external hemorrhoids.  Pt also followed by derm, neuro and Endo due to multiple co-morbidities including POTS, migraines without aura  Patient's last menstrual period was 09/13/2023 (exact date).    Review of Systems:   Pertinent items are noted in HPI Denies fever/chills, dizziness, headaches, visual disturbances, fatigue, shortness of breath, chest pain. Pertinent History Reviewed:   Past Surgical History:  Procedure Laterality Date   ASD REPAIR  2012   with Helix, via Cardiac Catheterization   atrial septal defecgt     CARDIAC SURGERY     DENTAL SURGERY  2020   lipoma removal  2019   LUMBAR PUNCTURE  05/20/2021   TONSILLECTOMY AND ADENOIDECTOMY  2010    Past Medical History:  Diagnosis Date   Acid reflux 2010   Acne vulgaris 2017   ADHD (attention deficit hyperactivity disorder) 2011   Adverse food reaction 07/11/2016   Shellfish allergy   Amenorrhea 2019   Anaphylactic shock due to adverse food reaction 01/15/2018   Anxiety 2017   ASD (atrial septal defect) 11/22/2022   Chronic constipation 09/05/2011   Chronic tension headaches 2011    originally followed by [facility] K.Griffin. Then Dr Alexis Goodell [facility] 03/2020 (see above)    Coalition, calcaneus navicular    Dysmenorrhea    Eczema    Eustachian tube dysfunction, left 10/23/2019   Frequent headaches    Galactorrhea    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 04/01/2019   Hiatal hernia    History of atrial septal defect repair 12/04/2011   Hyperlipidemia 2013   Hypertension 2011   Hypertension 09/28/2020   hypertension, cellulitus    Idiopathic urticaria 09/05/2011   Insomnia    Keratosis pilaris 01/15/2018   Mass of left thigh 11/22/2017   Overview:  Added automatically from request for surgery [id number]   MDD (major depressive disorder), severe (HCC) 03/06/2018   Migraine 2011   re-diagnosed as chronic migraines 03/2020 [facility] - starting on preventative    Obesity without serious comorbidity with body mass index (BMI) in 95th to 98th percentile for age in pediatric patient 09/04/2018   PCOS (polycystic ovarian syndrome)    Piriformis syndrome    POTS (postural orthostatic tachycardia syndrome)    PTSD (post-traumatic stress disorder)    Scoliosis 2019   Seasonal and perennial allergic rhinitis 2010   Severe persistent asthma without complication 2010   Sleep paralysis    Suicidal ideation 03/07/2018   Thyroid disease    underactive thyroid   Tic disorder 2019   Reviewed problem list, medications and allergies. Physical Assessment:   Vitals:   09/26/23 1505  BP: 129/89  Pulse: (!) 101  Weight: 227 lb (103 kg)  Body mass index is 45.85 kg/m.       Physical Examination:   General appearance: alert, well appearing, and in no distress  Psych: mood appropriate, normal affect  Skin: diaphoretic today due to POTS  Cardiovascular: normal heart rate noted  Respiratory: normal respiratory effort, no distress  Abdomen: obese, soft, non-tender   Pelvic: VULVA: normal appearing vulva with no masses, tenderness or lesions, VAGINA: normal appearing vagina with normal  color and discharge, no lesions, UTERUS: uterus is normal size, shape, consistency and nontender, ADNEXA: normal adnexa in size, nontender and no masses  Extremities: no edema, no calf tenderness bilaterally  Chaperone:  mother present for exam     Assessment & Plan:  1) Pelvic pain, PCOS, multiple co-morbidities -pt unable to take NSAIDs -currently on POPs and menses seemed fairly well controlled with current medication -discussed concerns regarding endometriosis- reviewed diagnosis of exclusions and discussed that "gold standard" would be surgical intervention -reviewed that surgery is typically reserved for failed medical management and/or infertility -discussed Myfembree and Dewayne Hatch as an additional option -also suspect that her other co-morbidities are a contributing factor -long discussion with patient regarding diet choices, inflammatory changes and encouraged pt to consider prior recommendations of plant-based, WHOLE30, FODMAP or other elimination diet -also encouraged supplements such as ionsitol, vitex, etc.  []  may consider Myfembree at next visit pending nutritional/supplement changes    Return in about 3 months (around 12/27/2023) for 3-6 mos follow up .   Myna Hidalgo, DO Attending Obstetrician & Gynecologist, [facility] for [employer], [facility]

## 2023-10-04 DIAGNOSIS — J4550 Severe persistent asthma, uncomplicated: Secondary | ICD-10-CM | POA: Diagnosis not present

## 2023-10-05 ENCOUNTER — Other Ambulatory Visit: Payer: MEDICAID

## 2023-10-09 LAB — METANEPHRINES, PLASMA: Normetanephrine, Free: 66.8 pg/mL (ref 0.0–210.1)

## 2023-10-09 LAB — TSH: TSH: 3.73 u[IU]/mL (ref 0.450–4.500)

## 2023-10-09 LAB — T4, FREE: Free T4: 1.12 ng/dL (ref 0.82–1.77)

## 2023-10-11 ENCOUNTER — Encounter: Payer: Self-pay | Admitting: "Endocrinology

## 2023-10-11 ENCOUNTER — Ambulatory Visit: Payer: MEDICAID | Admitting: "Endocrinology

## 2023-10-11 VITALS — BP 108/62 | HR 64 | Ht 59.0 in | Wt 223.6 lb

## 2023-10-11 DIAGNOSIS — E039 Hypothyroidism, unspecified: Secondary | ICD-10-CM | POA: Diagnosis not present

## 2023-10-11 DIAGNOSIS — R7303 Prediabetes: Secondary | ICD-10-CM | POA: Diagnosis not present

## 2023-10-11 DIAGNOSIS — E782 Mixed hyperlipidemia: Secondary | ICD-10-CM | POA: Diagnosis not present

## 2023-10-11 LAB — POCT GLYCOSYLATED HEMOGLOBIN (HGB A1C): Hemoglobin A1C: 5.1 % (ref 4.0–5.6)

## 2023-10-11 MED ORDER — LEVOTHYROXINE SODIUM 50 MCG PO TABS: 50.0000 ug | ORAL_TABLET | Freq: Every day | ORAL | 1 refills | Status: DC

## 2023-10-11 NOTE — Progress Notes (Signed)
10/11/2023, 7:02 PM  Endocrinology follow-up note   Subjective:    Patient ID: Molly [name], female    DOB: [birth year], PCP Sonny Masters, FNP   Past Medical History:  Diagnosis Date   Acid reflux 2010   Acne vulgaris 2017   ADHD (attention deficit hyperactivity disorder) 2011   Adverse food reaction 07/11/2016   Shellfish allergy   Amenorrhea 2019   Anaphylactic shock due to adverse food reaction 01/15/2018   Anxiety 2017   ASD (atrial septal defect) 11/22/2022   Chronic constipation 09/05/2011   Chronic tension headaches 2011   originally followed by [facility] K.Griffin. Then Dr Alexis Goodell [facility] 03/2020 (see above)    Coalition, calcaneus navicular    Dysmenorrhea    Eczema    Eustachian tube dysfunction, left 10/23/2019   Frequent headaches    Galactorrhea    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 04/01/2019   Hiatal hernia    History of atrial septal defect repair 12/04/2011   Hyperlipidemia 2013   Hypertension 2011   Hypertension 09/28/2020   hypertension, cellulitus    Idiopathic urticaria 09/05/2011   Insomnia    Keratosis pilaris 01/15/2018   Mass of left thigh 11/22/2017   Overview:  Added automatically from request for surgery [id number]   MDD (major depressive disorder), severe (HCC) 03/06/2018   Migraine 2011   re-diagnosed as chronic migraines 03/2020 [facility] - starting on preventative    Obesity without serious comorbidity with body mass index (BMI) in 95th to 98th percentile for age in pediatric patient 09/04/2018   PCOS (polycystic ovarian syndrome)    Piriformis syndrome    POTS (postural orthostatic tachycardia syndrome)    PTSD (post-traumatic stress disorder)    Scoliosis 2019   Seasonal and perennial allergic rhinitis 2010   Severe persistent asthma without complication 2010   Sleep paralysis    Suicidal ideation 03/07/2018   Thyroid disease    underactive thyroid   Tic disorder 2019    Past Surgical History:  Procedure Laterality Date   ASD REPAIR  2012   with Helix, via Cardiac Catheterization   atrial septal defecgt     CARDIAC SURGERY     DENTAL SURGERY  2020   lipoma removal  2019   LUMBAR PUNCTURE  05/20/2021   TONSILLECTOMY AND ADENOIDECTOMY  2010   Social History   Socioeconomic History   Marital status: Single    Spouse name: Not on file   Number of children: Not on file   Years of education: Not on file   Highest education level: Not on file  Occupational History   Not on file  Tobacco Use   Smoking status: Never   Smokeless tobacco: Never  Vaping Use   Vaping status: Never Used  Substance and Sexual Activity   Alcohol use: No    Alcohol/week: 0.0 standard drinks of alcohol   Drug use: No   Sexual activity: Never    Birth control/protection: Pill  Other Topics Concern   Not on file  Social History Narrative   Not on file   Social Determinants of Health   Financial Resource Strain: Low Risk  (03/06/2023)   Overall Financial Resource Strain (CARDIA)  Difficulty of Paying Living Expenses: Not hard at all  Food Insecurity: No Food Insecurity (03/06/2023)   Hunger Vital Sign    Worried About Running Out of Food in the Last Year: Never true    Ran Out of Food in the Last Year: Never true  Transportation Needs: No Transportation Needs (03/06/2023)   PRAPARE - Administrator, Civil Service (Medical): No    Lack of Transportation (Non-Medical): No  Physical Activity: Inactive (03/06/2023)   Exercise Vital Sign    Days of Exercise per Week: 0 days    Minutes of Exercise per Session: 0 min  Stress: No Stress Concern Present (03/06/2023)   [employer] of Occupational Health - Occupational Stress Questionnaire    Feeling of Stress : Only a little  Social Connections: Socially Isolated (03/06/2023)   Social Connection and Isolation Panel [NHANES]    Frequency of Communication with Friends and Family: Three times a week    Frequency  of Social Gatherings with Friends and Family: Once a week    Attends Religious Services: Never    Database administrator or Organizations: No    Attends Engineer, structural: Never    Marital Status: Never married   Family History  Problem Relation Age of Onset   Hypertension Mother    Hyperlipidemia Mother    Diabetes Mother    Depression Mother    Arthritis Mother    Allergic rhinitis Mother    Asthma Mother    Bipolar disorder Mother    Anxiety disorder Mother    Mental illness Mother    Miscarriages / [place] Mother    Thyroid disease Mother    Bipolar disorder Father    Drug abuse Father    Alcohol abuse Father    ADD / ADHD Father    Mental illness Father    Allergic rhinitis Brother    Asthma Brother    ADD / ADHD Brother    Learning disabilities Brother    Hypertension Maternal Grandmother    Hyperlipidemia Maternal Grandmother    Heart disease Maternal Grandmother    Heart attack Maternal Grandfather    Bipolar disorder Maternal Grandfather    Angioedema Neg Hx    Eczema Neg Hx    Immunodeficiency Neg Hx    Urticaria Neg Hx    Outpatient Encounter Medications as of 10/11/2023  Medication Sig   glycopyrrolate (ROBINUL) 1 MG tablet Take 1 mg by mouth 3 (three) times daily.   albuterol (PROVENTIL) (2.5 MG/3ML) 0.083% nebulizer solution Take 3 mLs (2.5 mg total) by nebulization every 6 (six) hours as needed for wheezing or shortness of breath.   ascorbic acid (VITAMIN C) 500 MG tablet Take 500 mg by mouth daily.   atomoxetine (STRATTERA) 60 MG capsule Take 1 capsule (60 mg total) by mouth daily.   Azelastine-Fluticasone 137-50 MCG/ACT SUSP Place 1 spray into both nostrils 2 (two) times daily.   bisoprolol (ZEBETA) 5 MG tablet Take 1.5 tablets (7.5 mg total) by mouth daily.   budesonide-formoterol (SYMBICORT) 160-4.5 MCG/ACT inhaler Inhale 1 puff into the lungs daily.   busPIRone (BUSPAR) 10 MG tablet Take 1 tablet (10 mg total) by mouth 2 (two) times  daily.   calcium carbonate (OS-CAL) 1250 (500 Ca) MG chewable tablet Chew by mouth.   cariprazine (VRAYLAR) 1.5 MG capsule Take 1 capsule (1.5 mg total) by mouth daily.   Cholecalciferol 25 MCG (1000 UT) tablet Take 1 Units by mouth daily at 2 PM.  clobetasol (TEMOVATE) 0.05 % external solution APPLY TO AFFECTED AREA TWICE A DAY   clonazePAM (KLONOPIN) 0.5 MG tablet Take 1 tablet (0.5 mg total) by mouth 2 (two) times daily as needed for anxiety.   dicyclomine (BENTYL) 10 MG capsule Take 10 mg by mouth as needed for spasms.   EPIPEN 2-PAK 0.3 MG/0.3ML SOAJ injection INJECT 0.3 MG INTO THE MUSCLE AS NEEDED FOR ANAPHYLAXIS.   fexofenadine (ALLEGRA) 180 MG tablet TAKE 1 TABLET BY MOUTH EVERY DAY   Galcanezumab-gnlm 120 MG/ML SOAJ Inject into the skin. Emgality   hydrOXYzine (VISTARIL) 25 MG capsule TAKE 1-2 CAPSULES (25-50 MG TOTAL) BY MOUTH DAILY AS NEEDED.   levothyroxine (SYNTHROID) 50 MCG tablet Take 1 tablet (50 mcg total) by mouth daily before breakfast.   LINZESS 72 MCG capsule Take 72 mcg by mouth daily.   Loratadine (CLARITIN PO) Take by mouth.   metoprolol tartrate (LOPRESSOR) 25 MG tablet Take 1 tablet (25 mg total) by mouth as needed.   montelukast (SINGULAIR) 10 MG tablet Take 1 tablet (10 mg total) by mouth daily.   Multiple Vitamin (MULTIVITAMIN) capsule Take by mouth.   norethindrone (MICRONOR) 0.35 MG tablet Take 1 tablet (0.35 mg total) by mouth daily.   Olopatadine HCl 0.2 % SOLN Apply 1 drop to eye every morning.   ondansetron (ZOFRAN-ODT) 4 MG disintegrating tablet DISSOLVE ON TONGUE AT ONSET OF NAUSEA- 30 DAY SUPPLY   pantoprazole (PROTONIX) 40 MG tablet Take 1 tablet by mouth in the morning and at bedtime.   RESTASIS 0.05 % ophthalmic emulsion 1 drop 2 (two) times daily.   sertraline (ZOLOFT) 50 MG tablet Take 1.5 tablets (75 mg total) by mouth daily.   SUMAtriptan (IMITREX) 100 MG tablet Take by mouth as needed.   topiramate (TOPAMAX) 50 MG tablet Take 50 mg by mouth 2  (two) times daily.   VENTOLIN HFA 108 (90 Base) MCG/ACT inhaler INHALE 2 PUFFS BY MOUTH EVERY 4 HOURS AS NEEDED FOR WHEEZE OR FOR SHORTNESS OF BREATH   [DISCONTINUED] levothyroxine (SYNTHROID) 25 MCG tablet Take 1 tablet (25 mcg total) by mouth daily.   No facility-administered encounter medications on file as of 10/11/2023.   ALLERGIES: Allergies  Allergen Reactions   Glucosamine Forte [Nutritional Supplements] Anaphylaxis    Due to containing shellfish   Nutritional Supplements Anaphylaxis   Oyster Extract Anaphylaxis   Shellfish-Derived Products Anaphylaxis    Throat swelling   Cats Claw (Uncaria Tomentosa) Nausea And Vomiting   Diamox [Acetazolamide] Other (See Comments)    Numbness- feet, hands, lips   Lactose    Lactose Intolerance (Gi)    Topiramate Other (See Comments)    Numbness to face, hands and feet   Bromfed Dm [Pseudoeph-Bromphen-Dm] Anxiety    CARBOFED DM    VACCINATION STATUS: Immunization History  Administered Date(s) Administered   DTaP 09/24/2003, 12/10/2003, 02/16/2004, 11/01/2004, 07/23/2007, 07/09/2012   Dtap, Unspecified 11/01/2004, 07/23/2007   H1N1 10/07/2008, 11/12/2008   HIB (PRP-OMP) 09/24/2003, 12/10/2003, 02/16/2004, 11/01/2004, 07/23/2017   HIB (PRP-T) 09/24/2003, 12/10/2003, 02/16/2004   HIB, Unspecified 11/01/2004   HPV Quadrivalent 09/04/2013, 11/05/2013, 03/20/2014   Hep A, Unspecified 07/24/2006, 07/23/2007   Hep B, Unspecified 09/03/2003, 12/10/2003, 07/26/2004   IPV 09/24/2003, 12/10/2003, 11/01/2004, 07/23/2007   Influenza Nasal 08/10/2010, 09/03/2012, 09/04/2013, 09/14/2014   Influenza Split 09/05/2011, 09/05/2021   Influenza, Seasonal, Injecte, Preservative Fre 10/12/2005, 10/07/2008, 09/27/2009   Influenza,Quad,Nasal, Live 09/03/2012, 09/04/2013, 09/14/2014   Influenza,inj,Quad PF,6+ Mos 08/30/2015, 09/21/2016, 09/13/2017, 09/06/2018, 08/27/2019, 11/12/2020, 09/05/2021, 10/11/2022   Influenza,inj,quad,  With Preservative 10/12/2018    Influenza-Unspecified 09/05/2011, 09/05/2011   MMR 07/26/2004, 07/23/2007   Meningococcal B, OMV 10/21/2019, 05/13/2020   Meningococcal Mcv4o 09/14/2014, 10/21/2019   PFIZER Comirnaty(Gray Top)Covid-19 Tri-Sucrose Vaccine 04/01/2021, 05/06/2021   PPD Test 08/29/2023   Pfizer(Comirnaty)Fall Seasonal Vaccine 12 years and older 11/17/2022, 10/05/2023   Pneumococcal Conjugate PCV 7 09/24/2003, 12/10/2003, 02/16/2004, 11/01/2004   Pneumococcal Polysaccharide-23 07/09/2012, 03/06/2022   Pneumococcal-Unspecified 09/24/2003, 02/16/2004, 11/01/2004, 12/09/2006   Polio, Unspecified 11/01/2004, 07/23/2007   Tdap 09/14/2014   Varicella 07/26/2004, 07/23/2007    HPI Molly [name] is 22 y.o. female who presents today with a medical history as above. she is being seen in follow-up after she was seen in consultation for hyperhidrosis requested by Sonny Masters, FNP.    She is accompanied by her mother to clinic.  She denies any prior history of adrenal, thyroid nor hypothalamic dysfunction.  Her previsit labs did not indicate a possibility of pheochromocytoma.   She does not have new complaints today.  She was started on treatment for hypothyroidism which she continues to tolerate.  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    She underwent several workup including 24-hour urine metanephrines measurements as well as adrenal CT scan which did not show any particular pathology.  Of note patient is on polypharmacy for multiple medical problems including mood disorders, bipolar disorders, anxiety disorders. Patient has been overweight/obese for most of her life including during her pediatric ages. She presents with 6 pounds of weight loss, at a height of 4'11'' giving her a BMI of 45.16.    She has prediabetes, PCOS, hyperlipidemia, hypertension, besides her mental illnesses.  Her point-of-care A1c today is 5.1% indicating reversal of her prediabetes. She is 22  years old with 30+ medications on her list. Her active medications include Strattera, azelastine-fluticasone, bisoprolol fumarate, Symbicort, buspirone, cariprazine, Klonopin, Valium, Benadryl, Allegra, Vistaril, Singulair, Zoloft, Imitrex, Topamax.  There is significant family history of obesity, type 2 diabetes, hypertension, hyperlipidemia, asthma, thyroid dysfunction, multiple allergy conditions among others. She gives history of being suicidal in 2019, however the medications she is taking currently helped her regain her insight.  She is not suicidal anymore.  Patient is not following any particular diet or exercise program.  She has not engaged optimally to the lifestyle medicine we discussed last time. 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 She denies any exposure to heavy dose steroids, except in her bronchodilators.   Review of Systems  Constitutional: + Progressive weight gain,+ fatigue, no subjective hyperthermia, no subjective hypothermia Eyes: no blurry vision, no xerophthalmia   Objective:       10/11/2023    1:56 PM 09/26/2023    3:05 PM 09/14/2023   11:21 AM  Vitals with BMI  Height 4\' 11"   4\' 11"   Weight 223 lbs 10 oz 227 lbs 227 lbs  BMI 45.14 45.82 45.82  Systolic 108 129 161  Diastolic 62 89 70  Pulse 64 101 99    BP 108/62   Pulse 64   Ht 4\' 11"  (1.499 m)   Wt 223 lb 9.6 oz (101.4 kg)   LMP 09/13/2023 (Exact Date) Comment: two days long  BMI 45.16 kg/m   Wt Readings from Last 3 Encounters:  10/11/23 223 lb 9.6 oz (101.4 kg)  09/26/23 227 lb (103 kg)  09/14/23 227 lb (103 kg)    Physical Exam  Constitutional:  Body mass index is 45.16 kg/m.,  not in acute  distress, normal state of mind Eyes: PERRLA, EOMI, no exophthalmos ENT: moist mucous membranes, no gross thyromegaly, no gross cervical lymphadenopathy   CMP ( most recent) CMP     Component Value Date/Time   NA 144 05/18/2023 1208   K 3.7 05/18/2023 1208   CL 105  05/18/2023 1208   CO2 23 05/18/2023 1208   GLUCOSE 99 05/18/2023 1208   GLUCOSE 103 (H) 04/05/2018 1818   BUN 8 05/18/2023 1208   CREATININE 0.78 05/18/2023 1208   CALCIUM 10.0 05/18/2023 1208   PROT 6.6 05/18/2023 1208   ALBUMIN 4.6 05/18/2023 1208   AST 26 05/18/2023 1208   ALT 40 (H) 05/18/2023 1208   ALKPHOS 96 05/18/2023 1208   BILITOT 0.4 05/18/2023 1208   EGFR 112 05/18/2023 1208   GFRNONAA NOT CALCULATED 04/05/2018 1818     Diabetic Labs (most recent): Lab Results  Component Value Date   HGBA1C 5.1 10/11/2023   HGBA1C 5.0 05/18/2023   HGBA1C 5.8 (H) 10/11/2022   MICROALBUR 150 06/20/2023     Lipid Panel ( most recent) Lipid Panel     Component Value Date/Time   CHOL 181 (H) 05/29/2023 1030   TRIG 134 (H) 05/29/2023 1030   HDL 44 05/29/2023 1030   CHOLHDL 4.1 05/29/2023 1030   CHOLHDL 3.3 03/07/2018 0713   VLDL 20 03/07/2018 0713   LDLCALC 113 (H) 05/29/2023 1030   LABVLDL 24 05/29/2023 1030      Lab Results  Component Value Date   TSH 3.730 10/05/2023   TSH 4.790 (H) 05/29/2023   TSH 3.270 05/18/2023   TSH 1.550 03/06/2022   TSH 2.434 03/07/2018   FREET4 1.12 10/05/2023   FREET4 1.07 05/29/2023   FREET4 1.10 03/06/2022     Recent Results (from the past 2160 hour(s))  COVID-19, Flu A+B and RSV     Status: None   Collection Time: 08/01/23  3:20 PM   Specimen: Nasopharyngeal(NP) swabs in vial transport medium  Result Value Ref Range   SARS-CoV-2, NAA Not Detected Not Detected   Influenza A, NAA Not Detected Not Detected   Influenza B, NAA Not Detected Not Detected   RSV, NAA Not Detected Not Detected   Test Information: Comment     Comment: This nucleic acid amplification test was developed and its performance characteristics determined by [employer]. Nucleic acid amplification tests include RT-PCR and TMA. This test has not been FDA cleared or approved. This test has been authorized by FDA under an Emergency Use Authorization (EUA).  This test is only authorized for the duration of time the declaration that circumstances exist justifying the authorization of the emergency use of in vitro diagnostic tests for detection of SARS-CoV-2 virus and/or diagnosis of COVID-19 infection under section 564(b)(1) of the Act, 21 U.S.C. 213YQM-5(H) (1), unless the authorization is terminated or revoked sooner. When diagnostic testing is negative, the possibility of a false negative result should be considered in the context of a patient's recent exposures and the presence of clinical signs and symptoms consistent with COVID-19. An individual without symptoms of COVID-19 and who is not shedding SARS-CoV-2 virus wo uld expect to have a negative (not detected) result in this assay.   TB Skin Test     Status: Normal   Collection Time: 08/31/23 11:45 AM  Result Value Ref Range   TB Skin Test Negative    Induration 0 mm  Metanephrines, plasma     Status: None   Collection Time: 10/05/23 11:00 AM  Result  Value Ref Range   Normetanephrine, Free 66.8 0.0 - 210.1 pg/mL   Metanephrine, Free <25.0 0.0 - 88.0 pg/mL  TSH     Status: None   Collection Time: 10/05/23 11:00 AM  Result Value Ref Range   TSH 3.730 0.450 - 4.500 uIU/mL  T4, free     Status: None   Collection Time: 10/05/23 11:00 AM  Result Value Ref Range   Free T4 1.12 0.82 - 1.77 ng/dL  HgB B1Y     Status: None   Collection Time: 10/11/23  2:19 PM  Result Value Ref Range   Hemoglobin A1C 5.1 4.0 - 5.6 %   HbA1c POC (<> result, manual entry)     HbA1c, POC (prediabetic range)     HbA1c, POC (controlled diabetic range)       Assessment & Plan:   1. Hyperhidrosis 2. Prediabetes 3.  Morbid obesity 4.  Hypertension 5.  Hyperlipidemia 6.  Hypothyroidism I reviewed her new and available records with her. -She seems to be benefiting from low-dose levothyroxine.  She would benefit from slight increase.  I discussed and increase her levothyroxine to 50 mcg p.o. daily  before breakfast.     - We discussed about the correct intake of her thyroid hormone, on empty stomach at fasting, with water, separated by at least 30 minutes from breakfast and other medications,  and separated by more than 4 hours from calcium, iron, multivitamins, acid reflux medications (PPIs). -Patient is made aware of the fact that thyroid hormone replacement is needed for life, dose to be adjusted by periodic monitoring of thyroid function tests.  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 -She was previously worked up and ruled out for pheochromocytoma.  Her 5 HIAA was never done.  Previsit free plasma metanephrines are normal.    -Her workup rules out Cushing's syndrome. She is vitamin D replete. -She will be put on expectant management.  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  She has at least 8 medications with known side effects of hyperhidrosis including Strattera, azelastine-fluticasone, buspirone, cariprazine, Klonopin, Valium, Zoloft, Imitrex. -It seems like she is benefiting from this medications.  Unfortunately patient is on serious polypharmacy with 30+ medications at age 76.  Her medication list deserves to be revised and revisited by respective prescribers.  I advised her to address these concerns  during her next visits with her psychiatrist as well as primary care provider.  In light of her metabolic dysfunction with morbid obesity, PCOS, hyperlipidemia, hypertension, prediabetes, mental illness, this patient remains to be a good candidate for lifestyle medicine.   -She does not have ideal family situation to engage optimally, however she is trying.   - she acknowledges that there is a room for improvement in her food and drink choices. - Suggestion is made for her to avoid simple carbohydrates  from her diet including Cakes, Sweet  Desserts, Ice Cream, Soda (diet and regular), Sweet Tea, Candies, Chips, Cookies, Store Bought Juices, Alcohol , Artificial Sweeteners,  Coffee Creamer, and "Sugar-free" Products, Lemonade. This will help patient to have more stable blood glucose profile and potentially avoid unintended weight gain.  The following Lifestyle Medicine recommendations according to American College of Lifestyle Medicine  [facility]) were discussed and and offered to patient and she  agrees to start the journey:  A. Whole Foods, Plant-Based Nutrition comprising of fruits and vegetables, plant-based proteins,  whole-grain carbohydrates was discussed in detail with the patient.   A list for source of those nutrients were also provided to the patient.  Patient will use only water or unsweetened tea for hydration. B.  The need to stay away from risky substances including alcohol, smoking; obtaining 7 to 9 hours of restorative sleep, at least 150 minutes of moderate intensity exercise weekly, the importance of healthy social connections,  and stress management techniques were discussed. C.  A full color page of  Calorie density of various food groups per pound showing examples of each food groups was provided to the patient.    Even with partial engagement, this patient can drow significant benefit potentially coming off of most of her medications.   If her  labs are suggestive of any endocrine dysfunction, she will be considered for specific treatment accordingly.    - she is advised to maintain close follow up with Rakes, Doralee Albino, FNP for primary care needs.   I spent  26  minutes in the care of the patient today including review of labs from Thyroid Function, CMP, and other relevant labs ; imaging/biopsy records (current and previous including abstractions from other facilities); face-to-face time discussing  her lab results and symptoms, medications doses, her options of short and long term treatment based on the latest  standards of care / guidelines;   and documenting the encounter.  Molly [name]  participated in the discussions, expressed understanding, and voiced agreement with the above plans.  All questions were answered to her satisfaction. she is encouraged to contact clinic should she have any questions or concerns prior to her return visit.   Follow up plan: Return in about 3 months (around 01/11/2024) for Fasting Labs  in AM B4 8.   Marquis Lunch, MD [facility] [address] [place], [place] [address] Phone: [phone]  Fax: [phone]     10/11/2023, 7:02 PM  This note was partially dictated with voice recognition software. Similar sounding words can be transcribed inadequately or may not  be corrected upon review.

## 2023-10-11 NOTE — Patient Instructions (Signed)

## 2023-10-29 ENCOUNTER — Ambulatory Visit: Payer: MEDICAID | Admitting: Family Medicine

## 2023-10-29 ENCOUNTER — Encounter: Payer: Self-pay | Admitting: Family Medicine

## 2023-10-29 VITALS — BP 131/90 | HR 113 | Temp 97.4°F | Ht 59.0 in | Wt 222.0 lb

## 2023-10-29 DIAGNOSIS — J069 Acute upper respiratory infection, unspecified: Secondary | ICD-10-CM | POA: Diagnosis not present

## 2023-10-29 LAB — RAPID STREP SCREEN (MED CTR MEBANE ONLY): Strep Gp A Ag, IA W/Reflex: NEGATIVE

## 2023-10-29 LAB — VERITOR FLU A/B WAIVED
Influenza A: NEGATIVE
Influenza B: NEGATIVE

## 2023-10-29 LAB — CULTURE, GROUP A STREP

## 2023-10-29 MED ORDER — AMOXICILLIN 500 MG PO CAPS: 500.0000 mg | ORAL_CAPSULE | Freq: Two times a day (BID) | ORAL | 0 refills | Status: AC

## 2023-10-29 NOTE — Progress Notes (Signed)
BP (!) 131/90   Pulse (!) 113   Temp (!) 97.4 F (36.3 C)   Ht 4\' 11"  (1.499 m)   Wt 222 lb (100.7 kg)   SpO2 100%   BMI 44.84 kg/m    Subjective:   Patient ID: [name], female    DOB: [birth year], 22 y.o.   MRN: [id number]  HPI: Molly [name] is a 22 y.o. female presenting on 10/29/2023 for URI, Sore Throat, and sneezing  Patient is here today for cough, congestion, sore throat, sneezing, myalgias, and chills. Patient first started having a mild cough, congestion, and sneezing about 1.5 weeks ago. She then developed a sore throat, myalgias, subjective fever, and chills over this past weekend. She went to urgent care on Friday, where she was prescribed a course of steroids. She has not really noticed any improvements since taking the steroids though her cough has mostly resolved. She feels like the congestion continues to worsen, and she is now having sinus pressure with yellowish-green drainage. She is unaware of any sick contacts, but she works in a day care. She is taking Tylenol for myalgias and fevers.  Relevant past medical, surgical, family and social history reviewed and updated as indicated. Interim medical history since our last visit reviewed. Allergies and medications reviewed and updated.  Review of Systems  Constitutional:  Positive for chills and fever.  HENT:  Positive for congestion, sinus pressure, sneezing and sore throat. Negative for ear pain.   Respiratory:  Positive for cough. Negative for chest tightness, shortness of breath and wheezing.   Cardiovascular:  Negative for chest pain and palpitations.  Gastrointestinal:  Negative for abdominal pain.  Genitourinary:  Negative for difficulty urinating.  Musculoskeletal:  Positive for myalgias.  Allergic/Immunologic: Positive for environmental allergies.  Neurological:  Negative for weakness and headaches.    Per HPI unless specifically indicated above   Allergies as of 10/29/2023        Reactions   Glucosamine Forte [nutritional Supplements] Anaphylaxis   Due to containing shellfish   Nutritional Supplements Anaphylaxis   Oyster Extract Anaphylaxis   Shellfish-derived Products Anaphylaxis   Throat swelling   Cats Claw (uncaria Tomentosa) Nausea And Vomiting   Diamox [acetazolamide] Other (See Comments)   Numbness- feet, hands, lips   Lactose    Lactose Intolerance (gi)    Topiramate Other (See Comments)   Numbness to face, hands and feet   Bromfed Dm [pseudoeph-bromphen-dm] Anxiety   CARBOFED DM        Medication List        Accurate as of October 29, 2023  3:19 PM. If you have any questions, ask your nurse or doctor.          albuterol (2.5 MG/3ML) 0.083% nebulizer solution Commonly known as: PROVENTIL Take 3 mLs (2.5 mg total) by nebulization every 6 (six) hours as needed for wheezing or shortness of breath.   Ventolin HFA 108 (90 Base) MCG/ACT inhaler Generic drug: albuterol INHALE 2 PUFFS BY MOUTH EVERY 4 HOURS AS NEEDED FOR WHEEZE OR FOR SHORTNESS OF BREATH   ascorbic acid 500 MG tablet Commonly known as: VITAMIN C Take 500 mg by mouth daily.   atomoxetine 60 MG capsule Commonly known as: STRATTERA Take 1 capsule (60 mg total) by mouth daily.   Azelastine-Fluticasone 137-50 MCG/ACT Susp Place 1 spray into both nostrils 2 (two) times daily.   bisoprolol 5 MG tablet Commonly known as: ZEBETA Take 1.5 tablets (7.5 mg total) by mouth daily.  budesonide-formoterol 160-4.5 MCG/ACT inhaler Commonly known as: Symbicort Inhale 1 puff into the lungs daily.   busPIRone 10 MG tablet Commonly known as: BUSPAR Take 1 tablet (10 mg total) by mouth 2 (two) times daily.   calcium carbonate 1250 (500 Ca) MG chewable tablet Commonly known as: OS-CAL Chew by mouth.   cariprazine 1.5 MG capsule Commonly known as: Vraylar Take 1 capsule (1.5 mg total) by mouth daily.   Cholecalciferol 25 MCG (1000 UT) tablet Take 1 Units by mouth daily at 2  PM.   CLARITIN PO Take by mouth.   clobetasol 0.05 % external solution Commonly known as: TEMOVATE APPLY TO AFFECTED AREA TWICE A DAY   clonazePAM 0.5 MG tablet Commonly known as: KlonoPIN Take 1 tablet (0.5 mg total) by mouth 2 (two) times daily as needed for anxiety.   dicyclomine 10 MG capsule Commonly known as: BENTYL Take 10 mg by mouth as needed for spasms.   EpiPen 2-Pak 0.3 MG/0.3ML Soaj injection Generic drug: EPINEPHrine INJECT 0.3 MG INTO THE MUSCLE AS NEEDED FOR ANAPHYLAXIS.   fexofenadine 180 MG tablet Commonly known as: ALLEGRA TAKE 1 TABLET BY MOUTH EVERY DAY   Galcanezumab-gnlm 120 MG/ML Soaj Inject into the skin. Emgality   glycopyrrolate 1 MG tablet Commonly known as: ROBINUL Take 1 mg by mouth 3 (three) times daily.   hydrOXYzine 25 MG capsule Commonly known as: VISTARIL TAKE 1-2 CAPSULES (25-50 MG TOTAL) BY MOUTH DAILY AS NEEDED.   levothyroxine 50 MCG tablet Commonly known as: SYNTHROID Take 1 tablet (50 mcg total) by mouth daily before breakfast.   Linzess 72 MCG capsule Generic drug: linaclotide Take 72 mcg by mouth daily.   metoprolol tartrate 25 MG tablet Commonly known as: LOPRESSOR Take 1 tablet (25 mg total) by mouth as needed.   montelukast 10 MG tablet Commonly known as: SINGULAIR Take 1 tablet (10 mg total) by mouth daily.   multivitamin capsule Take by mouth.   norethindrone 0.35 MG tablet Commonly known as: MICRONOR Take 1 tablet (0.35 mg total) by mouth daily.   Olopatadine HCl 0.2 % Soln Apply 1 drop to eye every morning.   ondansetron 4 MG disintegrating tablet Commonly known as: ZOFRAN-ODT DISSOLVE ON TONGUE AT ONSET OF NAUSEA- 30 DAY SUPPLY   pantoprazole 40 MG tablet Commonly known as: PROTONIX Take 1 tablet by mouth in the morning and at bedtime.   Restasis 0.05 % ophthalmic emulsion Generic drug: cycloSPORINE 1 drop 2 (two) times daily.   sertraline 50 MG tablet Commonly known as: ZOLOFT Take 1.5  tablets (75 mg total) by mouth daily.   SUMAtriptan 100 MG tablet Commonly known as: IMITREX Take by mouth as needed.   topiramate 50 MG tablet Commonly known as: TOPAMAX Take 50 mg by mouth 2 (two) times daily.         Objective:   BP (!) 131/90   Pulse (!) 113   Temp (!) 97.4 F (36.3 C)   Ht 4\' 11"  (1.499 m)   Wt 222 lb (100.7 kg)   SpO2 100%   BMI 44.84 kg/m   Wt Readings from Last 3 Encounters:  10/29/23 222 lb (100.7 kg)  10/11/23 223 lb 9.6 oz (101.4 kg)  09/26/23 227 lb (103 kg)    Physical Exam Vitals and nursing note reviewed.  Constitutional:      General: She is awake.     Appearance: Normal appearance. She is obese.  HENT:     Head: Normocephalic and atraumatic.  Right Ear: Tympanic membrane, ear canal and external ear normal.     Left Ear: Tympanic membrane, ear canal and external ear normal.     Mouth/Throat:     Mouth: Mucous membranes are moist.     Pharynx: Oropharynx is clear. Posterior oropharyngeal erythema present.  Eyes:     Conjunctiva/sclera: Conjunctivae normal.  Cardiovascular:     Rate and Rhythm: Regular rhythm. Tachycardia present.     Heart sounds: Normal heart sounds.  Pulmonary:     Effort: Pulmonary effort is normal. No respiratory distress.     Breath sounds: Normal breath sounds. No wheezing or rales.  Musculoskeletal:     Cervical back: Normal range of motion and neck supple.  Skin:    General: Skin is warm.     Comments: Sweat noticeable on forehead  Neurological:     Mental Status: She is alert and oriented to person, place, and time.  Psychiatric:        Mood and Affect: Mood normal.        Behavior: Behavior normal. Behavior is cooperative.        Thought Content: Thought content normal.        Judgment: Judgment normal.    Assessment & Plan:   Problem List Items Addressed This Visit   None Visit Diagnoses     Upper respiratory tract infection, unspecified type    -  Primary   Relevant Orders    Veritor Flu A/B Waived (Completed)   Rapid Strep Screen ([facility] ONLY) (Completed)   Novel Coronavirus, NAA ([facility]) (Completed)       Patient tested negative for influenza A, influenza B, and strep throat. COVID swab collected and will result in a few days. Since patient has been having symptoms for 11 days, will prescribe amoxicillin 500 mg BID for 10 days. Recommended patient also take benadryl and Mucinex to help with symptoms.  Follow up plan: Return if symptoms worsen or fail to improve.  Counseling provided for all of the vaccine components Orders Placed This Encounter  Procedures   Rapid Strep Screen ([facility] ONLY)   Novel Coronavirus, NAA ([facility])   Veritor Flu A/B Waived    Gillermina Phy, Medical Student [facility] 10/29/2023, 3:19 PM  Patient seen and examined with Gillermina Phy, medical student.  Agree with assessment plan above.  Flu and strep are negative.  COVID is pending.  Will treat like sinus infection because of duration worsening and given amoxicillin twice daily. Arville Care, MD [facility] 11/07/2023, 12:35 PM

## 2023-10-30 ENCOUNTER — Encounter: Payer: Self-pay | Admitting: Family Medicine

## 2023-10-30 LAB — NOVEL CORONAVIRUS, NAA: SARS-CoV-2, NAA: NOT DETECTED

## 2023-10-30 MED ORDER — FLUCONAZOLE 150 MG PO TABS: 150.0000 mg | ORAL_TABLET | Freq: Once | ORAL | 0 refills | Status: AC

## 2023-11-03 DIAGNOSIS — J4550 Severe persistent asthma, uncomplicated: Secondary | ICD-10-CM | POA: Diagnosis not present

## 2023-11-05 ENCOUNTER — Encounter: Payer: Self-pay | Admitting: Internal Medicine

## 2023-11-06 ENCOUNTER — Encounter (HOSPITAL_BASED_OUTPATIENT_CLINIC_OR_DEPARTMENT_OTHER): Payer: Self-pay

## 2023-11-07 ENCOUNTER — Other Ambulatory Visit (HOSPITAL_COMMUNITY): Payer: Self-pay

## 2023-11-07 ENCOUNTER — Ambulatory Visit (HOSPITAL_BASED_OUTPATIENT_CLINIC_OR_DEPARTMENT_OTHER): Payer: MEDICAID | Admitting: Cardiovascular Disease

## 2023-11-07 NOTE — Telephone Encounter (Signed)
Patient needed PA for medication. Thank you!

## 2023-11-09 ENCOUNTER — Other Ambulatory Visit (HOSPITAL_COMMUNITY): Payer: Self-pay

## 2023-11-09 ENCOUNTER — Telehealth: Payer: Self-pay | Admitting: Pharmacy Technician

## 2023-11-09 ENCOUNTER — Telehealth (HOSPITAL_BASED_OUTPATIENT_CLINIC_OR_DEPARTMENT_OTHER): Payer: Self-pay | Admitting: Family

## 2023-11-09 NOTE — Telephone Encounter (Signed)
Pharmacy Patient Advocate Encounter   Received notification from Patient Advice Request messages that prior authorization for bisoprolol is required/requested.   Insurance verification completed.   The patient is insured through [employer] .   Per test claim: PA required; PA submitted to above mentioned insurance via CoverMyMeds Key/confirmation #/EOC [id number] Status is pending

## 2023-11-09 NOTE — Telephone Encounter (Signed)
Spoke to patient's mother regarding PA. Mother stated her daughter only has 2 days left of medication. Informed I will call her back as soon as PA is resulted. She verbally understands.

## 2023-11-09 NOTE — Telephone Encounter (Signed)
Copied from CRM [id number]. Topic: Clinical - Medical Advice >> Nov 09, 2023 12:59 PM Danika B wrote: Reason for CRM: Patients mother [name] called in stating [name] is still having symptoms after being seen and diagnosed with upper res infection and antibiotics prescribed, as well as continuing body aches. No appts available today. Mother would like advice on next steps. Callback [phone]

## 2023-11-09 NOTE — Telephone Encounter (Signed)
Follow Up:      Mother is calling, she need to talk to Chantel please.

## 2023-11-10 NOTE — Telephone Encounter (Signed)
Pharmacy Patient Advocate Encounter  Received notification from [facility] that Prior Authorization for bisoprolol has been APPROVED from 11/10/23 to 11/09/24   PA #/Case ID/Reference #: [id number]

## 2023-11-12 ENCOUNTER — Telehealth (HOSPITAL_BASED_OUTPATIENT_CLINIC_OR_DEPARTMENT_OTHER): Payer: Self-pay

## 2023-11-12 ENCOUNTER — Other Ambulatory Visit: Payer: Self-pay | Admitting: Family Medicine

## 2023-11-12 MED ORDER — LEVOFLOXACIN 500 MG PO TABS: 500.0000 mg | ORAL_TABLET | Freq: Every day | ORAL | 0 refills | Status: DC

## 2023-11-12 NOTE — Telephone Encounter (Signed)
Please let the patient know that I sent their prescription to their pharmacy. Thanks, WS 

## 2023-11-12 NOTE — Telephone Encounter (Signed)
Called patient's mother in regards of prior auth of bisoprolol. She verbalized understanding.

## 2023-11-12 NOTE — Telephone Encounter (Signed)
Mother aware

## 2023-11-13 ENCOUNTER — Encounter (HOSPITAL_COMMUNITY): Payer: Self-pay | Admitting: Psychiatry

## 2023-11-13 ENCOUNTER — Telehealth (HOSPITAL_COMMUNITY): Payer: MEDICAID | Admitting: Psychiatry

## 2023-11-13 DIAGNOSIS — F902 Attention-deficit hyperactivity disorder, combined type: Secondary | ICD-10-CM

## 2023-11-13 DIAGNOSIS — F333 Major depressive disorder, recurrent, severe with psychotic symptoms: Secondary | ICD-10-CM | POA: Diagnosis not present

## 2023-11-13 MED ORDER — CARIPRAZINE HCL 1.5 MG PO CAPS: 1.5000 mg | ORAL_CAPSULE | Freq: Every day | ORAL | 2 refills | Status: DC

## 2023-11-13 MED ORDER — BUSPIRONE HCL 10 MG PO TABS: 10.0000 mg | ORAL_TABLET | Freq: Two times a day (BID) | ORAL | 2 refills | Status: DC

## 2023-11-13 MED ORDER — ATOMOXETINE HCL 60 MG PO CAPS: 60.0000 mg | ORAL_CAPSULE | Freq: Every day | ORAL | 1 refills | Status: DC

## 2023-11-13 MED ORDER — SERTRALINE HCL 50 MG PO TABS: 75.0000 mg | ORAL_TABLET | Freq: Every day | ORAL | 1 refills | Status: DC

## 2023-11-13 NOTE — Progress Notes (Signed)
Virtual Visit via Video Note  I connected with Molly [name] on 11/13/23 at 11:20 AM EST by a video enabled telemedicine application and verified that I am speaking with the correct person using two identifiers.  Location: Patient: home Provider: office   I discussed the limitations of evaluation and management by telemedicine and the availability of in person appointments. The patient expressed understanding and agreed to proceed.     I discussed the assessment and treatment plan with the patient. The patient was provided an opportunity to ask questions and all were answered. The patient agreed with the plan and demonstrated an understanding of the instructions.   The patient was advised to call back or seek an in-person evaluation if the symptoms worsen or if the condition fails to improve as anticipated.  I provided 20 minutes of non-face-to-face time during this encounter.   [name], MD  [facility] MD/PA/NP OP Progress Note  11/13/2023 11:41 AM Molly [name]  MRN:  [id number]  Chief Complaint:  Chief Complaint  Patient presents with   Anxiety   Depression   Follow-up   ADD   HPI: This patient is a 22 year old female lives with her mother mother's fianc and a younger brother in [place].  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.  The patient returns for follow-up after 2 months.  She states overall she is doing very well.  She really enjoys her new job.  She still has trouble with POTS but is drinking plenty of water and it has been minimal in terms of symptoms.  She is also on metoprolol which helps regulate her heart rate.  She is not sure if some of the episode she had in the past are related to POTS or anxiety but she is not using the hydroxyzine or clonazepam as much as she had in the past.  She continues to lose weight and is down to 222 pounds.  The patient states that her mood has been good  and she denies significant anxiety.  She is sleeping well.  She is very talkative and bubbly.  She denies significant mood swings with the Vraylar.  Her focus is generally good particularly at work Visit Diagnosis:    ICD-10-CM   1. Attention deficit hyperactivity disorder (ADHD), combined type  F90.2     2. Severe episode of recurrent major depressive disorder, with psychotic features (HCC)  F33.3       Past Psychiatric History: Past psychiatric admissions at a younger age, 2 years of therapy [facility]  Past Medical History:  Past Medical History:  Diagnosis Date   Acid reflux 2010   Acne vulgaris 2017   ADHD (attention deficit hyperactivity disorder) 2011   Adverse food reaction 07/11/2016   Shellfish allergy   Amenorrhea 2019   Anaphylactic shock due to adverse food reaction 01/15/2018   Anxiety 2017   ASD (atrial septal defect) 11/22/2022   Chronic constipation 09/05/2011   Chronic tension headaches 2011   originally followed by [facility] K.Griffin. Then Dr Alexis [facility] 03/2020 (see above)    Coalition, calcaneus navicular    Dysmenorrhea    Eczema    Eustachian tube dysfunction, left 10/23/2019   Frequent headaches    Galactorrhea    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 04/01/2019   Hiatal hernia    History of atrial septal defect repair 12/04/2011   Hyperlipidemia 2013   Hypertension 2011  Hypertension 09/28/2020   hypertension, cellulitus    Idiopathic urticaria 09/05/2011   Insomnia    Keratosis pilaris 01/15/2018   Mass of left thigh 11/22/2017   Overview:  Added automatically from request for surgery [id number]   MDD (major depressive disorder), severe (HCC) 03/06/2018   Migraine 2011   re-diagnosed as chronic migraines 03/2020 [facility] - starting on preventative    Obesity without serious comorbidity with body mass index (BMI) in 95th to 98th percentile for age in pediatric patient 09/04/2018   PCOS (polycystic ovarian syndrome)    Piriformis syndrome     POTS (postural orthostatic tachycardia syndrome)    PTSD (post-traumatic stress disorder)    Scoliosis 2019   Seasonal and perennial allergic rhinitis 2010   Severe persistent asthma without complication 2010   Sleep paralysis    Suicidal ideation 03/07/2018   Thyroid disease    underactive thyroid   Tic disorder 2019    Past Surgical History:  Procedure Laterality Date   ASD REPAIR  2012   with Helix, via Cardiac Catheterization   atrial septal defecgt     CARDIAC SURGERY     DENTAL SURGERY  2020   lipoma removal  2019   LUMBAR PUNCTURE  05/20/2021   TONSILLECTOMY AND ADENOIDECTOMY  2010    Family Psychiatric History: See below  Family History:  Family History  Problem Relation Age of Onset   Hypertension Mother    Hyperlipidemia Mother    Diabetes Mother    Depression Mother    Arthritis Mother    Allergic rhinitis Mother    Asthma Mother    Bipolar disorder Mother    Anxiety disorder Mother    Mental illness Mother    Miscarriages / [place] Mother    Thyroid disease Mother    Bipolar disorder Father    Drug abuse Father    Alcohol abuse Father    ADD / ADHD Father    Mental illness Father    Allergic rhinitis Brother    Asthma Brother    ADD / ADHD Brother    Learning disabilities Brother    Hypertension Maternal Grandmother    Hyperlipidemia Maternal Grandmother    Heart disease Maternal Grandmother    Heart attack Maternal Grandfather    Bipolar disorder Maternal Grandfather    Angioedema Neg Hx    Eczema Neg Hx    Immunodeficiency Neg Hx    Urticaria Neg Hx     Social History:  Social History   Socioeconomic History   Marital status: Single    Spouse name: Not on file   Number of children: Not on file   Years of education: Not on file   Highest education level: Not on file  Occupational History   Not on file  Tobacco Use   Smoking status: Never   Smokeless tobacco: Never  Vaping Use   Vaping status: Never Used  Substance and  Sexual Activity   Alcohol use: No    Alcohol/week: 0.0 standard drinks of alcohol   Drug use: No   Sexual activity: Never    Birth control/protection: Pill  Other Topics Concern   Not on file  Social History Narrative   Not on file   Social Determinants of Health   Financial Resource Strain: Low Risk  (03/06/2023)   Overall Financial Resource Strain (CARDIA)    Difficulty of Paying Living Expenses: Not hard at all  Food Insecurity: No Food Insecurity (03/06/2023)   Hunger Vital Sign  Worried About Programme researcher, broadcasting/film/video in the Last Year: Never true    Ran Out of Food in the Last Year: Never true  Transportation Needs: No Transportation Needs (03/06/2023)   PRAPARE - Administrator, Civil Service (Medical): No    Lack of Transportation (Non-Medical): No  Physical Activity: Inactive (03/06/2023)   Exercise Vital Sign    Days of Exercise per Week: 0 days    Minutes of Exercise per Session: 0 min  Stress: No Stress Concern Present (03/06/2023)   [employer] of Occupational Health - Occupational Stress Questionnaire    Feeling of Stress : Only a little  Social Connections: Socially Isolated (03/06/2023)   Social Connection and Isolation Panel [NHANES]    Frequency of Communication with Friends and Family: Three times a week    Frequency of Social Gatherings with Friends and Family: Once a week    Attends Religious Services: Never    Database administrator or Organizations: No    Attends Banker Meetings: Never    Marital Status: Never married    Allergies:  Allergies  Allergen Reactions   Glucosamine Forte [Nutritional Supplements] Anaphylaxis    Due to containing shellfish   Nutritional Supplements Anaphylaxis   Oyster Extract Anaphylaxis   Shellfish-Derived Products Anaphylaxis    Throat swelling   Cats Claw (Uncaria Tomentosa) Nausea And Vomiting   Diamox [Acetazolamide] Other (See Comments)    Numbness- feet, hands, lips   Lactose    Lactose  Intolerance (Gi)    Topiramate Other (See Comments)    Numbness to face, hands and feet   Bromfed Dm [Pseudoeph-Bromphen-Dm] Anxiety    CARBOFED DM    Metabolic Disorder Labs: Lab Results  Component Value Date   HGBA1C 5.1 10/11/2023   MPG 105.41 03/07/2018   Lab Results  Component Value Date   PROLACTIN 49.7 (H) 03/07/2018   Lab Results  Component Value Date   CHOL 181 (H) 05/29/2023   TRIG 134 (H) 05/29/2023   HDL 44 05/29/2023   CHOLHDL 4.1 05/29/2023   VLDL 20 03/07/2018   LDLCALC 113 (H) 05/29/2023   LDLCALC 108 05/18/2023   Lab Results  Component Value Date   TSH 3.730 10/05/2023   TSH 4.790 (H) 05/29/2023    Therapeutic Level Labs: No results found for: "LITHIUM" No results found for: "VALPROATE" No results found for: "CBMZ"  Current Medications: Current Outpatient Medications  Medication Sig Dispense Refill   albuterol (PROVENTIL) (2.5 MG/3ML) 0.083% nebulizer solution Take 3 mLs (2.5 mg total) by nebulization every 6 (six) hours as needed for wheezing or shortness of breath. 75 mL 1   ascorbic acid (VITAMIN C) 500 MG tablet Take 500 mg by mouth daily.     atomoxetine (STRATTERA) 60 MG capsule Take 1 capsule (60 mg total) by mouth daily. 90 capsule 1   Azelastine-Fluticasone 137-50 MCG/ACT SUSP Place 1 spray into both nostrils 2 (two) times daily. 23 g 5   bisoprolol (ZEBETA) 5 MG tablet Take 1.5 tablets (7.5 mg total) by mouth daily. 135 tablet 3   budesonide-formoterol (SYMBICORT) 160-4.5 MCG/ACT inhaler Inhale 1 puff into the lungs daily. 10.2 g 5   busPIRone (BUSPAR) 10 MG tablet Take 1 tablet (10 mg total) by mouth 2 (two) times daily. 180 tablet 2   calcium carbonate (OS-CAL) 1250 (500 Ca) MG chewable tablet Chew by mouth.     cariprazine (VRAYLAR) 1.5 MG capsule Take 1 capsule (1.5 mg total) by mouth daily. 30  capsule 2   Cholecalciferol 25 MCG (1000 UT) tablet Take 1 Units by mouth daily at 2 PM.     clobetasol (TEMOVATE) 0.05 % external solution  APPLY TO AFFECTED AREA TWICE A DAY 50 mL 3   clonazePAM (KLONOPIN) 0.5 MG tablet Take 1 tablet (0.5 mg total) by mouth 2 (two) times daily as needed for anxiety. 60 tablet 2   dicyclomine (BENTYL) 10 MG capsule Take 10 mg by mouth as needed for spasms.     EPIPEN 2-PAK 0.3 MG/0.3ML SOAJ injection INJECT 0.3 MG INTO THE MUSCLE AS NEEDED FOR ANAPHYLAXIS. 2 each 2   fexofenadine (ALLEGRA) 180 MG tablet TAKE 1 TABLET BY MOUTH EVERY DAY 90 tablet 2   Galcanezumab-gnlm 120 MG/ML SOAJ Inject into the skin. Emgality     glycopyrrolate (ROBINUL) 1 MG tablet Take 1 mg by mouth 3 (three) times daily.     hydrOXYzine (VISTARIL) 25 MG capsule TAKE 1-2 CAPSULES (25-50 MG TOTAL) BY MOUTH DAILY AS NEEDED. 180 capsule 1   levofloxacin (LEVAQUIN) 500 MG tablet Take 1 tablet (500 mg total) by mouth daily. 7 tablet 0   levothyroxine (SYNTHROID) 50 MCG tablet Take 1 tablet (50 mcg total) by mouth daily before breakfast. 90 tablet 1   LINZESS 72 MCG capsule Take 72 mcg by mouth daily.     Loratadine (CLARITIN PO) Take by mouth.     metoprolol tartrate (LOPRESSOR) 25 MG tablet Take 1 tablet (25 mg total) by mouth as needed. 15 tablet 1   montelukast (SINGULAIR) 10 MG tablet Take 1 tablet (10 mg total) by mouth daily. 90 tablet 1   Multiple Vitamin (MULTIVITAMIN) capsule Take by mouth.     norethindrone (MICRONOR) 0.35 MG tablet Take 1 tablet (0.35 mg total) by mouth daily. 28 tablet 11   Olopatadine HCl 0.2 % SOLN Apply 1 drop to eye every morning.     ondansetron (ZOFRAN-ODT) 4 MG disintegrating tablet DISSOLVE ON TONGUE AT ONSET OF NAUSEA- 30 DAY SUPPLY 10 tablet 1   pantoprazole (PROTONIX) 40 MG tablet Take 1 tablet by mouth in the morning and at bedtime.     RESTASIS 0.05 % ophthalmic emulsion 1 drop 2 (two) times daily.     sertraline (ZOLOFT) 50 MG tablet Take 1.5 tablets (75 mg total) by mouth daily. 135 tablet 1   SUMAtriptan (IMITREX) 100 MG tablet Take by mouth as needed.     topiramate (TOPAMAX) 50 MG  tablet Take 50 mg by mouth 2 (two) times daily.     VENTOLIN HFA 108 (90 Base) MCG/ACT inhaler INHALE 2 PUFFS BY MOUTH EVERY 4 HOURS AS NEEDED FOR WHEEZE OR FOR SHORTNESS OF BREATH 18 each 0   No current facility-administered medications for this visit.     Musculoskeletal: Strength & Muscle Tone: within normal limits Gait & Station: normal Patient leans: N/A  Psychiatric Specialty Exam: Review of Systems  All other systems reviewed and are negative.   There were no vitals taken for this visit.There is no height or weight on file to calculate BMI.  General Appearance: Casual and Fairly Groomed  Eye Contact:  Good  Speech:  Clear and Coherent  Volume:  Normal  Mood:  Euthymic  Affect:  Congruent  Thought Process:  Goal Directed  Orientation:  Full (Time, Place, and Person)  Thought Content: WDL   Suicidal Thoughts:  No  Homicidal Thoughts:  No  Memory:  Immediate;   Good Recent;   Good Remote;   NA  Judgement:  Good  Insight:  Fair  Psychomotor Activity:  Normal  Concentration:  Concentration: Good and Attention Span: Good  Recall:  Good  Fund of Knowledge: Good  Language: Good  Akathisia:  No  Handed:  Right  AIMS (if indicated): not done  Assets:  Communication Skills Desire for Improvement Physical Health Resilience Social Support  ADL's:  Intact  Cognition: WNL  Sleep:  Good   Screenings: GAD-7    Flowsheet Row Office Visit from 10/29/2023 in [facility] Office Visit from 09/14/2023 in [facility] Office Visit from 08/01/2023 in [facility] Office Visit from 05/18/2023 in [facility] Office Visit from 05/02/2023 in [facility]  Total GAD-7 Score 0 0 1 0 0      PHQ2-9    Flowsheet Row Office Visit from 10/29/2023 in [facility] Office Visit from  09/14/2023 in [facility] Office Visit from 08/01/2023 in [facility] Office Visit from 05/18/2023 in [facility] Office Visit from 05/02/2023 in [facility]  PHQ-2 Total Score 0 0 0 0 0  PHQ-9 Total Score 1 0 2 1 0      Flowsheet Row Video Visit from 08/22/2022 in [facility] [place] Video Visit from 06/23/2022 in [facility] [place] Video Visit from 04/19/2022 in [facility]  C-SSRS RISK CATEGORY No Risk No Risk No Risk        Assessment and Plan: This patient is a 22 year old female with a history of depression anxiety mood swings sleep difficulties fibromyalgia chronic fatigue POTS and ADD.  She still feels like her depression anxiety and focus are well-controlled.  She will continue Strattera 60 mg daily for ADD, Vraylar 1.5 mg daily for mood stabilization, Zoloft 75 mg daily for depression and BuSpar 10 mg twice daily for anxiety.  She rarely uses hydroxyzine or clonazepam for anxiety.  She will return to see me in 3 months  Collaboration of Care: Collaboration of Care: Primary Care Provider AEB notes are shared with PCP on the epic system  Patient/Guardian was advised Release of Information must be obtained prior to any record release in order to collaborate their care with an outside provider. Patient/Guardian was advised if they have not already done so to contact the registration department to sign all necessary forms in order for [place] to release information regarding their care.   Consent: Patient/Guardian gives verbal consent for treatment and assignment of benefits for services provided during this visit. Patient/Guardian expressed understanding and agreed to proceed.    [name], MD 11/13/2023, 11:41 AM

## 2023-11-16 ENCOUNTER — Encounter: Payer: Self-pay | Admitting: Allergy & Immunology

## 2023-11-19 ENCOUNTER — Ambulatory Visit (HOSPITAL_BASED_OUTPATIENT_CLINIC_OR_DEPARTMENT_OTHER): Payer: MEDICAID | Admitting: Family

## 2023-11-28 ENCOUNTER — Other Ambulatory Visit: Payer: Self-pay | Admitting: Internal Medicine

## 2023-11-29 ENCOUNTER — Encounter (HOSPITAL_BASED_OUTPATIENT_CLINIC_OR_DEPARTMENT_OTHER): Payer: MEDICAID | Admitting: Family

## 2023-12-03 DIAGNOSIS — J4550 Severe persistent asthma, uncomplicated: Secondary | ICD-10-CM | POA: Diagnosis not present

## 2023-12-06 ENCOUNTER — Encounter: Payer: Self-pay | Admitting: Allergy & Immunology

## 2023-12-14 ENCOUNTER — Ambulatory Visit: Payer: MEDICAID | Admitting: Family Medicine

## 2023-12-16 ENCOUNTER — Encounter: Payer: Self-pay | Admitting: Allergy & Immunology

## 2023-12-17 ENCOUNTER — Telehealth: Payer: Self-pay

## 2023-12-17 ENCOUNTER — Other Ambulatory Visit (HOSPITAL_COMMUNITY): Payer: Self-pay

## 2023-12-17 NOTE — Telephone Encounter (Signed)
*  Asthma/Allergy   Pharmacy Patient Advocate Encounter   Received notification from Patient Advice Request messages that prior authorization for Fexofenadine  HCl 180MG  tablets  is required/requested.   Insurance verification completed.   The patient is insured through [employer] .   Per test claim: PA required; PA submitted to above mentioned insurance via CoverMyMeds Key/confirmation #/EOC [id number] Status is pending

## 2023-12-17 NOTE — Telephone Encounter (Signed)
 PA request has been Submitted. New Encounter created for follow up. For additional info see Pharmacy Prior Auth telephone encounter from 01/13.

## 2023-12-18 ENCOUNTER — Other Ambulatory Visit (HOSPITAL_COMMUNITY): Payer: Self-pay

## 2023-12-18 NOTE — Telephone Encounter (Signed)
 I called patient's mother and verified DPR. Informed of PA approval.

## 2023-12-18 NOTE — Telephone Encounter (Signed)
 Pharmacy Patient Advocate Encounter  Received notification from [facility] that Prior Authorization for Fexofenadine  HCl 180MG  tablets  has been APPROVED from 12/17/2023 to 12/15/2024. Ran test claim, Copay is $0.00. This test claim was processed through [facility]- copay amounts may vary at other pharmacies due to pharmacy/plan contracts, or as the patient moves through the different stages of their insurance plan.

## 2023-12-20 ENCOUNTER — Encounter (HOSPITAL_BASED_OUTPATIENT_CLINIC_OR_DEPARTMENT_OTHER): Payer: Self-pay | Admitting: Family

## 2023-12-20 ENCOUNTER — Ambulatory Visit (HOSPITAL_BASED_OUTPATIENT_CLINIC_OR_DEPARTMENT_OTHER): Payer: MEDICAID | Admitting: Family

## 2023-12-20 ENCOUNTER — Encounter (HOSPITAL_BASED_OUTPATIENT_CLINIC_OR_DEPARTMENT_OTHER): Payer: Self-pay

## 2023-12-20 VITALS — BP 103/73 | HR 100 | Ht 59.0 in | Wt 227.8 lb

## 2023-12-20 DIAGNOSIS — I951 Orthostatic hypotension: Secondary | ICD-10-CM | POA: Diagnosis not present

## 2023-12-20 DIAGNOSIS — Q211 Atrial septal defect, unspecified: Secondary | ICD-10-CM | POA: Diagnosis not present

## 2023-12-20 DIAGNOSIS — R0683 Snoring: Secondary | ICD-10-CM

## 2023-12-20 DIAGNOSIS — R002 Palpitations: Secondary | ICD-10-CM | POA: Diagnosis not present

## 2023-12-20 DIAGNOSIS — I10 Essential (primary) hypertension: Secondary | ICD-10-CM

## 2023-12-20 DIAGNOSIS — G4730 Sleep apnea, unspecified: Secondary | ICD-10-CM

## 2023-12-20 NOTE — Progress Notes (Signed)
Advanced Hypertension Clinic Assessment:    Date:  12/20/2023   ID:  Molly [name], DOB March 09, 2001, MRN [id number]  PCP:  Sonny Masters, FNP  Cardiologist:  None  Nephrologist:  Referring MD: Sonny Masters, FNP   CC: Hypertension  History of Present Illness:    Molly [name] is a 23 y.o. female with a hx of ASD s/p repair 2012, POTS, HTN, HLD, asthma, migraines, gastritis, hiatal hernia, scoliosis, obesity, hyperhidrosis, ADHD, PTSD, anxiety, depression, suicidal ideation here to follow up in the Advanced Hypertension Clinic.   Previously has followed with Dr. Graciela Husbands for palpitations and intermittent attacks of diaphoresis with shaking and cold hands with sensation of being flushed.  Thyroid function normal.  Monitor November 2023 average heart rate of 109 bpm with rare PAC and PVC.  Prior [place] workup 2017-2018 with pediatric endocrinology at [facility]. Her brother has low cortisol and her mother has hypothyroidism.   Established with Dr. Duke Salvia and hypertension clinic 11/22/2022.  Her blood pressure was uncontrolled and bisoprolol was increased to 10 mg. 12/01/22 cortisol 0.4.Marland Kitchen 24h urine normetanephrines 451 but otherwise unremarkable. CT abdomen 12/20/22 normal adrenal glands. Scattered mesenteric and right lower quadrant lymph nodes not pathologically enlarged, likely reactive. She was referred to endocrine.   Has been followed for hypertension at early age following ASD repair in 2012. She has had intermittent "attacks" worse since 2021 associated with diaphoresis, elevated heart rates, palpitations, dizziness, imbalance. Was diagnosed with POTS by her PCP at 23 years old. Echo 12/2022 LVEF 55-60%, mild LVH, no residual shunting across ASD repair.   Seen by Advanced Hypertension Clinic 03/29/23 with episodes less severe. Had established with new gynecologist and birth control was tapered down improving diaphoresis. Orthostatic symptoms persisted. Her BP was  controlled and bisoprolol continued. When she saw Dr. Graciela Husbands 05/18/23 her Bisoprolol was reduced. At visit 07/05/23 due to more orthostatic symptoms she was encouraged to increase food/fluid as reported drinking "hardly anything". Nebivolol dose continued as palpitations were quiescent.   Presents today for follow-up with her mother.  She is working at a daycare. Reports BP has been overall controlled. Lightheaded spells three times per week, mom notes "worsening POTS symptoms". 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 Her goal is to drink 40 oz which she does not always meet. She is eating dinner but not always eating lunch prior to work shifts 2-6p of 11a-6p. Will have palpitations with her orthostasic symptoms but otherwise controlled.   Previous antihypertensives: Amlodipine Lisinopril  Past Medical History:  Diagnosis Date   Acid reflux 2010   Acne vulgaris 2017   ADHD (attention deficit hyperactivity disorder) 2011   Adverse food reaction 07/11/2016   Shellfish allergy   Amenorrhea 2019   Anaphylactic shock due to adverse food reaction 01/15/2018   Anxiety 2017   ASD (atrial septal defect) 11/22/2022   Chronic constipation 09/05/2011   Chronic tension headaches 2011   originally followed by [facility] K.Griffin. Then Dr Alexis Goodell [facility] 03/2020 (see above)    Coalition, calcaneus navicular    Dysmenorrhea    Eczema    Eustachian tube dysfunction, left 10/23/2019   Frequent headaches    Galactorrhea    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 04/01/2019   Hiatal hernia    History of atrial septal defect repair 12/04/2011   Hyperlipidemia 2013   Hypertension 2011   Hypertension 09/28/2020   hypertension, cellulitus    Idiopathic urticaria 09/05/2011   Insomnia  Keratosis pilaris 01/15/2018   Mass of left thigh 11/22/2017   Overview:  Added automatically from request for surgery [id number]   MDD (major depressive disorder), severe (HCC) 03/06/2018   Migraine 2011   re-diagnosed  as chronic migraines 03/2020 [facility] - starting on preventative    Obesity without serious comorbidity with body mass index (BMI) in 95th to 98th percentile for age in pediatric patient 09/04/2018   PCOS (polycystic ovarian syndrome)    Piriformis syndrome    POTS (postural orthostatic tachycardia syndrome)    PTSD (post-traumatic stress disorder)    Scoliosis 2019   Seasonal and perennial allergic rhinitis 2010   Severe persistent asthma without complication 2010   Sleep paralysis    Suicidal ideation 03/07/2018   Thyroid disease    underactive thyroid   Tic disorder 2019    Past Surgical History:  Procedure Laterality Date   ASD REPAIR  2012   with Helix, via Cardiac Catheterization   atrial septal defecgt     CARDIAC SURGERY     DENTAL SURGERY  2020   lipoma removal  2019   LUMBAR PUNCTURE  05/20/2021   TONSILLECTOMY AND ADENOIDECTOMY  2010    Current Medications: Current Meds  Medication Sig   albuterol (PROVENTIL) (2.5 MG/3ML) 0.083% nebulizer solution Take 3 mLs (2.5 mg total) by nebulization every 6 (six) hours as needed for wheezing or shortness of breath.   ascorbic acid (VITAMIN C) 500 MG tablet Take 500 mg by mouth daily.   atomoxetine (STRATTERA) 60 MG capsule Take 1 capsule (60 mg total) by mouth daily.   Azelastine-Fluticasone 137-50 MCG/ACT SUSP Place 1 spray into both nostrils 2 (two) times daily.   baclofen (LIORESAL) 10 MG tablet Take 10 mg by mouth 3 (three) times daily.   bisoprolol (ZEBETA) 5 MG tablet Take 1.5 tablets (7.5 mg total) by mouth daily.   budesonide-formoterol (SYMBICORT) 160-4.5 MCG/ACT inhaler Inhale 1 puff into the lungs daily.   busPIRone (BUSPAR) 10 MG tablet Take 1 tablet (10 mg total) by mouth 2 (two) times daily.   calcium carbonate (OS-CAL) 1250 (500 Ca) MG chewable tablet Chew by mouth.   cariprazine (VRAYLAR) 1.5 MG capsule Take 1 capsule (1.5 mg total) by mouth daily.   Cholecalciferol 25 MCG (1000 UT) tablet Take 1 Units by  mouth daily at 2 PM.   clobetasol (TEMOVATE) 0.05 % external solution APPLY TO AFFECTED AREA TWICE A DAY   clonazePAM (KLONOPIN) 0.5 MG tablet Take 1 tablet (0.5 mg total) by mouth 2 (two) times daily as needed for anxiety.   dicyclomine (BENTYL) 10 MG capsule Take 10 mg by mouth as needed for spasms.   EPIPEN 2-PAK 0.3 MG/0.3ML SOAJ injection INJECT 0.3 MG INTO THE MUSCLE AS NEEDED FOR ANAPHYLAXIS.   fexofenadine (ALLEGRA) 180 MG tablet TAKE 1 TABLET BY MOUTH EVERY DAY   Galcanezumab-gnlm 120 MG/ML SOAJ Inject into the skin. Emgality   hydrOXYzine (VISTARIL) 25 MG capsule TAKE 1-2 CAPSULES (25-50 MG TOTAL) BY MOUTH DAILY AS NEEDED.   levothyroxine (SYNTHROID) 50 MCG tablet Take 1 tablet (50 mcg total) by mouth daily before breakfast.   LINZESS 72 MCG capsule Take 72 mcg by mouth daily.   Loratadine (CLARITIN PO) Take by mouth.   metoprolol tartrate (LOPRESSOR) 25 MG tablet TAKE 1 TABLET (25 MG TOTAL) BY MOUTH AS NEEDED.   montelukast (SINGULAIR) 10 MG tablet Take 1 tablet (10 mg total) by mouth daily.   Multiple Vitamin (MULTIVITAMIN) capsule Take by mouth.  norethindrone (MICRONOR) 0.35 MG tablet Take 1 tablet (0.35 mg total) by mouth daily.   Olopatadine HCl 0.2 % SOLN Apply 1 drop to eye every morning.   ondansetron (ZOFRAN-ODT) 4 MG disintegrating tablet DISSOLVE ON TONGUE AT ONSET OF NAUSEA- 30 DAY SUPPLY (Patient taking differently: Take 8 mg by mouth every 8 (eight) hours.)   pantoprazole (PROTONIX) 40 MG tablet Take 1 tablet by mouth in the morning and at bedtime.   Polyethylene Glycol 3350 (MIRALAX PO) Take by mouth as needed.   RESTASIS 0.05 % ophthalmic emulsion 1 drop 2 (two) times daily.   sertraline (ZOLOFT) 50 MG tablet Take 1.5 tablets (75 mg total) by mouth daily.   topiramate (TOPAMAX) 50 MG tablet Take 100 mg by mouth 2 (two) times daily.   VENTOLIN HFA 108 (90 Base) MCG/ACT inhaler INHALE 2 PUFFS BY MOUTH EVERY 4 HOURS AS NEEDED FOR WHEEZE OR FOR SHORTNESS OF BREATH      Allergies:   Glucosamine forte [nutritional supplements], Nutritional supplements, Oyster extract, Shellfish-derived products, Cats claw (uncaria tomentosa), Diamox [acetazolamide], Lactose, Lactose intolerance (gi), Topiramate, and Bromfed dm [pseudoeph-bromphen-dm]   Social History   Socioeconomic History   Marital status: Single    Spouse name: Not on file   Number of children: Not on file   Years of education: Not on file   Highest education level: Not on file  Occupational History   Not on file  Tobacco Use   Smoking status: Never   Smokeless tobacco: Never  Vaping Use   Vaping status: Never Used  Substance and Sexual Activity   Alcohol use: No    Alcohol/week: 0.0 standard drinks of alcohol   Drug use: No   Sexual activity: Never    Birth control/protection: Pill  Other Topics Concern   Not on file  Social History Narrative   Not on file   Social Drivers of Health   Financial Resource Strain: Low Risk  (03/06/2023)   Overall Financial Resource Strain (CARDIA)    Difficulty of Paying Living Expenses: Not hard at all  Food Insecurity: No Food Insecurity (03/06/2023)   Hunger Vital Sign    Worried About Running Out of Food in the Last Year: Never true    Ran Out of Food in the Last Year: Never true  Transportation Needs: No Transportation Needs (03/06/2023)   PRAPARE - Administrator, Civil Service (Medical): No    Lack of Transportation (Non-Medical): No  Physical Activity: Inactive (03/06/2023)   Exercise Vital Sign    Days of Exercise per Week: 0 days    Minutes of Exercise per Session: 0 min  Stress: No Stress Concern Present (03/06/2023)   [employer] of Occupational Health - Occupational Stress Questionnaire    Feeling of Stress : Only a little  Social Connections: Socially Isolated (03/06/2023)   Social Connection and Isolation Panel [NHANES]    Frequency of Communication with Friends and Family: Three times a week    Frequency of Social  Gatherings with Friends and Family: Once a week    Attends Religious Services: Never    Database administrator or Organizations: No    Attends Engineer, structural: Never    Marital Status: Never married     Family History: The patient's family history includes ADD / ADHD in her brother and father; Alcohol abuse in her father; Allergic rhinitis in her brother and mother; Anxiety disorder in her mother; Arthritis in her mother; Asthma in her brother  and mother; Bipolar disorder in her father, maternal grandfather, and mother; Depression in her mother; Diabetes in her mother; Drug abuse in her father; Heart attack in her maternal grandfather; Heart disease in her maternal grandmother; Hyperlipidemia in her maternal grandmother and mother; Hypertension in her maternal grandmother and mother; Learning disabilities in her brother; Mental illness in her father and mother; Miscarriages / [place] in her mother; Thyroid disease in her mother. There is no history of Angioedema, Eczema, Immunodeficiency, or Urticaria.  ROS:   Please see the history of present illness.     All other systems reviewed and are negative.  EKGs/Labs/Other Studies Reviewed:    EKG:  EKG is not ordered today.    Recent Labs: 05/18/2023: ALT 40; BUN 8; Creatinine, Ser 0.78; Hemoglobin 13.5; Platelets 255; Potassium 3.7; Sodium 144 10/05/2023: TSH 3.730   Recent Lipid Panel    Component Value Date/Time   CHOL 181 (H) 05/29/2023 1030   TRIG 134 (H) 05/29/2023 1030   HDL 44 05/29/2023 1030   CHOLHDL 4.1 05/29/2023 1030   CHOLHDL 3.3 03/07/2018 0713   VLDL 20 03/07/2018 0713   LDLCALC 113 (H) 05/29/2023 1030   Physical Exam:   VS:  BP 103/73   Pulse 100   Ht 4\' 11"  (1.499 m)   Wt 227 lb 12.8 oz (103.3 kg)   SpO2 97%   BMI 46.01 kg/m  , BMI Body mass index is 46.01 kg/m. GENERAL:  Well appearing, overweight HEENT: Pupils equal round and reactive, fundi not visualized, oral mucosa unremarkable NECK:  No  jugular venous distention, waveform within normal limits, carotid upstroke brisk and symmetric, no bruits, no thyromegaly LYMPHATICS:  No cervical adenopathy LUNGS:  Clear to auscultation bilaterally HEART:  RRR.  PMI not displaced or sustained,S1 and S2 within normal limits, no S3, no S4, no clicks, no rubs, no murmurs ABD:  Flat, positive bowel sounds normal in frequency in pitch, no bruits, no rebound, no guarding, no midline pulsatile mass, no hepatomegaly, no splenomegaly EXT:  2 plus pulses throughout, no edema, no cyanosis no clubbing SKIN:  No rashes no nodules NEURO:  Cranial nerves II through XII grossly intact, motor grossly intact throughout PSYCH:  Cognitively intact, oriented to person place and time   ASSESSMENT/PLAN:    HTN - BP at goal <130/80, though relatively hypotensive. Continue Bisoprolol 7.5mg  daily. Again discussed need to eat regular meals and drink at least 64 ounces of water if not more per day as this is likely etiology of her orthostatic symptoms. We discussed that she could trial reducing dose of Bisoprolol to 5mg  but if she had recurrent palpitations to return to 7.5mg  dose.   Spells with flushing episodes / Palpitations / Orthostatic intolerance / Tachycardia - Echo 12/2022 normal LVEF, ASD closure functioning appropriately. Likely element of autonomic dysfunction. Reiterated need to increase hydration and dietary intake. Discussed protein and fiber rich snacks for prior to work. Recommend recumbent exercise as likely more tolerable than exercising outside in the heat. Already established with endocrinology.   ?OSA - Has established with Dr. Vassie Loll and was recommended for home sleep study. Has not yet been scheduled, will reach out to Dr. Vassie Loll for next steps.   Obesity - Weight loss via diet and exercise encouraged. Discussed the impact being overweight would have on cardiovascular risk. Congratulated on weight loss.   Screening for Secondary Hypertension:      11/22/2022    3:55 PM 12/31/2022    8:11 PM  Causes  Drugs/Herbals  Screened      - Comments No caffeine or energy drinks.   Sleep Apnea  Screened     - Comments  pending workup with pulmonology  Thyroid Disease  Screened     - Comments  normal TSH 10/2022  Pheochromocytoma  Screened     - Comments  12/2022 CT no adrenal nodules  Cushing's Syndrome  Screened     - Comments  11/2022 normal cortisol  Compliance  Screened    Relevant Labs/Studies:    Latest Ref Rng & Units 05/18/2023   12:08 PM 10/11/2022    3:56 PM 03/06/2022   11:02 AM  Basic Labs  Sodium 134 - 144 mmol/L 144  142  143   Potassium 3.5 - 5.2 mmol/L 3.7  4.0  4.5   Creatinine 0.57 - 1.00 mg/dL 8.11  9.14  7.82        Latest Ref Rng & Units 10/05/2023   11:00 AM 05/29/2023   10:30 AM  Thyroid   TSH 0.450 - 4.500 uIU/mL 3.730  4.790           Latest Ref Rng & Units 10/05/2023   11:00 AM 05/29/2023   10:30 AM  Metanephrines/Catecholamines   Metanephrines 0.0 - 88.0 pg/mL <25.0  <25.0   Normetanephrines  0.0 - 210.1 pg/mL 66.8  58.4            Disposition:    FU with MD/PharmD in 6 months    Medication Adjustments/Labs and Tests Ordered: Current medicines are reviewed at length with the patient today.  Concerns regarding medicines are outlined above.  No orders of the defined types were placed in this encounter.  No orders of the defined types were placed in this encounter.    Signed, Alver Sorrow, NP  12/20/2023 2:28 PM    [facility]

## 2023-12-20 NOTE — Patient Instructions (Addendum)
Medication Instructions:   When done with steroid, try decreasing to Bisoprolol to one tablet. If palpitations increase, okay to go back to 1.5 tablets.   Follow-Up:  In 6 months with Hypertension Clinic   Special Instructions:   Our goal is for your blood pressure 110/60-130/80  Try drinking a full bottle of Ice with meds in the morning and before work. Recommend adding a small meal or snack before work at lunchtime. Knee high compression stockings 15-20 mmHg

## 2023-12-21 ENCOUNTER — Other Ambulatory Visit: Payer: Self-pay | Admitting: Adult Health

## 2023-12-23 ENCOUNTER — Encounter (HOSPITAL_BASED_OUTPATIENT_CLINIC_OR_DEPARTMENT_OTHER): Payer: Self-pay | Admitting: Family

## 2023-12-24 ENCOUNTER — Other Ambulatory Visit: Payer: Self-pay | Admitting: Internal Medicine

## 2023-12-25 ENCOUNTER — Other Ambulatory Visit: Payer: Self-pay | Admitting: Internal Medicine

## 2023-12-27 ENCOUNTER — Encounter (HOSPITAL_BASED_OUTPATIENT_CLINIC_OR_DEPARTMENT_OTHER): Payer: Self-pay

## 2023-12-27 NOTE — Addendum Note (Signed)
Addended by: Alver Sorrow on: 12/27/2023 12:03 PM   Modules accepted: Orders

## 2024-01-02 DIAGNOSIS — J4550 Severe persistent asthma, uncomplicated: Secondary | ICD-10-CM | POA: Diagnosis not present

## 2024-01-03 ENCOUNTER — Ambulatory Visit: Payer: MEDICAID

## 2024-01-03 ENCOUNTER — Encounter: Payer: Self-pay | Admitting: Allergy & Immunology

## 2024-01-07 ENCOUNTER — Other Ambulatory Visit (HOSPITAL_COMMUNITY): Payer: Self-pay

## 2024-01-11 ENCOUNTER — Encounter: Payer: Self-pay | Admitting: Allergy & Immunology

## 2024-01-16 ENCOUNTER — Ambulatory Visit: Payer: MEDICAID | Admitting: Obstetrics & Gynecology

## 2024-01-16 ENCOUNTER — Encounter: Payer: Self-pay | Admitting: Obstetrics & Gynecology

## 2024-01-16 VITALS — BP 123/86 | HR 90 | Ht 59.0 in | Wt 225.2 lb

## 2024-01-16 DIAGNOSIS — E282 Polycystic ovarian syndrome: Secondary | ICD-10-CM

## 2024-01-16 DIAGNOSIS — R102 Pelvic and perineal pain: Secondary | ICD-10-CM | POA: Diagnosis not present

## 2024-01-16 NOTE — Progress Notes (Signed)
GYN VISIT Patient name: Molly [name] MRN [id number]  Date of birth: [birth year] Chief Complaint:   Follow-up  History of Present Illness:   Molly [name] is a 23 y.o. G0P0000 female being seen today for follow up regarding:  PCOS/Pelvic pain: On POPs- still not every month, but mostly last about a week.  Menses are tolerable.  Still having some mild cramping.  Alternate between heating pack/ice and now using Midol, which did seem to help.  Overall feels like her symptoms are tolerable.  Of note, recently had a UTI and now currently spotting.  Reassured pt that this a common occurrence and should resolve   Patient's last menstrual period was 12/24/2023.    Review of Systems:   Pertinent items are noted in HPI Denies fever/chills, dizziness, headaches, visual disturbances, fatigue, shortness of breath, chest pain, abdominal pain, vomiting. Pertinent History Reviewed:   Past Surgical History:  Procedure Laterality Date   ASD REPAIR  2012   with Helix, via Cardiac Catheterization   atrial septal defecgt     CARDIAC SURGERY     DENTAL SURGERY  2020   lipoma removal  2019   LUMBAR PUNCTURE  05/20/2021   TONSILLECTOMY AND ADENOIDECTOMY  2010    Past Medical History:  Diagnosis Date   Acid reflux 2010   Acne vulgaris 2017   ADHD (attention deficit hyperactivity disorder) 2011   Adverse food reaction 07/11/2016   Shellfish allergy   Amenorrhea 2019   Anaphylactic shock due to adverse food reaction 01/15/2018   Anxiety 2017   ASD (atrial septal defect) 11/22/2022   Chronic constipation 09/05/2011   Chronic tension headaches 2011   originally followed by [facility] K.Griffin. Then Dr Alexis Goodell [facility] 03/2020 (see above)    Coalition, calcaneus navicular    Dysmenorrhea    Eczema    Eustachian tube dysfunction, left 10/23/2019   Frequent headaches    Galactorrhea    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 04/01/2019   Hiatal hernia    History of atrial septal  defect repair 12/04/2011   Hyperlipidemia 2013   Hypertension 2011   Hypertension 09/28/2020   hypertension, cellulitus    Idiopathic urticaria 09/05/2011   Insomnia    Keratosis pilaris 01/15/2018   Mass of left thigh 11/22/2017   Overview:  Added automatically from request for surgery [id number]   MDD (major depressive disorder), severe (HCC) 03/06/2018   Migraine 2011   re-diagnosed as chronic migraines 03/2020 [facility] - starting on preventative    Obesity without serious comorbidity with body mass index (BMI) in 95th to 98th percentile for age in pediatric patient 09/04/2018   PCOS (polycystic ovarian syndrome)    Piriformis syndrome    POTS (postural orthostatic tachycardia syndrome)    PTSD (post-traumatic stress disorder)    Scoliosis 2019   Seasonal and perennial allergic rhinitis 2010   Severe persistent asthma without complication 2010   Sleep paralysis    Suicidal ideation 03/07/2018   Thyroid disease    underactive thyroid   Tic disorder 2019   Reviewed problem list, medications and allergies. Physical Assessment:   Vitals:   01/16/24 1525  BP: 123/86  Pulse: 90  Weight: 225 lb 3.2 oz (102.2 kg)  Height: 4\' 11"  (1.499 m)  Body mass index is 45.48 kg/m.       Physical Examination:   General appearance: alert, well appearing, and in no distress  Psych: mood appropriate, normal affect  Skin: warm & dry   Cardiovascular:  normal heart rate noted  Respiratory: normal respiratory effort, no distress  Extremities: no edema   Chaperone: N/A    Assessment & Plan:  1) PCOS, pelvic pain -Doing well with current medication -Reviewed conservative medical management options, which she is already doing such as Midol, heat pack and/or ice when needed -Briefly discussed adding my February which patient did not feel she needed at this time  -continue with POPs  No orders of the defined types were placed in this encounter.   Return in about 1 year (around 01/15/2025)  for Annual- Jan 2026.   Myna Hidalgo, DO Attending Obstetrician & Gynecologist, [facility] for [employer], [facility]

## 2024-01-18 ENCOUNTER — Encounter: Payer: Self-pay | Admitting: Family Medicine

## 2024-01-18 ENCOUNTER — Ambulatory Visit (INDEPENDENT_AMBULATORY_CARE_PROVIDER_SITE_OTHER): Payer: MEDICAID | Admitting: Family Medicine

## 2024-01-18 VITALS — BP 127/80 | HR 88 | Temp 95.8°F | Ht 59.0 in | Wt 229.0 lb

## 2024-01-18 DIAGNOSIS — H6593 Unspecified nonsuppurative otitis media, bilateral: Secondary | ICD-10-CM | POA: Diagnosis not present

## 2024-01-18 MED ORDER — PREDNISONE 20 MG PO TABS
40.0000 mg | ORAL_TABLET | Freq: Every day | ORAL | 0 refills | Status: AC
Start: 2024-01-18 — End: 2024-01-23

## 2024-01-18 NOTE — Progress Notes (Signed)
Subjective:  Patient ID: Molly [name], female    DOB: 06-Feb-2001, 23 y.o.   MRN: [id number]  Patient Care Team: Sonny Masters, FNP as PCP - General (Family Medicine) Dellis Anes Hetty Ely, MD as Consulting Physician (Allergy and Immunology) Myrlene Broker, MD as Consulting Physician [facility]) Shaune Pollack, MD as Consulting Physician (Neurology) Alen Bleacher, Winferd Humphrey, MD as Consulting Physician (Pediatric Gastroenterology)   Chief Complaint:  Ear Pain (Bilateral x 3 days )   HPI: Molly [name] is a 23 y.o. female presenting on 01/18/2024 for Ear Pain (Bilateral x 3 days )   Discussed the use of AI scribe software for clinical note transcription with the patient, who gave verbal consent to proceed.  History of Present Illness   DOLORES [name] "Molly [name]" is a 23 year old female who presents with bilateral ear pain, worse on the left side.  She has been experiencing bilateral ear pain since Tuesday, with the left ear being more painful than the right. The pain is described as a sensation of swelling and muffling, particularly in the left ear.  Her hearing is affected, with the left ear feeling more muffled than the right. No fever is present.  She has not tried any specific treatments for the ear pain. She uses a nasal spray daily and has been on Allegra for a long time, though she is unsure of the exact duration. Claritin was added more recently to help with throat issues, such as frequent throat clearing.          Relevant past medical, surgical, family, and social history reviewed and updated as indicated.  Allergies and medications reviewed and updated. Data reviewed: Chart in Epic.   Past Medical History:  Diagnosis Date   Acid reflux 2010   Acne vulgaris 2017   ADHD (attention deficit hyperactivity disorder) 2011   Adverse food reaction 07/11/2016   Shellfish allergy   Amenorrhea 2019   Anaphylactic shock due to adverse food  reaction 01/15/2018   Anxiety 2017   ASD (atrial septal defect) 11/22/2022   Chronic constipation 09/05/2011   Chronic tension headaches 2011   originally followed by [facility] K.Griffin. Then Dr Alexis Goodell [facility] 03/2020 (see above)    Coalition, calcaneus navicular    Dysmenorrhea    Eczema    Eustachian tube dysfunction, left 10/23/2019   Frequent headaches    Galactorrhea    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 04/01/2019   Hiatal hernia    History of atrial septal defect repair 12/04/2011   Hyperlipidemia 2013   Hypertension 2011   Hypertension 09/28/2020   hypertension, cellulitus    Idiopathic urticaria 09/05/2011   Insomnia    Keratosis pilaris 01/15/2018   Mass of left thigh 11/22/2017   Overview:  Added automatically from request for surgery [id number]   MDD (major depressive disorder), severe (HCC) 03/06/2018   Migraine 2011   re-diagnosed as chronic migraines 03/2020 [facility] - starting on preventative    Obesity without serious comorbidity with body mass index (BMI) in 95th to 98th percentile for age in pediatric patient 09/04/2018   PCOS (polycystic ovarian syndrome)    Piriformis syndrome    POTS (postural orthostatic tachycardia syndrome)    PTSD (post-traumatic stress disorder)    Scoliosis 2019   Seasonal and perennial allergic rhinitis 2010   Severe persistent asthma without complication 2010   Sleep paralysis    Suicidal ideation 03/07/2018   Thyroid disease    underactive thyroid  Tic disorder 2019    Past Surgical History:  Procedure Laterality Date   ASD REPAIR  2012   with Helix, via Cardiac Catheterization   atrial septal defecgt     CARDIAC SURGERY     DENTAL SURGERY  2020   lipoma removal  2019   LUMBAR PUNCTURE  05/20/2021   TONSILLECTOMY AND ADENOIDECTOMY  2010    Social History   Socioeconomic History   Marital status: Single    Spouse name: Not on file   Number of children: Not on file   Years of education: Not on file   Highest  education level: Not on file  Occupational History   Not on file  Tobacco Use   Smoking status: Never   Smokeless tobacco: Never  Vaping Use   Vaping status: Never Used  Substance and Sexual Activity   Alcohol use: No    Alcohol/week: 0.0 standard drinks of alcohol   Drug use: No   Sexual activity: Never    Birth control/protection: Pill  Other Topics Concern   Not on file  Social History Narrative   Not on file   Social Drivers of Health   Financial Resource Strain: Low Risk  (03/06/2023)   Overall Financial Resource Strain (CARDIA)    Difficulty of Paying Living Expenses: Not hard at all  Food Insecurity: No Food Insecurity (03/06/2023)   Hunger Vital Sign    Worried About Running Out of Food in the Last Year: Never true    Ran Out of Food in the Last Year: Never true  Transportation Needs: No Transportation Needs (03/06/2023)   PRAPARE - Administrator, Civil Service (Medical): No    Lack of Transportation (Non-Medical): No  Physical Activity: Inactive (03/06/2023)   Exercise Vital Sign    Days of Exercise per Week: 0 days    Minutes of Exercise per Session: 0 min  Stress: No Stress Concern Present (03/06/2023)   [employer] of Occupational Health - Occupational Stress Questionnaire    Feeling of Stress : Only a little  Social Connections: Socially Isolated (03/06/2023)   Social Connection and Isolation Panel [NHANES]    Frequency of Communication with Friends and Family: Three times a week    Frequency of Social Gatherings with Friends and Family: Once a week    Attends Religious Services: Never    Database administrator or Organizations: No    Attends Banker Meetings: Never    Marital Status: Never married  Intimate Partner Violence: Not At Risk (03/06/2023)   Humiliation, Afraid, Rape, and Kick questionnaire    Fear of Current or Ex-Partner: No    Emotionally Abused: No    Physically Abused: No    Sexually Abused: No    Outpatient  Encounter Medications as of 01/18/2024  Medication Sig   albuterol (PROVENTIL) (2.5 MG/3ML) 0.083% nebulizer solution Take 3 mLs (2.5 mg total) by nebulization every 6 (six) hours as needed for wheezing or shortness of breath.   ascorbic acid (VITAMIN C) 500 MG tablet Take 500 mg by mouth daily.   atomoxetine (STRATTERA) 60 MG capsule Take 1 capsule (60 mg total) by mouth daily.   Azelastine-Fluticasone 137-50 MCG/ACT SUSP Place 1 spray into both nostrils 2 (two) times daily.   baclofen (LIORESAL) 10 MG tablet Take 10 mg by mouth 3 (three) times daily.   bisoprolol (ZEBETA) 5 MG tablet Take 1.5 tablets (7.5 mg total) by mouth daily.   budesonide-formoterol (SYMBICORT) 160-4.5 MCG/ACT inhaler  Inhale 1 puff into the lungs daily.   busPIRone (BUSPAR) 10 MG tablet Take 1 tablet (10 mg total) by mouth 2 (two) times daily.   calcium carbonate (OS-CAL) 1250 (500 Ca) MG chewable tablet Chew by mouth.   cariprazine (VRAYLAR) 1.5 MG capsule Take 1 capsule (1.5 mg total) by mouth daily.   Cholecalciferol 25 MCG (1000 UT) tablet Take 1 Units by mouth daily at 2 PM.   clobetasol (TEMOVATE) 0.05 % external solution APPLY TO AFFECTED AREA TWICE A DAY   clonazePAM (KLONOPIN) 0.5 MG tablet Take 1 tablet (0.5 mg total) by mouth 2 (two) times daily as needed for anxiety.   dicyclomine (BENTYL) 10 MG capsule Take 10 mg by mouth as needed for spasms.   EPIPEN 2-PAK 0.3 MG/0.3ML SOAJ injection INJECT 0.3 MG INTO THE MUSCLE AS NEEDED FOR ANAPHYLAXIS.   fexofenadine (ALLEGRA) 180 MG tablet TAKE 1 TABLET BY MOUTH EVERY DAY   Galcanezumab-gnlm 120 MG/ML SOAJ Inject into the skin. Emgality   hydrOXYzine (VISTARIL) 25 MG capsule TAKE 1-2 CAPSULES (25-50 MG TOTAL) BY MOUTH DAILY AS NEEDED.   levothyroxine (SYNTHROID) 50 MCG tablet Take 1 tablet (50 mcg total) by mouth daily before breakfast.   LINZESS 72 MCG capsule Take 72 mcg by mouth daily.   Loratadine (CLARITIN PO) Take by mouth.   metoprolol tartrate (LOPRESSOR) 25  MG tablet TAKE 1 TABLET (25 MG TOTAL) BY MOUTH AS NEEDED.   montelukast (SINGULAIR) 10 MG tablet Take 1 tablet (10 mg total) by mouth daily.   Multiple Vitamin (MULTIVITAMIN) capsule Take by mouth.   norethindrone (MICRONOR) 0.35 MG tablet TAKE 1 TABLET BY MOUTH EVERY DAY   Olopatadine HCl 0.2 % SOLN Apply 1 drop to eye every morning.   ondansetron (ZOFRAN-ODT) 4 MG disintegrating tablet DISSOLVE ON TONGUE AT ONSET OF NAUSEA- 30 DAY SUPPLY (Patient taking differently: Take 8 mg by mouth every 8 (eight) hours.)   pantoprazole (PROTONIX) 40 MG tablet Take 1 tablet by mouth in the morning and at bedtime.   Polyethylene Glycol 3350 (MIRALAX PO) Take by mouth as needed.   predniSONE (DELTASONE) 20 MG tablet Take 2 tablets (40 mg total) by mouth daily with breakfast for 5 days.   RESTASIS 0.05 % ophthalmic emulsion 1 drop 2 (two) times daily.   sertraline (ZOLOFT) 50 MG tablet Take 1.5 tablets (75 mg total) by mouth daily.   topiramate (TOPAMAX) 50 MG tablet Take 100 mg by mouth 2 (two) times daily.   VENTOLIN HFA 108 (90 Base) MCG/ACT inhaler INHALE 2 PUFFS BY MOUTH EVERY 4 HOURS AS NEEDED FOR WHEEZE OR FOR SHORTNESS OF BREATH   No facility-administered encounter medications on file as of 01/18/2024.    Allergies  Allergen Reactions   Brompheniramine Swelling and Anxiety   Glucosamine Forte [Nutritional Supplements] Anaphylaxis    Due to containing shellfish   Nutritional Supplements Anaphylaxis   Oyster Extract Anaphylaxis   Shellfish-Derived Products Anaphylaxis    Throat swelling   Cats Claw (Uncaria Tomentosa) Nausea And Vomiting   Diamox [Acetazolamide] Other (See Comments)    Numbness- feet, hands, lips   Lactose    Lactose Intolerance (Gi)    Topiramate Other (See Comments)    Numbness to face, hands and feet   Bromfed Dm [Pseudoeph-Bromphen-Dm] Anxiety    CARBOFED DM    Pertinent ROS per HPI, otherwise unremarkable      Objective:  BP 127/80   Pulse 88   Temp (!) 95.8 F  (35.4 C)  Ht 4\' 11"  (1.499 m)   Wt 229 lb (103.9 kg)   LMP 12/24/2023   SpO2 100%   BMI 46.25 kg/m    Wt Readings from Last 3 Encounters:  01/18/24 229 lb (103.9 kg)  01/16/24 225 lb 3.2 oz (102.2 kg)  12/20/23 227 lb 12.8 oz (103.3 kg)    Physical Exam Vitals and nursing note reviewed.  Constitutional:      General: She is not in acute distress.    Appearance: Normal appearance. She is morbidly obese. She is not ill-appearing, toxic-appearing or diaphoretic.  HENT:     Head: Normocephalic and atraumatic.     Right Ear: A middle ear effusion is present. Tympanic membrane is not erythematous.     Left Ear: A middle ear effusion is present. Tympanic membrane is not erythematous.     Nose: Nose normal.     Mouth/Throat:     Mouth: Mucous membranes are moist.     Pharynx: Oropharynx is clear.  Eyes:     Conjunctiva/sclera: Conjunctivae normal.     Pupils: Pupils are equal, round, and reactive to light.  Cardiovascular:     Rate and Rhythm: Normal rate and regular rhythm.     Heart sounds: Normal heart sounds.  Pulmonary:     Effort: Pulmonary effort is normal.     Breath sounds: Normal breath sounds.  Musculoskeletal:     Cervical back: Neck supple.  Skin:    General: Skin is warm and dry.     Capillary Refill: Capillary refill takes less than 2 seconds.  Neurological:     General: No focal deficit present.     Mental Status: She is alert and oriented to person, place, and time.  Psychiatric:        Mood and Affect: Mood normal.        Behavior: Behavior normal. Behavior is cooperative.        Thought Content: Thought content normal.        Judgment: Judgment normal.      Results for orders placed or performed in visit on 10/29/23  Rapid Strep Screen ([facility] ONLY)   Collection Time: 10/29/23  3:01 PM   Specimen: Other   Other  Result Value Ref Range   Strep Gp A Ag, IA W/Reflex Negative Negative  Culture, Group A Strep   Collection Time: 10/29/23   3:01 PM   Other  Result Value Ref Range   Strep A Culture CANCELED   Veritor Flu A/B Waived   Collection Time: 10/29/23  3:02 PM  Result Value Ref Range   Influenza A Negative Negative   Influenza B Negative Negative  Novel Coronavirus, NAA ([facility])   Collection Time: 10/29/23  3:36 PM   Specimen: Nasopharyngeal(NP) swabs in vial transport medium  Result Value Ref Range   SARS-CoV-2, NAA Not Detected Not Detected       Pertinent labs & imaging results that were available during my care of the patient were reviewed by me and considered in my medical decision making.  Assessment & Plan:  Laterrica "Molly [name]" was seen today for ear pain.  Diagnoses and all orders for this visit:  Fluid level behind tympanic membrane of both ears -     predniSONE (DELTASONE) 20 MG tablet; Take 2 tablets (40 mg total) by mouth daily with breakfast for 5 days.     Assessment and Plan    Bilateral Otitis Media with Effusion Bilateral ear pain since Tuesday, left ear worse  than right, with muffled hearing and sensation of swelling. No fever. Physical exam shows fluid behind both ears, consistent with otitis media with effusion. Currently on nasal spray, Allegra, and Claritin for allergy management. Discussed steroid use, potential side effects (increased blood pressure and blood sugar), and expected outcome of reducing eustachian tube inflammation to facilitate fluid drainage. - Prescribe short course of steroids to reduce eustachian tube inflammation and facilitate fluid drainage - Continue nasal spray and antihistamines (Allegra and Claritin) - Report if symptoms worsen or no improvement with steroids  Allergic Rhinitis Allergic rhinitis managed with nasal spray, Allegra, and Claritin. Claritin added by asthma doctor for throat clearing issues. - Continue nasal spray and antihistamines (Allegra and Claritin).          Continue all other maintenance medications.  Follow up plan: Return if  symptoms worsen or fail to improve.   Continue healthy lifestyle choices, including diet (rich in fruits, vegetables, and lean proteins, and low in salt and simple carbohydrates) and exercise (at least 30 minutes of moderate physical activity daily).  Educational handout given for eustachian tube dysfunction  The above assessment and management plan was discussed with the patient. The patient verbalized understanding of and has agreed to the management plan. Patient is aware to call the clinic if they develop any new symptoms or if symptoms persist or worsen. Patient is aware when to return to the clinic for a follow-up visit. Patient educated on when it is appropriate to go to the emergency department.   Kari Baars, FNP-C [facility] [phone]

## 2024-01-25 ENCOUNTER — Encounter: Payer: Self-pay | Admitting: Allergy & Immunology

## 2024-01-29 MED ORDER — CLOBETASOL PROPIONATE 0.05 % EX SOLN: Freq: Two times a day (BID) | CUTANEOUS | 3 refills | Status: DC | PRN

## 2024-02-01 ENCOUNTER — Encounter (HOSPITAL_BASED_OUTPATIENT_CLINIC_OR_DEPARTMENT_OTHER): Payer: Self-pay

## 2024-02-01 DIAGNOSIS — J4550 Severe persistent asthma, uncomplicated: Secondary | ICD-10-CM | POA: Diagnosis not present

## 2024-02-04 NOTE — Telephone Encounter (Signed)
 Sleep team please advise

## 2024-02-06 ENCOUNTER — Encounter (HOSPITAL_COMMUNITY): Payer: Self-pay | Admitting: Psychiatry

## 2024-02-06 ENCOUNTER — Telehealth (HOSPITAL_COMMUNITY): Payer: MEDICAID | Admitting: Psychiatry

## 2024-02-06 DIAGNOSIS — F333 Major depressive disorder, recurrent, severe with psychotic symptoms: Secondary | ICD-10-CM

## 2024-02-06 DIAGNOSIS — F902 Attention-deficit hyperactivity disorder, combined type: Secondary | ICD-10-CM

## 2024-02-06 MED ORDER — SERTRALINE HCL 50 MG PO TABS: 75.0000 mg | ORAL_TABLET | Freq: Every day | ORAL | 1 refills | Status: DC

## 2024-02-06 MED ORDER — CARIPRAZINE HCL 1.5 MG PO CAPS: 1.5000 mg | ORAL_CAPSULE | Freq: Every day | ORAL | 2 refills | Status: DC

## 2024-02-06 MED ORDER — BUSPIRONE HCL 10 MG PO TABS: 10.0000 mg | ORAL_TABLET | Freq: Two times a day (BID) | ORAL | 2 refills | Status: DC

## 2024-02-06 MED ORDER — ATOMOXETINE HCL 60 MG PO CAPS: 60.0000 mg | ORAL_CAPSULE | Freq: Every day | ORAL | 1 refills | Status: DC

## 2024-02-06 MED ORDER — HYDROXYZINE PAMOATE 25 MG PO CAPS
25.0000 mg | ORAL_CAPSULE | Freq: Every day | ORAL | 1 refills | Status: DC | PRN
Start: 2024-02-06 — End: 2024-05-09

## 2024-02-06 NOTE — Progress Notes (Signed)
 Virtual Visit via Video Note  I connected with Molly [name] on 02/06/24 at 11:20 AM EST by a video enabled telemedicine application and verified that I am speaking with the correct person using two identifiers.  Location: Patient: home Provider: office   I discussed the limitations of evaluation and management by telemedicine and the availability of in person appointments. The patient expressed understanding and agreed to proceed.      I discussed the assessment and treatment plan with the patient. The patient was provided an opportunity to ask questions and all were answered. The patient agreed with the plan and demonstrated an understanding of the instructions.   The patient was advised to call back or seek an in-person evaluation if the symptoms worsen or if the condition fails to improve as anticipated.  I provided 20 minutes of non-face-to-face time during this encounter.   Molly Ruder, MD  [facility] MD/PA/NP OP Progress Note  02/06/2024 11:36 AM Molly [name]  MRN:  [id number]  Chief Complaint:  Chief Complaint  Patient presents with   Anxiety   Depression   Follow-up   HPI: This patient is a 23 year old female lives with her mother mother's fianc and a younger brother in [place]. 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.   The patient returns for follow-up after 3 months regarding her depression and anxiety as well as ADD.  For the most part she is doing very well.  She really enjoys her job.  She met a boyfriend online and they are communicating quite frequently.  Hopefully they will meet in person soon she is still struggling with the POTS and at times feels lightheaded.  She is trying to do better with drinking water.  She does have some difficulty with awakenings during sleep and she is about to have a sleep study.  Otherwise her health has been stable.  She denies significant depression anxiety  symptoms or thoughts of self-harm. Visit Diagnosis:    ICD-10-CM   1. Attention deficit hyperactivity disorder (ADHD), combined type  F90.2     2. Severe episode of recurrent major depressive disorder, with psychotic features (HCC)  F33.3       Past Psychiatric History: Past psychiatric admissions at a younger age, 2 years of therapy [facility]  Past Medical History:  Past Medical History:  Diagnosis Date   Acid reflux 2010   Acne vulgaris 2017   ADHD (attention deficit hyperactivity disorder) 2011   Adverse food reaction 07/11/2016   Shellfish allergy   Amenorrhea 2019   Anaphylactic shock due to adverse food reaction 01/15/2018   Anxiety 2017   ASD (atrial septal defect) 11/22/2022   Chronic constipation 09/05/2011   Chronic tension headaches 2011   originally followed by [facility]. Then Dr [facility] 03/2020 (see above)    Coalition, calcaneus navicular    Dysmenorrhea    Eczema    Eustachian tube dysfunction, left 10/23/2019   Frequent headaches    Galactorrhea    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 04/01/2019   Hiatal hernia    History of atrial septal defect repair 12/04/2011   Hyperlipidemia 2013   Hypertension 2011   Hypertension 09/28/2020   hypertension, cellulitus    Idiopathic urticaria 09/05/2011   Insomnia    Keratosis pilaris 01/15/2018   Mass of left thigh 11/22/2017   Overview:  Added automatically from request for surgery [id number]   MDD (  major depressive disorder), severe (HCC) 03/06/2018   Migraine 2011   re-diagnosed as chronic migraines 03/2020 [facility] - starting on preventative    Obesity without serious comorbidity with body mass index (BMI) in 95th to 98th percentile for age in pediatric patient 09/04/2018   PCOS (polycystic ovarian syndrome)    Piriformis syndrome    POTS (postural orthostatic tachycardia syndrome)    PTSD (post-traumatic stress disorder)    Scoliosis 2019   Seasonal and perennial allergic rhinitis 2010    Severe persistent asthma without complication 2010   Sleep paralysis    Suicidal ideation 03/07/2018   Thyroid disease    underactive thyroid   Tic disorder 2019    Past Surgical History:  Procedure Laterality Date   ASD REPAIR  2012   with Helix, via Cardiac Catheterization   atrial septal defecgt     CARDIAC SURGERY     DENTAL SURGERY  2020   lipoma removal  2019   LUMBAR PUNCTURE  05/20/2021   TONSILLECTOMY AND ADENOIDECTOMY  2010    Family Psychiatric History: See below  Family History:  Family History  Problem Relation Age of Onset   Hypertension Mother    Hyperlipidemia Mother    Diabetes Mother    Depression Mother    Arthritis Mother    Allergic rhinitis Mother    Asthma Mother    Bipolar disorder Mother    Anxiety disorder Mother    Mental illness Mother    Miscarriages / [place] Mother    Thyroid disease Mother    Bipolar disorder Father    Drug abuse Father    Alcohol abuse Father    ADD / ADHD Father    Mental illness Father    Allergic rhinitis Brother    Asthma Brother    ADD / ADHD Brother    Learning disabilities Brother    Hypertension Maternal Grandmother    Hyperlipidemia Maternal Grandmother    Heart disease Maternal Grandmother    Heart attack Maternal Grandfather    Bipolar disorder Maternal Grandfather    Angioedema Neg Hx    Eczema Neg Hx    Immunodeficiency Neg Hx    Urticaria Neg Hx     Social History:  Social History   Socioeconomic History   Marital status: Single    Spouse name: Not on file   Number of children: Not on file   Years of education: Not on file   Highest education level: Not on file  Occupational History   Not on file  Tobacco Use   Smoking status: Never   Smokeless tobacco: Never  Vaping Use   Vaping status: Never Used  Substance and Sexual Activity   Alcohol use: No    Alcohol/week: 0.0 standard drinks of alcohol   Drug use: No   Sexual activity: Never    Birth control/protection: Pill  Other  Topics Concern   Not on file  Social History Narrative   Not on file   Social Drivers of Health   Financial Resource Strain: Low Risk  (03/06/2023)   Overall Financial Resource Strain (CARDIA)    Difficulty of Paying Living Expenses: Not hard at all  Food Insecurity: Low Risk  (01/21/2024)   Received from [facility]   Hunger Vital Sign    Worried About Running Out of Food in the Last Year: Never true    Ran Out of Food in the Last Year: Never true  Transportation Needs: No Transportation Needs (01/21/2024)  Received from [facility]    In the past 12 months, has lack of reliable transportation kept you from medical appointments, meetings, work or from getting things needed for daily living? : No  Physical Activity: Inactive (03/06/2023)   Exercise Vital Sign    Days of Exercise per Week: 0 days    Minutes of Exercise per Session: 0 min  Stress: No Stress Concern Present (03/06/2023)   [employer] of Occupational Health - Occupational Stress Questionnaire    Feeling of Stress : Only a little  Social Connections: Socially Isolated (03/06/2023)   Social Connection and Isolation Panel [NHANES]    Frequency of Communication with Friends and Family: Three times a week    Frequency of Social Gatherings with Friends and Family: Once a week    Attends Religious Services: Never    Database administrator or Organizations: No    Attends Banker Meetings: Never    Marital Status: Never married    Allergies:  Allergies  Allergen Reactions   Brompheniramine Swelling and Anxiety   Glucosamine Forte [Nutritional Supplements] Anaphylaxis    Due to containing shellfish   Nutritional Supplements Anaphylaxis   Oyster Extract Anaphylaxis   Shellfish-Derived Products Anaphylaxis    Throat swelling   Cats Claw (Uncaria Tomentosa) Nausea And Vomiting   Diamox [Acetazolamide] Other (See Comments)    Numbness- feet, hands, lips   Lactose    Lactose Intolerance  (Gi)    Topiramate Other (See Comments)    Numbness to face, hands and feet   Bromfed Dm [Pseudoeph-Bromphen-Dm] Anxiety    CARBOFED DM    Metabolic Disorder Labs: Lab Results  Component Value Date   HGBA1C 5.1 10/11/2023   MPG 105.41 03/07/2018   Lab Results  Component Value Date   PROLACTIN 49.7 (H) 03/07/2018   Lab Results  Component Value Date   CHOL 181 (H) 05/29/2023   TRIG 134 (H) 05/29/2023   HDL 44 05/29/2023   CHOLHDL 4.1 05/29/2023   VLDL 20 03/07/2018   LDLCALC 113 (H) 05/29/2023   LDLCALC 108 05/18/2023   Lab Results  Component Value Date   TSH 3.730 10/05/2023   TSH 4.790 (H) 05/29/2023    Therapeutic Level Labs: No results found for: "LITHIUM" No results found for: "VALPROATE" No results found for: "CBMZ"  Current Medications: Current Outpatient Medications  Medication Sig Dispense Refill   albuterol (PROVENTIL) (2.5 MG/3ML) 0.083% nebulizer solution Take 3 mLs (2.5 mg total) by nebulization every 6 (six) hours as needed for wheezing or shortness of breath. 75 mL 1   ascorbic acid (VITAMIN C) 500 MG tablet Take 500 mg by mouth daily.     atomoxetine (STRATTERA) 60 MG capsule Take 1 capsule (60 mg total) by mouth daily. 90 capsule 1   Azelastine-Fluticasone 137-50 MCG/ACT SUSP Place 1 spray into both nostrils 2 (two) times daily. 23 g 5   baclofen (LIORESAL) 10 MG tablet Take 10 mg by mouth 3 (three) times daily.     bisoprolol (ZEBETA) 5 MG tablet Take 1.5 tablets (7.5 mg total) by mouth daily. 135 tablet 3   budesonide-formoterol (SYMBICORT) 160-4.5 MCG/ACT inhaler Inhale 1 puff into the lungs daily. 10.2 g 5   busPIRone (BUSPAR) 10 MG tablet Take 1 tablet (10 mg total) by mouth 2 (two) times daily. 180 tablet 2   calcium carbonate (OS-CAL) 1250 (500 Ca) MG chewable tablet Chew by mouth.     cariprazine (VRAYLAR) 1.5 MG capsule Take 1  capsule (1.5 mg total) by mouth daily. 30 capsule 2   Cholecalciferol 25 MCG (1000 UT) tablet Take 1 Units by mouth  daily at 2 PM.     clobetasol (TEMOVATE) 0.05 % external solution Apply topically 2 (two) times daily as needed. 50 mL 3   clonazePAM (KLONOPIN) 0.5 MG tablet Take 1 tablet (0.5 mg total) by mouth 2 (two) times daily as needed for anxiety. 60 tablet 2   dicyclomine (BENTYL) 10 MG capsule Take 10 mg by mouth as needed for spasms.     EPIPEN 2-PAK 0.3 MG/0.3ML SOAJ injection INJECT 0.3 MG INTO THE MUSCLE AS NEEDED FOR ANAPHYLAXIS. 2 each 2   fexofenadine (ALLEGRA) 180 MG tablet TAKE 1 TABLET BY MOUTH EVERY DAY 90 tablet 2   Galcanezumab-gnlm 120 MG/ML SOAJ Inject into the skin. Emgality     hydrOXYzine (VISTARIL) 25 MG capsule Take 1-2 capsules (25-50 mg total) by mouth daily as needed. 180 capsule 1   levothyroxine (SYNTHROID) 50 MCG tablet Take 1 tablet (50 mcg total) by mouth daily before breakfast. 90 tablet 1   LINZESS 72 MCG capsule Take 72 mcg by mouth daily.     Loratadine (CLARITIN PO) Take by mouth.     metoprolol tartrate (LOPRESSOR) 25 MG tablet TAKE 1 TABLET (25 MG TOTAL) BY MOUTH AS NEEDED. 90 tablet 1   montelukast (SINGULAIR) 10 MG tablet Take 1 tablet (10 mg total) by mouth daily. 90 tablet 1   Multiple Vitamin (MULTIVITAMIN) capsule Take by mouth.     norethindrone (MICRONOR) 0.35 MG tablet TAKE 1 TABLET BY MOUTH EVERY DAY 84 tablet 3   Olopatadine HCl 0.2 % SOLN Apply 1 drop to eye every morning.     ondansetron (ZOFRAN-ODT) 4 MG disintegrating tablet DISSOLVE ON TONGUE AT ONSET OF NAUSEA- 30 DAY SUPPLY (Patient taking differently: Take 8 mg by mouth every 8 (eight) hours.) 10 tablet 1   pantoprazole (PROTONIX) 40 MG tablet Take 1 tablet by mouth in the morning and at bedtime.     Polyethylene Glycol 3350 (MIRALAX PO) Take by mouth as needed.     RESTASIS 0.05 % ophthalmic emulsion 1 drop 2 (two) times daily.     sertraline (ZOLOFT) 50 MG tablet Take 1.5 tablets (75 mg total) by mouth daily. 135 tablet 1   topiramate (TOPAMAX) 50 MG tablet Take 100 mg by mouth 2 (two) times  daily.     VENTOLIN HFA 108 (90 Base) MCG/ACT inhaler INHALE 2 PUFFS BY MOUTH EVERY 4 HOURS AS NEEDED FOR WHEEZE OR FOR SHORTNESS OF BREATH 18 each 0   No current facility-administered medications for this visit.     Musculoskeletal: Strength & Muscle Tone: within normal limits Gait & Station: normal Patient leans: N/A  Psychiatric Specialty Exam: Review of Systems  Neurological:  Positive for syncope.  All other systems reviewed and are negative.   Last menstrual period 12/24/2023.There is no height or weight on file to calculate BMI.  General Appearance: Casual and Fairly Groomed  Eye Contact:  Good  Speech:  Clear and Coherent  Volume:  Normal  Mood:  Euthymic  Affect:  Congruent  Thought Process:  Goal Directed  Orientation:  Full (Time, Place, and Person)  Thought Content: WDL   Suicidal Thoughts:  No  Homicidal Thoughts:  No  Memory:  Immediate;   Good Recent;   Good Remote;   NA  Judgement:  Good  Insight:  Fair  Psychomotor Activity:  Normal  Concentration:  Concentration: Good  and Attention Span: Good  Recall:  Good  Fund of Knowledge: Good  Language: Good  Akathisia:  No  Handed:  Right  AIMS (if indicated): not done  Assets:  Communication Skills Desire for Improvement Resilience Social Support Talents/Skills  ADL's:  Intact  Cognition: WNL  Sleep:  Fair   Screenings: GAD-7    Flowsheet Row Office Visit from 10/29/2023 in [facility] Office Visit from 09/14/2023 in [facility] Office Visit from 08/01/2023 in [facility] Office Visit from 05/18/2023 in [facility] Office Visit from 05/02/2023 in [facility]  Total GAD-7 Score 0 0 1 0 0      PHQ2-9    Flowsheet Row Office Visit from 10/29/2023 in [facility] Office Visit from 09[facility]14/2023 in [facility] Office Visit from 08/01/2023 in [facility] Office Visit from 05/18/2023 in [facility] Office Visit from 05/02/2023 in [facility]  PHQ-2 Total Score 0 0 0 0 0  PHQ-9 Total Score 1 0 2 1 0      Flowsheet Row Video Visit from 08/22/2022 in [facility] [place] Video Visit from 06/23/2022 in [facility] [place] Video Visit from 04/19/2022 in [facility]  C-SSRS RISK CATEGORY No Risk No Risk No Risk        Assessment and Plan: This patient is a 23 year old female with a history depression anxiety mood swings sleep difficulties fibromyalgia chronic fatigue POTS and ADD.  She does feel like her depression anxiety and focus are well-controlled.  She will continue Strattera 60 mg daily for ADD, Vraylar 1.5 mg daily for mood stabilization, Zoloft 75 mg daily for depression and BuSpar 10 mg twice daily for anxiety.  She occasionally uses hydroxyzine for anxiety.  She will return to see me in 3 months  Collaboration of Care: Collaboration of Care: Primary Care Provider AEB notes are shared with PCP on the epic system  Patient/Guardian was advised Release of Information must be obtained prior to any record release in order to collaborate their care with an outside provider. Patient/Guardian was advised if they have not already done so to contact the registration department to sign all necessary forms in order for [place] to release information regarding their care.   Consent: Patient/Guardian gives verbal consent for treatment and assignment of benefits for services provided during this visit. Patient/Guardian expressed understanding and agreed to proceed.    Molly Ruder, MD 02/06/2024, 11:36 AM

## 2024-02-09 LAB — COMPREHENSIVE METABOLIC PANEL
ALT: 16 IU/L (ref 0–32)
AST: 15 IU/L (ref 0–40)
Albumin: 4.1 g/dL (ref 4.0–5.0)
Alkaline Phosphatase: 84 IU/L (ref 42–106)
BUN/Creatinine Ratio: 14 (ref 9–23)
BUN: 12 mg/dL (ref 6–20)
Bilirubin Total: <0.2 mg/dL (ref 0.0–1.2)
CO2: 21 mmol/L (ref 20–29)
Calcium: 9.6 mg/dL (ref 8.7–10.2)
Chloride: 110 mmol/L — ABNORMAL HIGH (ref 96–106)
Creatinine, Ser: 0.83 mg/dL (ref 0.57–1.00)
Globulin, Total: 1.7 g/dL (ref 1.5–4.5)
Glucose: 84 mg/dL (ref 70–99)
Potassium: 4.6 mmol/L (ref 3.5–5.2)
Sodium: 147 mmol/L — ABNORMAL HIGH (ref 134–144)
Total Protein: 5.8 g/dL — ABNORMAL LOW (ref 6.0–8.5)
eGFR: 103 mL/min/{1.73_m2} (ref >59)

## 2024-02-09 LAB — LIPID PANEL
Chol/HDL Ratio: 4.2 ratio (ref 0.0–4.4)
Cholesterol, Total: 198 mg/dL (ref 100–199)
HDL: 47 mg/dL (ref >39)
LDL Chol Calc (NIH): 124 mg/dL — ABNORMAL HIGH (ref 0–99)
Triglycerides: 151 mg/dL — ABNORMAL HIGH (ref 0–149)
VLDL Cholesterol Cal: 27 mg/dL (ref 5–40)

## 2024-02-09 LAB — TSH: TSH: 2.2 u[IU]/mL (ref 0.450–4.500)

## 2024-02-09 LAB — T4, FREE: Free T4: 1.01 ng/dL (ref 0.82–1.77)

## 2024-02-11 ENCOUNTER — Encounter: Payer: Self-pay | Admitting: Allergy & Immunology

## 2024-02-13 ENCOUNTER — Ambulatory Visit (INDEPENDENT_AMBULATORY_CARE_PROVIDER_SITE_OTHER): Payer: MEDICAID | Admitting: "Endocrinology

## 2024-02-13 ENCOUNTER — Encounter: Payer: Self-pay | Admitting: "Endocrinology

## 2024-02-13 VITALS — BP 106/66 | HR 88 | Ht 59.0 in | Wt 227.4 lb

## 2024-02-13 DIAGNOSIS — E039 Hypothyroidism, unspecified: Secondary | ICD-10-CM

## 2024-02-13 DIAGNOSIS — R7303 Prediabetes: Secondary | ICD-10-CM | POA: Diagnosis not present

## 2024-02-13 DIAGNOSIS — E782 Mixed hyperlipidemia: Secondary | ICD-10-CM | POA: Diagnosis not present

## 2024-02-13 MED ORDER — LEVOTHYROXINE SODIUM 50 MCG PO TABS: 50.0000 ug | ORAL_TABLET | Freq: Every day | ORAL | 1 refills | Status: DC

## 2024-02-13 NOTE — Patient Instructions (Signed)

## 2024-02-13 NOTE — Progress Notes (Signed)
 02/13/2024, 6:53 PM  Endocrinology follow-up note   Subjective:    Patient ID: Molly [name], female    DOB: [birth year], PCP Sonny Masters, FNP   Past Medical History:  Diagnosis Date   Acid reflux 2010   Acne vulgaris 2017   ADHD (attention deficit hyperactivity disorder) 2011   Adverse food reaction 07/11/2016   Shellfish allergy   Amenorrhea 2019   Anaphylactic shock due to adverse food reaction 01/15/2018   Anxiety 2017   ASD (atrial septal defect) 11/22/2022   Chronic constipation 09/05/2011   Chronic tension headaches 2011   originally followed by [facility] K.Griffin. Then Dr Alexis Goodell [facility] 03/2020 (see above)    Coalition, calcaneus navicular    Dysmenorrhea    Eczema    Eustachian tube dysfunction, left 10/23/2019   Frequent headaches    Galactorrhea    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 04/01/2019   Hiatal hernia    History of atrial septal defect repair 12/04/2011   Hyperlipidemia 2013   Hypertension 2011   Hypertension 09/28/2020   hypertension, cellulitus    Idiopathic urticaria 09/05/2011   Insomnia    Keratosis pilaris 01/15/2018   Mass of left thigh 11/22/2017   Overview:  Added automatically from request for surgery [id number]   MDD (major depressive disorder), severe (HCC) 03/06/2018   Migraine 2011   re-diagnosed as chronic migraines 03/2020 [facility] - starting on preventative    Obesity without serious comorbidity with body mass index (BMI) in 95th to 98th percentile for age in pediatric patient 09/04/2018   PCOS (polycystic ovarian syndrome)    Piriformis syndrome    POTS (postural orthostatic tachycardia syndrome)    PTSD (post-traumatic stress disorder)    Scoliosis 2019   Seasonal and perennial allergic rhinitis 2010   Severe persistent asthma without complication 2010   Sleep paralysis    Suicidal ideation 03/07/2018   Thyroid disease    underactive thyroid   Tic disorder 2019    Past Surgical History:  Procedure Laterality Date   ASD REPAIR  2012   with Helix, via Cardiac Catheterization   atrial septal defecgt     CARDIAC SURGERY     DENTAL SURGERY  2020   lipoma removal  2019   LUMBAR PUNCTURE  05/20/2021   TONSILLECTOMY AND ADENOIDECTOMY  2010   Social History   Socioeconomic History   Marital status: Single    Spouse name: Not on file   Number of children: Not on file   Years of education: Not on file   Highest education level: Not on file  Occupational History   Not on file  Tobacco Use   Smoking status: Never   Smokeless tobacco: Never  Vaping Use   Vaping status: Never Used  Substance and Sexual Activity   Alcohol use: No    Alcohol/week: 0.0 standard drinks of alcohol   Drug use: No   Sexual activity: Never    Birth control/protection: Pill  Other Topics Concern   Not on file  Social History Narrative   Not on file   Social Drivers of Health   Financial Resource Strain: Low Risk  (03/06/2023)   Overall Financial Resource Strain (CARDIA)  Difficulty of Paying Living Expenses: Not hard at all  Food Insecurity: Low Risk  (01/21/2024)   Received from [facility]   Hunger Vital Sign    Worried About Running Out of Food in the Last Year: Never true    Ran Out of Food in the Last Year: Never true  Transportation Needs: No Transportation Needs (01/21/2024)   Received from [facility]    In the past 12 months, has lack of reliable transportation kept you from medical appointments, meetings, work or from getting things needed for daily living? : No  Physical Activity: Inactive (03/06/2023)   Exercise Vital Sign    Days of Exercise per Week: 0 days    Minutes of Exercise per Session: 0 min  Stress: No Stress Concern Present (03/06/2023)   [employer] of Occupational Health - Occupational Stress Questionnaire    Feeling of Stress : Only a little  Social Connections: Socially Isolated (03/06/2023)   Social  Connection and Isolation Panel [NHANES]    Frequency of Communication with Friends and Family: Three times a week    Frequency of Social Gatherings with Friends and Family: Once a week    Attends Religious Services: Never    Database administrator or Organizations: No    Attends Engineer, structural: Never    Marital Status: Never married   Family History  Problem Relation Age of Onset   Hypertension Mother    Hyperlipidemia Mother    Diabetes Mother    Depression Mother    Arthritis Mother    Allergic rhinitis Mother    Asthma Mother    Bipolar disorder Mother    Anxiety disorder Mother    Mental illness Mother    Miscarriages / [place] Mother    Thyroid disease Mother    Bipolar disorder Father    Drug abuse Father    Alcohol abuse Father    ADD / ADHD Father    Mental illness Father    Allergic rhinitis Brother    Asthma Brother    ADD / ADHD Brother    Learning disabilities Brother    Hypertension Maternal Grandmother    Hyperlipidemia Maternal Grandmother    Heart disease Maternal Grandmother    Heart attack Maternal Grandfather    Bipolar disorder Maternal Grandfather    Angioedema Neg Hx    Eczema Neg Hx    Immunodeficiency Neg Hx    Urticaria Neg Hx    Outpatient Encounter Medications as of 02/13/2024  Medication Sig   RIZATRIPTAN BENZOATE PO Take by mouth.   albuterol (PROVENTIL) (2.5 MG/3ML) 0.083% nebulizer solution Take 3 mLs (2.5 mg total) by nebulization every 6 (six) hours as needed for wheezing or shortness of breath.   ascorbic acid (VITAMIN C) 500 MG tablet Take 500 mg by mouth daily.   atomoxetine (STRATTERA) 60 MG capsule Take 1 capsule (60 mg total) by mouth daily.   Azelastine-Fluticasone 137-50 MCG/ACT SUSP Place 1 spray into both nostrils 2 (two) times daily.   baclofen (LIORESAL) 10 MG tablet Take 10 mg by mouth 3 (three) times daily.   bisoprolol (ZEBETA) 5 MG tablet Take 1.5 tablets (7.5 mg total) by mouth daily. (Patient taking  differently: Take 5 mg by mouth daily.)   budesonide-formoterol (SYMBICORT) 160-4.5 MCG/ACT inhaler Inhale 1 puff into the lungs daily.   busPIRone (BUSPAR) 10 MG tablet Take 1 tablet (10 mg total) by mouth 2 (two) times daily.   calcium carbonate (OS-CAL) 1250 (  500 Ca) MG chewable tablet Chew by mouth.   cariprazine (VRAYLAR) 1.5 MG capsule Take 1 capsule (1.5 mg total) by mouth daily.   Cholecalciferol 25 MCG (1000 UT) tablet Take 1 Units by mouth daily at 2 PM.   clobetasol (TEMOVATE) 0.05 % external solution Apply topically 2 (two) times daily as needed.   clonazePAM (KLONOPIN) 0.5 MG tablet Take 1 tablet (0.5 mg total) by mouth 2 (two) times daily as needed for anxiety.   dicyclomine (BENTYL) 10 MG capsule Take 10 mg by mouth as needed for spasms.   EPIPEN 2-PAK 0.3 MG/0.3ML SOAJ injection INJECT 0.3 MG INTO THE MUSCLE AS NEEDED FOR ANAPHYLAXIS.   fexofenadine (ALLEGRA) 180 MG tablet TAKE 1 TABLET BY MOUTH EVERY DAY   Galcanezumab-gnlm 120 MG/ML SOAJ Inject into the skin. Emgality   hydrOXYzine (VISTARIL) 25 MG capsule Take 1-2 capsules (25-50 mg total) by mouth daily as needed.   levothyroxine (SYNTHROID) 50 MCG tablet Take 1 tablet (50 mcg total) by mouth daily before breakfast.   LINZESS 72 MCG capsule Take 72 mcg by mouth daily.   Loratadine (CLARITIN PO) Take by mouth.   metoprolol tartrate (LOPRESSOR) 25 MG tablet TAKE 1 TABLET (25 MG TOTAL) BY MOUTH AS NEEDED.   montelukast (SINGULAIR) 10 MG tablet Take 1 tablet (10 mg total) by mouth daily.   Multiple Vitamin (MULTIVITAMIN) capsule Take by mouth.   norethindrone (MICRONOR) 0.35 MG tablet TAKE 1 TABLET BY MOUTH EVERY DAY   Olopatadine HCl 0.2 % SOLN Apply 1 drop to eye every morning.   ondansetron (ZOFRAN-ODT) 4 MG disintegrating tablet DISSOLVE ON TONGUE AT ONSET OF NAUSEA- 30 DAY SUPPLY (Patient taking differently: Take 8 mg by mouth every 8 (eight) hours.)   pantoprazole (PROTONIX) 40 MG tablet Take 1 tablet by mouth in the  morning and at bedtime.   Polyethylene Glycol 3350 (MIRALAX PO) Take by mouth as needed.   RESTASIS 0.05 % ophthalmic emulsion 1 drop 2 (two) times daily.   sertraline (ZOLOFT) 50 MG tablet Take 1.5 tablets (75 mg total) by mouth daily.   topiramate (TOPAMAX) 50 MG tablet Take 100 mg by mouth 2 (two) times daily.   VENTOLIN HFA 108 (90 Base) MCG/ACT inhaler INHALE 2 PUFFS BY MOUTH EVERY 4 HOURS AS NEEDED FOR WHEEZE OR FOR SHORTNESS OF BREATH   [DISCONTINUED] levothyroxine (SYNTHROID) 50 MCG tablet Take 1 tablet (50 mcg total) by mouth daily before breakfast.   No facility-administered encounter medications on file as of 02/13/2024.   ALLERGIES: Allergies  Allergen Reactions   Brompheniramine Swelling and Anxiety   Glucosamine Forte [Nutritional Supplements] Anaphylaxis    Due to containing shellfish   Nutritional Supplements Anaphylaxis   Oyster Extract Anaphylaxis   Shellfish-Derived Products Anaphylaxis    Throat swelling   Cats Claw (Uncaria Tomentosa) Nausea And Vomiting   Diamox [Acetazolamide] Other (See Comments)    Numbness- feet, hands, lips   Lactose    Lactose Intolerance (Gi)    Topiramate Other (See Comments)    Numbness to face, hands and feet   Bromfed Dm [Pseudoeph-Bromphen-Dm] Anxiety    CARBOFED DM    VACCINATION STATUS: Immunization History  Administered Date(s) Administered   DTaP 09/24/2003, 12/10/2003, 02/16/2004, 11/01/2004, 07/23/2007, 07/09/2012   Dtap, Unspecified 11/01/2004, 07/23/2007   H1N1 10/07/2008, 11/12/2008   HIB (PRP-OMP) 09/24/2003, 12/10/2003, 02/16/2004, 11/01/2004, 07/23/2017   HIB (PRP-T) 09/24/2003, 12/10/2003, 02/16/2004   HIB, Unspecified 11/01/2004   HPV Quadrivalent 09/04/2013, 11/05/2013, 03/20/2014   Hep A, Unspecified 07/24/2006, 07/23/2007  Hep B, Unspecified 09/03/2003, 12/10/2003, 07/26/2004   IPV 09/24/2003, 12/10/2003, 11/01/2004, 07/23/2007   Influenza Nasal 08/10/2010, 09/03/2012, 09/04/2013, 09/14/2014   Influenza  Split 09/05/2011, 09/05/2021   Influenza, Seasonal, Injecte, Preservative Fre 10/12/2005, 10/07/2008, 09/27/2009   Influenza,Quad,Nasal, Live 09/03/2012, 09/04/2013, 09/14/2014   Influenza,inj,Quad PF,6+ Mos 08/30/2015, 09/21/2016, 09/13/2017, 09/06/2018, 08/27/2019, 11/12/2020, 09/05/2021, 10/11/2022   Influenza,inj,quad, With Preservative 10/12/2018   Influenza-Unspecified 09/05/2011, 09/05/2011, 08/29/2023   MMR 07/26/2004, 07/23/2007   Meningococcal B, OMV 10/21/2019, 05/13/2020   Meningococcal Mcv4o 09/14/2014, 10/21/2019   PFIZER Comirnaty(Gray Top)Covid-19 Tri-Sucrose Vaccine 04/01/2021, 05/06/2021   PPD Test 08/29/2023   Pfizer(Comirnaty)Fall Seasonal Vaccine 12 years and older 11/17/2022, 10/05/2023   Pneumococcal Conjugate PCV 7 09/24/2003, 12/10/2003, 02/16/2004, 11/01/2004   Pneumococcal Polysaccharide-23 07/09/2012, 03/06/2022   Pneumococcal-Unspecified 09/24/2003, 02/16/2004, 11/01/2004, 12/09/2006   Polio, Unspecified 11/01/2004, 07/23/2007   Tdap 09/14/2014   Varicella 07/26/2004, 07/23/2007    HPI MACKENZY GRUMBINE is 23 y.o. female who presents today with a medical history as above. she is being seen in follow-up after she was seen in consultation for hyperhidrosis requested by Sonny Masters, FNP.    She is accompanied by her mother to clinic.  She denies any prior history of adrenal, thyroid nor hypothalamic dysfunction.  Her previsit labs did not indicate a possibility of pheochromocytoma.   She does not have new complaints today.  She was started on treatment for hypothyroidism which she continues to tolerate.  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    She underwent several workup including 24-hour urine metanephrines measurements as well as adrenal CT scan which did not show any particular pathology.  Of note patient is on polypharmacy for multiple medical problems including mood disorders, bipolar disorders, anxiety  disorders. Patient has been overweight/obese for most of her life including during her pediatric ages. She presents with 6 pounds of weight loss, at a height of 4'11'' giving her a BMI of 45.93.      She has prediabetes, PCOS, hyperlipidemia, hypertension, besides her mental illnesses.  Her recent point-of-care A1c today is 5.1% indicating reversal of her prediabetes. She is 23 years old with 30+ medications on her list. Her active medications include Strattera, azelastine-fluticasone, bisoprolol fumarate, Symbicort, buspirone, cariprazine, Klonopin, Valium, Benadryl, Allegra, Vistaril, Singulair, Zoloft, Imitrex, Topamax.  There is significant family history of obesity, type 2 diabetes, hypertension, hyperlipidemia, asthma, thyroid dysfunction, multiple allergy conditions among others. She gives history of being suicidal in 2019, however the medications she is taking currently helped her regain her insight.  She is not suicidal anymore.  Patient is not following any particular diet or exercise program.  She has not engaged optimally to the lifestyle medicine we discussed last time. 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 She denies any exposure to heavy dose steroids, except in her bronchodilators.   Review of Systems  Constitutional: + Progressive weight gain,+ fatigue, no subjective hyperthermia, no subjective hypothermia Eyes: no blurry vision, no xerophthalmia   Objective:       02/13/2024    3:05 PM 01/18/2024    9:41 AM 01/16/2024    3:25 PM  Vitals with BMI  Height 4\' 11"  4\' 11"  4\' 11"   Weight 227 lbs 6 oz 229 lbs 225 lbs 3 oz  BMI 45.9 46.23 45.46  Systolic 106 127 098  Diastolic 66 80 86  Pulse 88 88 90    BP 106/66   Pulse 88   Ht 4\' 11"  (1.499 m)  Wt 227 lb 6.4 oz (103.1 kg)   LMP 12/24/2023   BMI 45.93 kg/m   Wt Readings from Last 3 Encounters:  02/13/24 227 lb 6.4 oz (103.1 kg)  01/18/24 229 lb (103.9 kg)  01/16/24 225 lb 3.2 oz (102.2 kg)     Physical Exam  Constitutional:  Body mass index is 45.93 kg/m.,  not in acute distress, normal state of mind Eyes: PERRLA, EOMI, no exophthalmos ENT: moist mucous membranes, no gross thyromegaly, no gross cervical lymphadenopathy   CMP ( most recent) CMP     Component Value Date/Time   NA 147 (H) 02/08/2024 1055   K 4.6 02/08/2024 1055   CL 110 (H) 02/08/2024 1055   CO2 21 02/08/2024 1055   GLUCOSE 84 02/08/2024 1055   GLUCOSE 103 (H) 04/05/2018 1818   BUN 12 02/08/2024 1055   CREATININE 0.83 02/08/2024 1055   CALCIUM 9.6 02/08/2024 1055   PROT 5.8 (L) 02/08/2024 1055   ALBUMIN 4.1 02/08/2024 1055   AST 15 02/08/2024 1055   ALT 16 02/08/2024 1055   ALKPHOS 84 02/08/2024 1055   BILITOT <0.2 02/08/2024 1055   EGFR 103 02/08/2024 1055   GFRNONAA NOT CALCULATED 04/05/2018 1818     Diabetic Labs (most recent): Lab Results  Component Value Date   HGBA1C 5.1 10/11/2023   HGBA1C 5.0 05/18/2023   HGBA1C 5.8 (H) 10/11/2022   MICROALBUR 150 06/20/2023     Lipid Panel ( most recent) Lipid Panel     Component Value Date/Time   CHOL 198 02/08/2024 1055   TRIG 151 (H) 02/08/2024 1055   HDL 47 02/08/2024 1055   CHOLHDL 4.2 02/08/2024 1055   CHOLHDL 3.3 03/07/2018 0713   VLDL 20 03/07/2018 0713   LDLCALC 124 (H) 02/08/2024 1055   LABVLDL 27 02/08/2024 1055      Lab Results  Component Value Date   TSH 2.200 02/08/2024   TSH 3.730 10/05/2023   TSH 4.790 (H) 05/29/2023   TSH 3.270 05/18/2023   TSH 1.550 03/06/2022   FREET4 1.01 02/08/2024   FREET4 1.12 10/05/2023   FREET4 1.07 05/29/2023   FREET4 1.10 03/06/2022     Recent Results (from the past 2160 hours)  Comprehensive metabolic panel     Status: Abnormal   Collection Time: 02/08/24 10:55 AM  Result Value Ref Range   Glucose 84 70 - 99 mg/dL   BUN 12 6 - 20 mg/dL   Creatinine, Ser 5.62 0.57 - 1.00 mg/dL   eGFR 130 >86 VH/QIO/9.62   BUN/Creatinine Ratio 14 9 - 23   Sodium 147 (H) 134 - 144 mmol/L    Potassium 4.6 3.5 - 5.2 mmol/L   Chloride 110 (H) 96 - 106 mmol/L   CO2 21 20 - 29 mmol/L   Calcium 9.6 8.7 - 10.2 mg/dL   Total Protein 5.8 (L) 6.0 - 8.5 g/dL   Albumin 4.1 4.0 - 5.0 g/dL   Globulin, Total 1.7 1.5 - 4.5 g/dL   Bilirubin Total <9.5 0.0 - 1.2 mg/dL   Alkaline Phosphatase 84 42 - 106 IU/L   AST 15 0 - 40 IU/L   ALT 16 0 - 32 IU/L  Lipid panel     Status: Abnormal   Collection Time: 02/08/24 10:55 AM  Result Value Ref Range   Cholesterol, Total 198 100 - 199 mg/dL   Triglycerides 284 (H) 0 - 149 mg/dL   HDL 47 >13 mg/dL   VLDL Cholesterol Cal 27 5 - 40 mg/dL  LDL Chol Calc (NIH) 124 (H) 0 - 99 mg/dL   Chol/HDL Ratio 4.2 0.0 - 4.4 ratio    Comment:                                   T. Chol/HDL Ratio                                             Men  Women                               1/2 Avg.Risk  3.4    3.3                                   Avg.Risk  5.0    4.4                                2X Avg.Risk  9.6    7.1                                3X Avg.Risk 23.4   11.0   TSH     Status: None   Collection Time: 02/08/24 10:55 AM  Result Value Ref Range   TSH 2.200 0.450 - 4.500 uIU/mL  T4, free     Status: None   Collection Time: 02/08/24 10:55 AM  Result Value Ref Range   Free T4 1.01 0.82 - 1.77 ng/dL     Assessment & Plan:   1. Hyperhidrosis 2. Prediabetes 3.  Morbid obesity 4.  Hypertension 5.  Hyperlipidemia 6.  Hypothyroidism I reviewed her new and available records with her. -Her previsit thyroid function tests are consistent with appropriate replacement.  She is advised to continue  levothyroxine to 50 mcg p.o. daily before breakfast.     - We discussed about the correct intake of her thyroid hormone, on empty stomach at fasting, with water, separated by at least 30 minutes from breakfast and other medications,  and separated by more than 4 hours from calcium, iron, multivitamins, acid reflux medications (PPIs). -Patient is made aware of the fact  that thyroid hormone replacement is needed for life, dose to be adjusted by periodic monitoring of thyroid function tests.    -She was previously worked up and ruled out for pheochromocytoma.   Previsit free plasma metanephrines are normal.    -Her workup rules out Cushing's syndrome. She is vitamin D replete. -She will be put on expectant management.  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  She has at least 8 medications with known side effects of hyperhidrosis including Strattera, azelastine-fluticasone, buspirone, cariprazine, Klonopin, Valium, Zoloft, Imitrex. -It seems like she is benefiting from this medications.  Unfortunately patient is on serious polypharmacy with 30+ medications at age 22.  Her medication list deserves to be revised and revisited by respective prescribers.  I advised her to address these concerns  during her next visits with her psychiatrist as well as primary care provider.  In light of her metabolic dysfunction with morbid obesity, PCOS, hyperlipidemia, hypertension, prediabetes, mental illness, this patient remains to be a good candidate for lifestyle medicine.   -She does not have ideal family situation to engage optimally, however she is trying. -She has not been diagnosed with hyperlipidemia. - she acknowledges that there is a room for improvement in her food and drink choices. - Suggestion is made for her to avoid simple carbohydrates  from her diet including Cakes, Sweet Desserts, Ice Cream, Soda (diet and regular), Sweet Tea, Candies, Chips, Cookies, Store Bought Juices, Alcohol , Artificial Sweeteners,  Coffee Creamer, and "Sugar-free" Products, Lemonade. This will help patient to have more stable blood glucose profile and potentially avoid unintended weight gain.  The following Lifestyle Medicine recommendations according to American College of Lifestyle  Medicine  [facility]) were discussed and and offered to patient and she  agrees to start the journey:  A. Whole Foods, Plant-Based Nutrition comprising of fruits and vegetables, plant-based proteins, whole-grain carbohydrates was discussed in detail with the patient.   A list for source of those nutrients were also provided to the patient.  Patient will use only water or unsweetened tea for hydration. B.  The need to stay away from risky substances including alcohol, smoking; obtaining 7 to 9 hours of restorative sleep, at least 150 minutes of moderate intensity exercise weekly, the importance of healthy social connections,  and stress management techniques were discussed. C.  A full color page of  Calorie density of various food groups per pound showing examples of each food groups was provided to the patient.   Even with partial engagement, this patient can drow significant benefit potentially coming off of most of her medications, and avoid medications for dyslipidemia, obesity.  - she is advised to maintain close follow up with Rakes, Doralee Albino, FNP for primary care needs.    I spent  25  minutes in the care of the patient today including review of labs from Thyroid Function, CMP, and other relevant labs ; imaging/biopsy records (current and previous including abstractions from other facilities); face-to-face time discussing  her lab results and symptoms, medications doses, her options of short and long term treatment based on the latest standards of care / guidelines;   and documenting the encounter.  Molly [name]  participated in the discussions, expressed understanding, and voiced agreement with the above plans.  All questions were answered to her satisfaction. she is encouraged to contact clinic should she have any questions or concerns prior to her return visit.    Follow up plan: Return in about 6 months (around 08/15/2024) for Fasting Labs  in AM B4 8, A1c -NV.   Marquis Lunch,  MD [facility] [address] [place], [place] [address] Phone: [phone]  Fax: [phone]     02/13/2024, 6:53 PM  This note was partially dictated with voice recognition software. Similar sounding words can be transcribed inadequately or may not  be corrected upon review.

## 2024-02-20 ENCOUNTER — Encounter: Payer: Self-pay | Admitting: Allergy & Immunology

## 2024-02-20 DIAGNOSIS — T7807XD Anaphylactic reaction due to milk and dairy products, subsequent encounter: Secondary | ICD-10-CM

## 2024-02-20 DIAGNOSIS — T78079D Anaphylactic reaction due to milk and dairy products, unspecified, subsequent encounter: Secondary | ICD-10-CM

## 2024-02-23 ENCOUNTER — Encounter: Payer: Self-pay | Admitting: Obstetrics & Gynecology

## 2024-02-25 ENCOUNTER — Other Ambulatory Visit: Payer: Self-pay | Admitting: Obstetrics & Gynecology

## 2024-02-25 DIAGNOSIS — R102 Pelvic and perineal pain: Secondary | ICD-10-CM

## 2024-02-25 MED ORDER — NAPROXEN 500 MG PO TABS: 500.0000 mg | ORAL_TABLET | Freq: Two times a day (BID) | ORAL | 3 refills | Status: DC

## 2024-02-25 NOTE — Progress Notes (Signed)
Rx for naproxen

## 2024-02-29 ENCOUNTER — Other Ambulatory Visit: Payer: MEDICAID

## 2024-03-03 DIAGNOSIS — J4550 Severe persistent asthma, uncomplicated: Secondary | ICD-10-CM | POA: Diagnosis not present

## 2024-03-06 NOTE — Progress Notes (Deleted)
   [address] [place] [address] Dept: [phone]  FOLLOW UP NOTE  Patient ID: Molly [name], female    DOB: 13-Feb-2001  Age: 23 y.o. MRN: [id number] Date of Office Visit: 03/07/2024  Assessment  Chief Complaint: No chief complaint on file.  HPI Molly [name] is a 23 year old female who presents to clinic for follow-up visit.  She was last seen in this clinic on 09/07/2023 by Dr. Dellis Anes for evaluation of asthma, allergic rhinitis, recurrent infection, snoring, and food allergy to fish and blueberry.  Her last environmental allergy testing via lab on 08/17/2022 was positive to dust mite.  Her last food allergy testing for fish via lab was on 04/18/2017 and was negative to the [facility] panel.  Lab testing to blueberry on 11/26/2019 was negative. Immune screening in 08/12/2021 indicated 5 out of 23 protective pneumococcal titers with follow-up testing on 04/21/2022 indicated 21 out of 23 protective pneumococcal titers.  Discussed the use of AI scribe software for clinical note transcription with the patient, who gave verbal consent to proceed.  History of Present Illness      Drug Allergies:  Allergies  Allergen Reactions   Brompheniramine Swelling and Anxiety   Glucosamine Forte [Nutritional Supplements] Anaphylaxis    Due to containing shellfish   Nutritional Supplements Anaphylaxis   Oyster Extract Anaphylaxis   Shellfish-Derived Products Anaphylaxis    Throat swelling   Cats Claw (Uncaria Tomentosa) Nausea And Vomiting   Diamox [Acetazolamide] Other (See Comments)    Numbness- feet, hands, lips   Lactose    Lactose Intolerance (Gi)    Topiramate Other (See Comments)    Numbness to face, hands and feet   Bromfed Dm [Pseudoeph-Bromphen-Dm] Anxiety    CARBOFED DM    Physical Exam: There were no vitals taken for this visit.   Physical Exam  Diagnostics:    Assessment and Plan: No diagnosis found.  No orders of the defined types were  placed in this encounter.   There are no Patient Instructions on file for this visit.  No follow-ups on file.    Thank you for the opportunity to care for this patient.  Please do not hesitate to contact me with questions.  Thermon Leyland, FNP [facility]

## 2024-03-07 ENCOUNTER — Ambulatory Visit: Payer: MEDICAID | Admitting: Family Medicine

## 2024-03-07 ENCOUNTER — Encounter: Payer: Self-pay | Admitting: Allergy & Immunology

## 2024-03-08 LAB — IGE MILK W/ COMPONENT REFLEX: F002-IgE Milk: <0.1 kU/L

## 2024-03-12 ENCOUNTER — Encounter: Payer: Self-pay | Admitting: Allergy & Immunology

## 2024-03-14 ENCOUNTER — Ambulatory Visit: Payer: MEDICAID | Admitting: Allergy & Immunology

## 2024-03-25 ENCOUNTER — Encounter (HOSPITAL_BASED_OUTPATIENT_CLINIC_OR_DEPARTMENT_OTHER): Payer: Self-pay

## 2024-03-25 NOTE — Telephone Encounter (Signed)
 Please review and advise.

## 2024-03-31 ENCOUNTER — Encounter: Payer: Self-pay | Admitting: Allergy & Immunology

## 2024-04-02 DIAGNOSIS — J4550 Severe persistent asthma, uncomplicated: Secondary | ICD-10-CM | POA: Diagnosis not present

## 2024-04-04 ENCOUNTER — Encounter: Payer: Self-pay | Admitting: Family Medicine

## 2024-04-04 ENCOUNTER — Ambulatory Visit (INDEPENDENT_AMBULATORY_CARE_PROVIDER_SITE_OTHER): Payer: MEDICAID | Admitting: Family Medicine

## 2024-04-04 VITALS — BP 98/60 | HR 92 | Temp 98.4°F | Ht 59.0 in | Wt 225.0 lb

## 2024-04-04 DIAGNOSIS — H938X3 Other specified disorders of ear, bilateral: Secondary | ICD-10-CM

## 2024-04-04 MED ORDER — METHYLPREDNISOLONE ACETATE 80 MG/ML IJ SUSP
80.0000 mg | Freq: Once | INTRAMUSCULAR | Status: AC
  Administered 2024-04-04: 80 mg via INTRAMUSCULAR

## 2024-04-04 NOTE — Progress Notes (Signed)
 BP 98/60   Pulse 92   Temp 98.4 F (36.9 C)   Ht 4\' 11"  (1.499 m)   Wt 225 lb (102.1 kg)   SpO2 97%   BMI 45.44 kg/m    Subjective:   Patient ID: Molly [name], female    DOB: November 13, 2001, 22 y.o.   MRN: [id number]  HPI: Molly [name] is a 23 y.o. female presenting on 04/04/2024 for Ear Pain (Both ears. Started one week ago.)   HPI Ear pressure and pain and congestion Patient has been having ear pressure and pain and congestion in both of her ears, opercular left than right.  She says been going on for about a week and she has been using her allergy  medicines and antihistamines that she has at home.  She describes the pain as sharp and worse on the left than the right.  She says also when she gets in the shower the water seems to make it worse and she does feel like it pops frequently.  Relevant past medical, surgical, family and social history reviewed and updated as indicated. Interim medical history since our last visit reviewed. Allergies and medications reviewed and updated.  Review of Systems  Constitutional:  Negative for chills and fever.  HENT:  Positive for congestion and ear pain. Negative for ear discharge, facial swelling, postnasal drip, rhinorrhea, sinus pressure, sneezing and sore throat.   Eyes:  Negative for pain, redness and visual disturbance.  Respiratory:  Negative for chest tightness and shortness of breath.   Cardiovascular:  Negative for chest pain and leg swelling.  Genitourinary:  Negative for difficulty urinating and dysuria.  Musculoskeletal:  Negative for back pain and gait problem.  Skin:  Negative for rash.  Neurological:  Negative for light-headedness and headaches.  Psychiatric/Behavioral:  Negative for agitation and behavioral problems.   All other systems reviewed and are negative.   Per HPI unless specifically indicated above   Allergies as of 04/04/2024       Reactions   Brompheniramine Swelling, Anxiety   Glucosamine  Forte [nutritional Supplements] Anaphylaxis   Due to containing shellfish   Nutritional Supplements Anaphylaxis   Oyster Extract Anaphylaxis   Shellfish-derived Products Anaphylaxis   Throat swelling   Cats Claw (uncaria Tomentosa) Nausea And Vomiting   Diamox [acetazolamide] Other (See Comments)   Numbness- feet, hands, lips   Lactose    Lactose Intolerance (gi)    Topiramate Other (See Comments)   Numbness to face, hands and feet   Bromfed Dm [pseudoeph-bromphen-dm] Anxiety   CARBOFED DM        Medication List        Accurate as of Apr 04, 2024  2:39 PM. If you have any questions, ask your nurse or doctor.          albuterol  (2.5 MG/3ML) 0.083% nebulizer solution Commonly known as: PROVENTIL  Take 3 mLs (2.5 mg total) by nebulization every 6 (six) hours as needed for wheezing or shortness of breath.   Ventolin  HFA 108 (90 Base) MCG/ACT inhaler Generic drug: albuterol  INHALE 2 PUFFS BY MOUTH EVERY 4 HOURS AS NEEDED FOR WHEEZE OR FOR SHORTNESS OF BREATH   ascorbic acid  500 MG tablet Commonly known as: VITAMIN C  Take 500 mg by mouth daily.   atomoxetine  60 MG capsule Commonly known as: STRATTERA  Take 1 capsule (60 mg total) by mouth daily.   Azelastine -Fluticasone  137-50 MCG/ACT Susp Place 1 spray into both nostrils 2 (two) times daily.   baclofen 10 MG tablet  Commonly known as: LIORESAL Take 10 mg by mouth 3 (three) times daily.   bisoprolol  5 MG tablet Commonly known as: ZEBETA  Take 1.5 tablets (7.5 mg total) by mouth daily. What changed: how much to take   budesonide -formoterol  160-4.5 MCG/ACT inhaler Commonly known as: Symbicort  Inhale 1 puff into the lungs daily.   busPIRone  10 MG tablet Commonly known as: BUSPAR  Take 1 tablet (10 mg total) by mouth 2 (two) times daily.   calcium  carbonate 1250 (500 Ca) MG chewable tablet Commonly known as: OS-CAL Chew by mouth.   cariprazine  1.5 MG capsule Commonly known as: Vraylar  Take 1 capsule (1.5 mg  total) by mouth daily.   Cholecalciferol 25 MCG (1000 UT) tablet Take 1 Units by mouth daily at 2 PM.   CLARITIN  PO Take by mouth.   clobetasol  0.05 % external solution Commonly known as: TEMOVATE  Apply topically 2 (two) times daily as needed.   clonazePAM  0.5 MG tablet Commonly known as: KlonoPIN  Take 1 tablet (0.5 mg total) by mouth 2 (two) times daily as needed for anxiety.   dicyclomine 10 MG capsule Commonly known as: BENTYL Take 10 mg by mouth as needed for spasms.   EpiPen  2-Pak 0.3 MG/0.3ML Soaj injection Generic drug: EPINEPHrine  INJECT 0.3 MG INTO THE MUSCLE AS NEEDED FOR ANAPHYLAXIS.   fexofenadine  180 MG tablet Commonly known as: ALLEGRA  TAKE 1 TABLET BY MOUTH EVERY DAY   Galcanezumab-gnlm 120 MG/ML Soaj Inject into the skin. Emgality   hydrOXYzine  25 MG capsule Commonly known as: VISTARIL  Take 1-2 capsules (25-50 mg total) by mouth daily as needed.   levothyroxine  50 MCG tablet Commonly known as: SYNTHROID  Take 1 tablet (50 mcg total) by mouth daily before breakfast.   Linzess 72 MCG capsule Generic drug: linaclotide Take 72 mcg by mouth daily.   metoprolol  tartrate 25 MG tablet Commonly known as: LOPRESSOR  TAKE 1 TABLET (25 MG TOTAL) BY MOUTH AS NEEDED.   MIRALAX  PO Take by mouth as needed.   montelukast  10 MG tablet Commonly known as: SINGULAIR  Take 1 tablet (10 mg total) by mouth daily.   multivitamin capsule Take by mouth.   norethindrone  0.35 MG tablet Commonly known as: MICRONOR  TAKE 1 TABLET BY MOUTH EVERY DAY   Olopatadine  HCl 0.2 % Soln Apply 1 drop to eye every morning.   ondansetron 4 MG disintegrating tablet Commonly known as: ZOFRAN-ODT DISSOLVE ON TONGUE AT ONSET OF NAUSEA- 30 DAY SUPPLY What changed: See the new instructions.   pantoprazole  40 MG tablet Commonly known as: PROTONIX  Take 1 tablet by mouth in the morning and at bedtime.   Restasis 0.05 % ophthalmic emulsion Generic drug: cycloSPORINE 1 drop 2 (two)  times daily.   RIZATRIPTAN BENZOATE PO Take by mouth.   sertraline  50 MG tablet Commonly known as: ZOLOFT  Take 1.5 tablets (75 mg total) by mouth daily.   topiramate 50 MG tablet Commonly known as: TOPAMAX Take 100 mg by mouth 2 (two) times daily.         Objective:   BP 98/60   Pulse 92   Temp 98.4 F (36.9 C)   Ht 4\' 11"  (1.499 m)   Wt 225 lb (102.1 kg)   SpO2 97%   BMI 45.44 kg/m   Wt Readings from Last 3 Encounters:  04/04/24 225 lb (102.1 kg)  02/13/24 227 lb 6.4 oz (103.1 kg)  01/18/24 229 lb (103.9 kg)    Physical Exam Vitals and nursing note reviewed.  Constitutional:      General: She  is not in acute distress.    Appearance: She is well-developed. She is not diaphoretic.  HENT:     Right Ear: Ear canal normal. Tympanic membrane is bulging. Tympanic membrane is not injected or erythematous.     Left Ear: Ear canal normal. Tympanic membrane is bulging. Tympanic membrane is not injected or erythematous.  Eyes:     Conjunctiva/sclera: Conjunctivae normal.     Pupils: Pupils are equal, round, and reactive to light.  Cardiovascular:     Rate and Rhythm: Normal rate and regular rhythm.     Heart sounds: Normal heart sounds. No murmur heard. Pulmonary:     Effort: Pulmonary effort is normal. No respiratory distress.     Breath sounds: Normal breath sounds. No wheezing.  Musculoskeletal:        General: No tenderness. Normal range of motion.  Skin:    General: Skin is warm and dry.     Findings: No rash.  Neurological:     Mental Status: She is alert and oriented to person, place, and time.     Coordination: Coordination normal.  Psychiatric:        Behavior: Behavior normal.       Assessment & Plan:   Problem List Items Addressed This Visit   None Visit Diagnoses       Congestion of both ears    -  Primary   Relevant Medications   methylPREDNISolone acetate (DEPO-MEDROL) injection 80 mg (Start on 04/04/2024  2:45 PM)       Patient having  ear congestion, likely due to to chronic allergies.  Will give a steroid injection.  Already taking antihistamines.  Also recommend that she continue Benadryl and Flonase . Follow up plan: No follow-ups on file.  Counseling provided for all of the vaccine components No orders of the defined types were placed in this encounter.   Jolyne Needs, MD [facility] 04/04/2024, 2:39 PM

## 2024-04-09 ENCOUNTER — Encounter: Payer: Self-pay | Admitting: Allergy & Immunology

## 2024-04-13 ENCOUNTER — Other Ambulatory Visit: Payer: Self-pay | Admitting: Allergy & Immunology

## 2024-04-13 ENCOUNTER — Other Ambulatory Visit: Payer: Self-pay | Admitting: Internal Medicine

## 2024-04-20 ENCOUNTER — Other Ambulatory Visit: Payer: Self-pay | Admitting: Internal Medicine

## 2024-05-01 NOTE — Progress Notes (Unsigned)
   [address] [place] [address] Dept: [phone]  FOLLOW UP NOTE  Patient ID: Molly [name], female    DOB: [birth year]  Age: 23 y.o. MRN: [id number] Date of Office Visit: 05/02/2024  Assessment  Chief Complaint: No chief complaint on file.  HPI Molly [name] is a 23 year old female who presents to the clinic for follow-up visit.  She was last seen in this clinic on 09/07/2023 by Dr. 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  Her last environmental allergy  skin testing was on 10/09/2017 and was positive to indoor mold, outdoor mold, dust mite, and cat.  Discussed the use of AI scribe software for clinical note transcription with the patient, who gave verbal consent to proceed.  History of Present Illness      Drug Allergies:  Allergies  Allergen Reactions   Brompheniramine Swelling and Anxiety   Glucosamine Forte [Nutritional Supplements] Anaphylaxis    Due to containing shellfish   Nutritional Supplements Anaphylaxis   Oyster Extract Anaphylaxis   Shellfish-Derived Products Anaphylaxis    Throat swelling   Cats Claw (Uncaria Tomentosa) Nausea And Vomiting   Diamox [Acetazolamide] Other (See Comments)    Numbness- feet, hands, lips   Lactose    Lactose Intolerance (Gi)    Topiramate Other (See Comments)    Numbness to face, hands and feet   Bromfed Dm [Pseudoeph-Bromphen-Dm] Anxiety    CARBOFED DM    Physical Exam: There were no vitals taken for this visit.   Physical Exam  Diagnostics:    Assessment and Plan: No diagnosis found.  No orders of the defined types were placed in this encounter.   There are no Patient Instructions on file for this visit.  No follow-ups on file.    Thank you for the opportunity to care for this patient.  Please do not hesitate to contact me with questions.  Marinus Sic, FNP [facility]

## 2024-05-01 NOTE — Patient Instructions (Incomplete)
 1. Intrinsic atopic dermatitis  - Continue with the triamcinolone  ointment as needed.  - Continue with moisturizing twice daily.   2. Moderate persistent asthma, uncomplicated - Lung testing looked AMAZING today!  - We will change to the SMART therapy. - Daily controller medication(s): Symbicort  160/4.5 mcg one puff once daily - Rescue medications: Symbicort  160/4.29mcg two puffs every 4-6 hours as needed - Asthma control goals:  * Full participation in all desired activities (may need albuterol  before activity) * Albuterol  use two time or less a week on average (not counting use with activity) * Cough interfering with sleep two time or less a month * Oral steroids no more than once a year * No hospitalizations  3. Chronic allergic rhinitis (indoor molds, outdoor molds, dust mites and cat) - Your nose looks great today!  - Continue with: Singulair  (montelukast ) 10mg  daily and Dymista  (fluticasone /azelastine ) two sprays per nostril 1-2 times daily as needed  - Continue with: Allegra  and Claritin  as you are doing.  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  5. Recurrent infections Keep track of infections, antibiotic use and steroid use Consider referral to ENT for frequent ear infections We may consider lab work to screen your immune system at some point   Call the clinic if this treatment plan is not working well for you  Follow up in 6 months or sooner if needed.

## 2024-05-02 ENCOUNTER — Other Ambulatory Visit: Payer: Self-pay

## 2024-05-02 ENCOUNTER — Encounter: Payer: Self-pay | Admitting: Family Medicine

## 2024-05-02 ENCOUNTER — Ambulatory Visit: Payer: MEDICAID | Admitting: Family Medicine

## 2024-05-02 VITALS — BP 108/86 | HR 104 | Temp 97.9°F | Resp 18 | Ht 59.06 in | Wt 230.1 lb

## 2024-05-02 DIAGNOSIS — T7800XD Anaphylactic reaction due to unspecified food, subsequent encounter: Secondary | ICD-10-CM | POA: Diagnosis not present

## 2024-05-02 DIAGNOSIS — J3089 Other allergic rhinitis: Secondary | ICD-10-CM | POA: Diagnosis not present

## 2024-05-02 DIAGNOSIS — L2084 Intrinsic (allergic) eczema: Secondary | ICD-10-CM | POA: Diagnosis not present

## 2024-05-02 DIAGNOSIS — J302 Other seasonal allergic rhinitis: Secondary | ICD-10-CM

## 2024-05-02 DIAGNOSIS — J4540 Moderate persistent asthma, uncomplicated: Secondary | ICD-10-CM | POA: Diagnosis not present

## 2024-05-02 DIAGNOSIS — B999 Unspecified infectious disease: Secondary | ICD-10-CM

## 2024-05-02 MED ORDER — EPINEPHRINE 0.3 MG/0.3ML IJ SOAJ: 0.3000 mg | INTRAMUSCULAR | 2 refills | Status: DC | PRN

## 2024-05-02 NOTE — Addendum Note (Signed)
 Addended by: Mollie Anger on: 05/02/2024 03:53 PM   Modules accepted: Orders

## 2024-05-03 DIAGNOSIS — J4550 Severe persistent asthma, uncomplicated: Secondary | ICD-10-CM | POA: Diagnosis not present

## 2024-05-06 ENCOUNTER — Other Ambulatory Visit: Payer: Self-pay | Admitting: Allergy & Immunology

## 2024-05-09 ENCOUNTER — Telehealth (HOSPITAL_COMMUNITY): Payer: MEDICAID | Admitting: Psychiatry

## 2024-05-09 ENCOUNTER — Encounter (HOSPITAL_COMMUNITY): Payer: Self-pay | Admitting: Psychiatry

## 2024-05-09 DIAGNOSIS — F902 Attention-deficit hyperactivity disorder, combined type: Secondary | ICD-10-CM

## 2024-05-09 DIAGNOSIS — F333 Major depressive disorder, recurrent, severe with psychotic symptoms: Secondary | ICD-10-CM | POA: Diagnosis not present

## 2024-05-09 MED ORDER — BUSPIRONE HCL 10 MG PO TABS: 10.0000 mg | ORAL_TABLET | Freq: Two times a day (BID) | ORAL | 2 refills | Status: DC

## 2024-05-09 MED ORDER — CARIPRAZINE HCL 1.5 MG PO CAPS: 1.5000 mg | ORAL_CAPSULE | Freq: Every day | ORAL | 2 refills | Status: DC

## 2024-05-09 MED ORDER — SERTRALINE HCL 50 MG PO TABS: 75.0000 mg | ORAL_TABLET | Freq: Every day | ORAL | 1 refills | Status: DC

## 2024-05-09 MED ORDER — ATOMOXETINE HCL 60 MG PO CAPS: 60.0000 mg | ORAL_CAPSULE | Freq: Every day | ORAL | 1 refills | Status: DC

## 2024-05-09 MED ORDER — HYDROXYZINE PAMOATE 25 MG PO CAPS: 25.0000 mg | ORAL_CAPSULE | Freq: Every day | ORAL | 1 refills | Status: DC | PRN

## 2024-05-09 NOTE — Progress Notes (Signed)
 Virtual Visit via Video Note  I connected with Molly [name] on 05/09/24 at 11:20 AM EDT by a video enabled telemedicine application and verified that I am speaking with the correct person using two identifiers.  Location: Patient: home Provider: office   I discussed the limitations of evaluation and management by telemedicine and the availability of in person appointments. The patient expressed understanding and agreed to proceed.    I discussed the assessment and treatment plan with the patient. The patient was provided an opportunity to ask questions and all were answered. The patient agreed with the plan and demonstrated an understanding of the instructions.   The patient was advised to call back or seek an in-person evaluation if the symptoms worsen or if the condition fails to improve as anticipated.  I provided 20 minutes of non-face-to-face time during this encounter.   [name] Annas, MD  [facility] MD/PA/NP OP Progress Note  05/09/2024 11:40 AM Molly [name]  MRN:  [id number]  Chief Complaint:  Chief Complaint  Patient presents with   Anxiety   Depression   Follow-up   ADD   HPI:  This patient is a 23 year old female lives with her mother mother's fianc and a younger brother in [place]. 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   The patient returns for follow-up after 3 months regarding her depression anxiety and ADD.  For the most part she has been doing very well.  She really enjoys her job.  She is going to be working longer hours the summer and is a little bit worried about her POTS.  She has had a few episodes of getting lightheaded but she knows that she has to stay hydrated and add electrolytes to her drinking water.  She still is dealing with migraine headaches at times with otherwise her health has been stable.  She states that her mood is good and she denies significant depression or  anxiety.  She has not had any further panic attacks.  She is no longer using the clonazepam .  She still uses BuSpar  and occasionally hydroxyzine .  She is generally sleeping well and her energy is good Visit Diagnosis:    ICD-10-CM   1. Attention deficit hyperactivity disorder (ADHD), combined type  F90.2     2. Severe episode of recurrent major depressive disorder, with psychotic features (HCC)  F33.3       Past Psychiatric History: Past psychiatric admissions at the younger age, 2 years of therapy [facility]  Past Medical History:  Past Medical History:  Diagnosis Date   Acid reflux 2010   Acne vulgaris 2017   ADHD (attention deficit hyperactivity disorder) 2011   Adverse food reaction 07/11/2016   Shellfish allergy    Amenorrhea 2019   Anaphylactic shock due to adverse food reaction 01/15/2018   Anxiety 2017   ASD (atrial septal defect) 11/22/2022   Chronic constipation 09/05/2011   Chronic tension headaches 2011   originally followed by [facility] K.Griffin. Then Dr Cathaleen Clinton [facility] 03/2020 (see above)    Coalition, calcaneus navicular    Dysmenorrhea    Eczema    Eustachian tube dysfunction, left 10/23/2019   Frequent headaches    Galactorrhea    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 04/01/2019   Hiatal hernia    History of atrial septal defect repair 12/04/2011   Hyperlipidemia 2013   Hypertension 2011   Hypertension 09/28/2020   hypertension, cellulitus  Idiopathic urticaria 09/05/2011   Insomnia    Keratosis pilaris 01/15/2018   Mass of left thigh 11/22/2017   Overview:  Added automatically from request for surgery [id number]   MDD (major depressive disorder), severe (HCC) 03/06/2018   Migraine 2011   re-diagnosed as chronic migraines 03/2020 [facility] - starting on preventative    Obesity without serious comorbidity with body mass index (BMI) in 95th to 98th percentile for age in pediatric patient 09/04/2018   PCOS (polycystic ovarian syndrome)    Piriformis  syndrome    POTS (postural orthostatic tachycardia syndrome)    PTSD (post-traumatic stress disorder)    Scoliosis 2019   Seasonal and perennial allergic rhinitis 2010   Severe persistent asthma without complication 2010   Sleep paralysis    Suicidal ideation 03/07/2018   Thyroid  disease    underactive thyroid    Tic disorder 2019    Past Surgical History:  Procedure Laterality Date   ASD REPAIR  2012   with Helix, via Cardiac Catheterization   atrial septal defecgt     CARDIAC SURGERY     DENTAL SURGERY  2020   lipoma removal  2019   LUMBAR PUNCTURE  05/20/2021   TONSILLECTOMY AND ADENOIDECTOMY  2010    Family Psychiatric History: See below  Family History:  Family History  Problem Relation Age of Onset   Hypertension Mother    Hyperlipidemia Mother    Diabetes Mother    Depression Mother    Arthritis Mother    Allergic rhinitis Mother    Asthma Mother    Bipolar disorder Mother    Anxiety disorder Mother    Mental illness Mother    Miscarriages / [place] Mother    Thyroid  disease Mother    Bipolar disorder Father    Drug abuse Father    Alcohol  abuse Father    ADD / ADHD Father    Mental illness Father    Allergic rhinitis Brother    Asthma Brother    ADD / ADHD Brother    Learning disabilities Brother    Hypertension Maternal Grandmother    Hyperlipidemia Maternal Grandmother    Heart disease Maternal Grandmother    Heart attack Maternal Grandfather    Bipolar disorder Maternal Grandfather    Angioedema Neg Hx    Eczema Neg Hx    Immunodeficiency Neg Hx    Urticaria Neg Hx     Social History:  Social History   Socioeconomic History   Marital status: Single    Spouse name: Not on file   Number of children: Not on file   Years of education: Not on file   Highest education level: Not on file  Occupational History   Not on file  Tobacco Use   Smoking status: Never   Smokeless tobacco: Never  Vaping Use   Vaping status: Never Used   Substance and Sexual Activity   Alcohol  use: No    Alcohol /week: 0.0 standard drinks of alcohol    Drug use: No   Sexual activity: Never    Birth control/protection: Pill  Other Topics Concern   Not on file  Social History Narrative   Not on file   Social Drivers of Health   Financial Resource Strain: Low Risk  (03/06/2023)   Overall Financial Resource Strain (CARDIA)    Difficulty of Paying Living Expenses: Not hard at all  Food Insecurity: No Food Insecurity (03/04/2024)   Received from [facility]   Hunger Vital Sign  Worried About Programme researcher, broadcasting/film/video in the Last Year: Never true    Ran Out of Food in the Last Year: Never true  Transportation Needs: No Transportation Needs (03/04/2024)   Received from [facility] - Transportation    Lack of Transportation (Medical): No    Lack of Transportation (Non-Medical): No  Physical Activity: Inactive (03/06/2023)   Exercise Vital Sign    Days of Exercise per Week: 0 days    Minutes of Exercise per Session: 0 min  Stress: No Stress Concern Present (03/06/2023)   [employer] of Occupational Health - Occupational Stress Questionnaire    Feeling of Stress : Only a little  Social Connections: Socially Isolated (03/06/2023)   Social Connection and Isolation Panel [NHANES]    Frequency of Communication with Friends and Family: Three times a week    Frequency of Social Gatherings with Friends and Family: Once a week    Attends Religious Services: Never    Database administrator or Organizations: No    Attends Banker Meetings: Never    Marital Status: Never married    Allergies:  Allergies  Allergen Reactions   Brompheniramine Swelling and Anxiety   Glucosamine Forte [Nutritional Supplements] Anaphylaxis    Due to containing shellfish   Nutritional Supplements Anaphylaxis   Oyster Extract Anaphylaxis   Shellfish-Derived Products Anaphylaxis    Throat swelling   Cats Claw (Uncaria Tomentosa) Nausea And  Vomiting   Diamox [Acetazolamide] Other (See Comments)    Numbness- feet, hands, lips   Lactose    Lactose Intolerance (Gi)    Topiramate Other (See Comments)    Numbness to face, hands and feet   Bromfed Dm [Pseudoeph-Bromphen-Dm] Anxiety    CARBOFED DM    Metabolic Disorder Labs: Lab Results  Component Value Date   HGBA1C 5.1 10/11/2023   MPG 105.41 03/07/2018   Lab Results  Component Value Date   PROLACTIN 49.7 (H) 03/07/2018   Lab Results  Component Value Date   CHOL 198 02/08/2024   TRIG 151 (H) 02/08/2024   HDL 47 02/08/2024   CHOLHDL 4.2 02/08/2024   VLDL 20 03/07/2018   LDLCALC 124 (H) 02/08/2024   LDLCALC 113 (H) 05/29/2023   Lab Results  Component Value Date   TSH 2.200 02/08/2024   TSH 3.730 10/05/2023    Therapeutic Level Labs: No results found for: "LITHIUM" No results found for: "VALPROATE" No results found for: "CBMZ"  Current Medications: Current Outpatient Medications  Medication Sig Dispense Refill   albuterol  (PROVENTIL ) (2.5 MG/3ML) 0.083% nebulizer solution Take 3 mLs (2.5 mg total) by nebulization every 6 (six) hours as needed for wheezing or shortness of breath. 75 mL 1   ascorbic acid  (VITAMIN C ) 500 MG tablet Take 500 mg by mouth daily.     atomoxetine  (STRATTERA ) 60 MG capsule Take 1 capsule (60 mg total) by mouth daily. 90 capsule 1   Azelastine -Fluticasone  (DYMISTA ) 137-50 MCG/ACT SUSP PLACE 1 SPRAY INTO BOTH NOSTRILS 2 (TWO) TIMES DAILY 23 g 5   baclofen (LIORESAL) 10 MG tablet Take 10 mg by mouth 3 (three) times daily.     bisoprolol  (ZEBETA ) 5 MG tablet TAKE 1.5 TABLETS BY MOUTH DAILY. 135 tablet 2   budesonide -formoterol  (SYMBICORT ) 160-4.5 MCG/ACT inhaler Inhale 1 puff into the lungs daily. 10.2 g 5   busPIRone  (BUSPAR ) 10 MG tablet Take 1 tablet (10 mg total) by mouth 2 (two) times daily. 180 tablet 2   calcium  carbonate (OS-CAL)  1250 (500 Ca) MG chewable tablet Chew by mouth.     cariprazine  (VRAYLAR ) 1.5 MG capsule Take 1  capsule (1.5 mg total) by mouth daily. 30 capsule 2   Cholecalciferol 25 MCG (1000 UT) tablet Take 1 Units by mouth daily at 2 PM.     clobetasol  (TEMOVATE ) 0.05 % external solution Apply topically 2 (two) times daily as needed. 50 mL 3   clonazePAM  (KLONOPIN ) 0.5 MG tablet Take 1 tablet (0.5 mg total) by mouth 2 (two) times daily as needed for anxiety. 60 tablet 2   dicyclomine (BENTYL) 10 MG capsule Take 10 mg by mouth as needed for spasms.     EPINEPHrine  (EPIPEN  2-PAK) 0.3 mg/0.3 mL IJ SOAJ injection Inject 0.3 mg into the muscle as needed for anaphylaxis. 1 each 2   fexofenadine  (ALLEGRA ) 180 MG tablet TAKE 1 TABLET BY MOUTH EVERY DAY 90 tablet 2   Galcanezumab-gnlm 120 MG/ML SOAJ Inject into the skin. Emgality (Patient not taking: Reported on 05/02/2024)     hydrOXYzine  (VISTARIL ) 25 MG capsule Take 1-2 capsules (25-50 mg total) by mouth daily as needed. 180 capsule 1   levothyroxine  (SYNTHROID ) 50 MCG tablet Take 1 tablet (50 mcg total) by mouth daily before breakfast. 90 tablet 1   LINZESS 72 MCG capsule Take 72 mcg by mouth daily.     Loratadine  (CLARITIN  PO) Take by mouth.     metoprolol  tartrate (LOPRESSOR ) 25 MG tablet TAKE 1 TABLET (25 MG TOTAL) BY MOUTH AS NEEDED. 90 tablet 1   montelukast  (SINGULAIR ) 10 MG tablet TAKE 1 TABLET BY MOUTH EVERY DAY 90 tablet 1   Multiple Vitamin (MULTIVITAMIN) capsule Take by mouth.     norethindrone  (MICRONOR ) 0.35 MG tablet TAKE 1 TABLET BY MOUTH EVERY DAY 84 tablet 3   Olopatadine  HCl 0.2 % SOLN Apply 1 drop to eye every morning.     ondansetron (ZOFRAN-ODT) 4 MG disintegrating tablet DISSOLVE ON TONGUE AT ONSET OF NAUSEA- 30 DAY SUPPLY (Patient taking differently: Take 8 mg by mouth every 8 (eight) hours.) 10 tablet 1   pantoprazole  (PROTONIX ) 40 MG tablet Take 1 tablet by mouth in the morning and at bedtime.     Polyethylene Glycol 3350  (MIRALAX  PO) Take by mouth as needed.     RESTASIS 0.05 % ophthalmic emulsion 1 drop 2 (two) times daily.      RIZATRIPTAN BENZOATE PO Take by mouth.     sertraline  (ZOLOFT ) 50 MG tablet Take 1.5 tablets (75 mg total) by mouth daily. 135 tablet 1   topiramate (TOPAMAX) 50 MG tablet Take 100 mg by mouth 2 (two) times daily.     VENTOLIN  HFA 108 (90 Base) MCG/ACT inhaler INHALE 2 PUFFS BY MOUTH EVERY 4 HOURS AS NEEDED FOR WHEEZE OR FOR SHORTNESS OF BREATH 18 g 1   No current facility-administered medications for this visit.     Musculoskeletal: Strength & Muscle Tone: within normal limits Gait & Station: normal Patient leans: N/A  Psychiatric Specialty Exam: Review of Systems  Neurological:  Positive for light-headedness and headaches.  All other systems reviewed and are negative.   There were no vitals taken for this visit.There is no height or weight on file to calculate BMI.  General Appearance: Casual and Fairly Groomed  Eye Contact:  Good  Speech:  Clear and Coherent  Volume:  Normal  Mood:  Euthymic  Affect:  Congruent  Thought Process:  Goal Directed  Orientation:  Full (Time, Place, and Person)  Thought Content: WDL  Suicidal Thoughts:  No  Homicidal Thoughts:  No  Memory:  Immediate;   Good Recent;   Good Remote;   Fair  Judgement:  Good  Insight:  Good  Psychomotor Activity:  Normal  Concentration:  Concentration: Good and Attention Span: Good  Recall:  Good  Fund of Knowledge: Good  Language: Good  Akathisia:  No  Handed:  Right  AIMS (if indicated): not done  Assets:  Communication Skills Desire for Improvement Physical Health Resilience Social Support Talents/Skills  ADL's:  Intact  Cognition: WNL  Sleep:  Good   Screenings: GAD-7    Flowsheet Row Office Visit from 10/29/2023 in [facility] Office Visit from 09/14/2023 in [facility] Office Visit from 08/01/2023 in [facility] Office Visit from 05/18/2023 in [facility]  Office Visit from 05/02/2023 in [facility]  Total GAD-7 Score 0 0 1 0 0      PHQ2-9    Flowsheet Row Office Visit from 04/04/2024 in [facility] Office Visit from 10/29/2023 in [facility] Office Visit from 09/14/2023 in [facility] Office Visit from 08/01/2023 in [facility] Office Visit from 05/18/2023 in [facility]  PHQ-2 Total Score 0 0 0 0 0  PHQ-9 Total Score -- 1 0 2 1      Flowsheet Row Video Visit from 08/22/2022 in [facility] [place] Video Visit from 06/23/2022 in [facility] [employer] Video Visit from 04/19/2022 in [facility]  C-SSRS RISK CATEGORY No Risk No Risk No Risk        Assessment and Plan: This patient is a 23 year old female with a history of depression anxiety mood swings sleep difficulties fibromyalgia chronic fatigue POTS and ADD.  She does feel like her depression anxiety and focus are well-controlled.  She will continue Strattera  60 mg daily for ADD, Vraylar  1.5 mg daily for mood stabilization, Zoloft  75 mg daily for depression and BuSpar  10 mg twice daily for anxiety.  She occasionally uses hydroxyzine  10 mg for anxiety or headaches.  She will return to see me in 3 months  Collaboration of Care: Collaboration of Care: Primary Care Provider AEB notes are shared with PCP on the epic system  Patient/Guardian was advised Release of Information must be obtained prior to any record release in order to collaborate their care with an outside provider. Patient/Guardian was advised if they have not already done so to contact the registration department to sign all necessary forms in order for us  to release information regarding their care.   Consent:  Patient/Guardian gives verbal consent for treatment and assignment of benefits for services provided during this visit. Patient/Guardian expressed understanding and agreed to proceed.    [name] Annas, MD 05/09/2024, 11:40 AM

## 2024-05-12 ENCOUNTER — Encounter: Payer: Self-pay | Admitting: Allergy & Immunology

## 2024-05-13 ENCOUNTER — Ambulatory Visit: Payer: Self-pay | Admitting: Family Medicine

## 2024-05-13 ENCOUNTER — Encounter: Payer: Self-pay | Admitting: Family Medicine

## 2024-05-13 ENCOUNTER — Ambulatory Visit: Payer: MEDICAID | Admitting: Family Medicine

## 2024-05-13 ENCOUNTER — Encounter: Payer: MEDICAID | Admitting: Family Medicine

## 2024-05-13 ENCOUNTER — Other Ambulatory Visit: Payer: Self-pay | Admitting: Family Medicine

## 2024-05-13 VITALS — BP 132/86 | HR 87 | Temp 97.8°F | Ht 59.06 in | Wt 229.8 lb

## 2024-05-13 DIAGNOSIS — Z6841 Body Mass Index (BMI) 40.0 and over, adult: Secondary | ICD-10-CM

## 2024-05-13 DIAGNOSIS — E538 Deficiency of other specified B group vitamins: Secondary | ICD-10-CM

## 2024-05-13 DIAGNOSIS — R252 Cramp and spasm: Secondary | ICD-10-CM

## 2024-05-13 DIAGNOSIS — I10 Essential (primary) hypertension: Secondary | ICD-10-CM

## 2024-05-13 DIAGNOSIS — J301 Allergic rhinitis due to pollen: Secondary | ICD-10-CM

## 2024-05-13 DIAGNOSIS — Z0001 Encounter for general adult medical examination with abnormal findings: Secondary | ICD-10-CM

## 2024-05-13 DIAGNOSIS — Z0000 Encounter for general adult medical examination without abnormal findings: Secondary | ICD-10-CM

## 2024-05-13 LAB — LIPID PANEL

## 2024-05-13 LAB — BAYER DCA HB A1C WAIVED: HB A1C (BAYER DCA - WAIVED): 5 % (ref 4.8–5.6)

## 2024-05-13 NOTE — Progress Notes (Signed)
 Complete physical exam  Patient: Molly [name]   DOB: 03-29-2001   22 y.o. Female  MRN: [id number]  Subjective:     Chief Complaint  Patient presents with   Annual Exam    Molly [name] is a 23 y.o. female who presents today for a complete physical exam. She reports consuming a general diet. The patient does not participate in regular exercise at present. She generally feels well. She reports sleeping fairly well. She does have additional problems to discuss today.   [name] is a 23 year old female who presents for an annual physical exam.  She has a history of asthma and is contemplating the pneumonia vaccine. She is concerned about potential side effects, as she experienced flu-like symptoms after her last COVID vaccine. 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  She experiences irregular menstrual cycles, with her last period ending two days ago. She is not sexually active, and STI screenings are not currently needed. She plans to begin PAP smears at age 38 and is already under the care of a gynecologist for her reproductive health needs.  She has a history of thyroid  issues and is followed by an endocrinologist. She experiences occasional sleep disturbances and sometimes uses Klonopin  to aid sleep, though she avoids it if she needs to wake up early due to next-day drowsiness. She has tried melatonin but found it caused excessive sleepiness the following day.  She has a history of weight fluctuations. In May of last year, she weighed 255 pounds, then dropped to 220 pounds. Currently, she weighs 229 pounds and feels 'stuck' in her weight loss journey. Her neurologist has advised weight loss for her idiopathic intracranial hypertension (IIH).  She does not smoke, drink alcohol , or use street drugs. She reports no recent changes in hearing or vision, although  she has been referred to a specialist eye doctor by her neurologist for further evaluation due to her eye age. She wears glasses but not consistently.     She has palpitations, muscle cramps, and general malaise on a regular basis.   Most recent fall risk assessment:    05/13/2024    2:31 PM  Fall Risk   Falls in the past year? 1  Number falls in past yr: 0  Injury with Fall? 1  Risk for fall due to : History of fall(s)  Follow up Falls evaluation completed     Most recent depression screenings:    05/13/2024    2:31 PM 04/04/2024    2:26 PM  PHQ 2/9 Scores  PHQ - 2 Score 0 0  PHQ- 9 Score 2     Vision:Not within last year  and Dental: No current dental problems and Receives regular dental care  Patient Active Problem List   Diagnosis Date Noted   Recurrent infections 05/02/2024   Moderate persistent asthma without complication 05/02/2024   Hypothyroidism 10/11/2023   Pelvic pain 09/12/2023   Bilateral low back pain with right-sided sciatica 09/12/2023   Prediabetes 05/10/2023   LLQ pain 03/06/2023   PCO (polycystic ovaries) 03/06/2023   Encounter for menstrual regulation 03/06/2023   ASD (atrial septal defect) 11/22/2022   Morbid obesity (HCC) 10/11/2022   B12 deficiency 10/11/2022   Hyperhidrosis 10/11/2022   Palpitations 10/02/2022   Piriformis syndrome 08/11/2021   OSA (obstructive sleep apnea) 06/10/2020   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  Insomnia 06/10/2020   Sleep paralysis 06/10/2020   Dysmenorrhea 03/23/2020   Menorrhagia with irregular cycle 03/23/2020   Fatigue 03/23/2020   ADHD (attention deficit hyperactivity disorder) 03/09/2020   Unexplained dysphagia 01/21/2020   Anxiety 10/23/2019   Eustachian tube dysfunction, left 10/23/2019   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 04/01/2019   Obesity without serious comorbidity with body mass index (BMI) in 95th to 98th percentile for age in pediatric patient 09/04/2018   PTSD (post-traumatic stress  disorder) 03/07/2018   MDD (major depressive disorder), severe (HCC) 03/06/2018   Keratosis pilaris 01/15/2018   Severe persistent asthma, uncomplicated 01/15/2018   Intrinsic atopic dermatitis 01/15/2018   Seasonal and perennial allergic rhinitis 01/15/2018   Anaphylactic shock due to adverse food reaction 01/15/2018   Tic disorder 2019   Scoliosis 2019   Adverse food reaction 07/11/2016   Chronic migraine w/o aura w/o status migrainosus, not intractable 05/07/2012   Hyperlipidemia 2013   History of atrial septal defect repair 12/04/2011   Idiopathic urticaria 09/05/2011   Chronic constipation 09/05/2011   Hypertension 2011   Past Medical History:  Diagnosis Date   Acid reflux 2010   Acne vulgaris 2017   ADHD (attention deficit hyperactivity disorder) 2011   Adverse food reaction 07/11/2016   Shellfish allergy    Amenorrhea 2019   Anaphylactic shock due to adverse food reaction 01/15/2018   Anxiety 2017   ASD (atrial septal defect) 11/22/2022   Chronic constipation 09/05/2011   Chronic tension headaches 2011   originally followed by [facility] K.Griffin. Then Dr Cathaleen [facility] 03/2020 (see above)    Coalition, calcaneus navicular    Dysmenorrhea    Eczema    Eustachian tube dysfunction, left 10/23/2019   Frequent headaches    Galactorrhea    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 04/01/2019   Hiatal hernia    History of atrial septal defect repair 12/04/2011   Hyperlipidemia 2013   Hypertension 2011   Hypertension 09/28/2020   hypertension, cellulitus    Idiopathic urticaria 09/05/2011   Insomnia    Keratosis pilaris 01/15/2018   Mass of left thigh 11/22/2017   Overview:  Added automatically from request for surgery [id number]   MDD (major depressive disorder), severe (HCC) 03/06/2018   Migraine 2011   re-diagnosed as chronic migraines 03/2020 [facility] - starting on preventative    Obesity without serious comorbidity with body mass index (BMI) in 95th to 98th percentile  for age in pediatric patient 09/04/2018   PCOS (polycystic ovarian syndrome)    Piriformis syndrome    POTS (postural orthostatic tachycardia syndrome)    PTSD (post-traumatic stress disorder)    Scoliosis 2019   Seasonal and perennial allergic rhinitis 2010   Severe persistent asthma without complication 2010   Sleep paralysis    Suicidal ideation 03/07/2018   Thyroid  disease    underactive thyroid    Tic disorder 2019   Past Surgical History:  Procedure Laterality Date   ASD REPAIR  2012   with Helix, via Cardiac Catheterization   atrial septal defecgt     CARDIAC SURGERY     DENTAL SURGERY  2020   lipoma removal  2019   LUMBAR PUNCTURE  05/20/2021   TONSILLECTOMY AND ADENOIDECTOMY  2010   Social History   Tobacco Use   Smoking status: Never   Smokeless tobacco: Never  Vaping Use   Vaping status: Never Used  Substance Use Topics   Alcohol  use: No    Alcohol /week: 0.0 standard drinks of alcohol    Drug  use: No   Social History   Socioeconomic History   Marital status: Single    Spouse name: Not on file   Number of children: Not on file   Years of education: Not on file   Highest education level: Not on file  Occupational History   Not on file  Tobacco Use   Smoking status: Never   Smokeless tobacco: Never  Vaping Use   Vaping status: Never Used  Substance and Sexual Activity   Alcohol  use: No    Alcohol /week: 0.0 standard drinks of alcohol    Drug use: No   Sexual activity: Never    Birth control/protection: Pill  Other Topics Concern   Not on file  Social History Narrative   Not on file   Social Drivers of Health   Financial Resource Strain: Low Risk  (03/06/2023)   Overall Financial Resource Strain (CARDIA)    Difficulty of Paying Living Expenses: Not hard at all  Food Insecurity: No Food Insecurity (03/04/2024)   Received from [facility]   Hunger Vital Sign    Worried About Running Out of Food in the Last Year: Never true    Ran Out of Food in  the Last Year: Never true  Transportation Needs: No Transportation Needs (03/04/2024)   Received from [facility]   PRAPARE - Transportation    Lack of Transportation (Medical): No    Lack of Transportation (Non-Medical): No  Physical Activity: Inactive (03/06/2023)   Exercise Vital Sign    Days of Exercise per Week: 0 days    Minutes of Exercise per Session: 0 min  Stress: No Stress Concern Present (03/06/2023)   [employer] of Occupational Health - Occupational Stress Questionnaire    Feeling of Stress : Only a little  Social Connections: Socially Isolated (03/06/2023)   Social Connection and Isolation Panel [NHANES]    Frequency of Communication with Friends and Family: Three times a week    Frequency of Social Gatherings with Friends and Family: Once a week    Attends Religious Services: Never    Database administrator or Organizations: No    Attends Banker Meetings: Never    Marital Status: Never married  Intimate Partner Violence: Not At Risk (03/06/2023)   Humiliation, Afraid, Rape, and Kick questionnaire    Fear of Current or Ex-Partner: No    Emotionally Abused: No    Physically Abused: No    Sexually Abused: No   Family Status  Relation Name Status   Mother  Alive   Father  Alive   Sister  Alive   Brother  Alive   MGM  Alive   MGF  Alive   PGM  Alive   PGF  Deceased   Neg Hx  (Not Specified)  No partnership data on file   Family History  Problem Relation Age of Onset   Hypertension Mother    Hyperlipidemia Mother    Diabetes Mother    Depression Mother    Arthritis Mother    Allergic rhinitis Mother    Asthma Mother    Bipolar disorder Mother    Anxiety disorder Mother    Mental illness Mother    Miscarriages / [place] Mother    Thyroid  disease Mother    Bipolar disorder Father    Drug abuse Father    Alcohol  abuse Father    ADD / ADHD Father    Mental illness Father    Allergic rhinitis Brother  Asthma Brother    ADD / ADHD  Brother    Learning disabilities Brother    Hypertension Maternal Grandmother    Hyperlipidemia Maternal Grandmother    Heart disease Maternal Grandmother    Heart attack Maternal Grandfather    Bipolar disorder Maternal Grandfather    Angioedema Neg Hx    Eczema Neg Hx    Immunodeficiency Neg Hx    Urticaria Neg Hx    Allergies  Allergen Reactions   Brompheniramine Swelling and Anxiety   Glucosamine Forte [Nutritional Supplements] Anaphylaxis    Due to containing shellfish   Nutritional Supplements Anaphylaxis   Oyster Extract Anaphylaxis   Shellfish-Derived Products Anaphylaxis    Throat swelling   Cats Claw (Uncaria Tomentosa) Nausea And Vomiting   Diamox [Acetazolamide] Other (See Comments)    Numbness- feet, hands, lips   Lactose    Lactose Intolerance (Gi)    Topiramate Other (See Comments)    Numbness to face, hands and feet   Bromfed Dm [Pseudoeph-Bromphen-Dm] Anxiety    CARBOFED DM      Patient Care Team: Galvin Jules, FNP as PCP - General (Family Medicine) Idolina Maker, Mirna Amis, MD as Consulting Physician (Allergy  and Immunology) Alysia Bachelor, MD as Consulting Physician (Behavioral Health) Alica Antu, MD as Consulting Physician (Neurology) Maron Siskin, Domenick Friedlander, MD as Consulting Physician (Pediatric Gastroenterology)   Outpatient Medications Prior to Visit  Medication Sig   AJOVY 225 MG/1.5ML SOAJ Inject 225 mg into the skin.   albuterol  (PROVENTIL ) (2.5 MG/3ML) 0.083% nebulizer solution Take 3 mLs (2.5 mg total) by nebulization every 6 (six) hours as needed for wheezing or shortness of breath.   ascorbic acid  (VITAMIN C ) 500 MG tablet Take 500 mg by mouth daily.   atomoxetine  (STRATTERA ) 60 MG capsule Take 1 capsule (60 mg total) by mouth daily.   Azelastine -Fluticasone  (DYMISTA ) 137-50 MCG/ACT SUSP PLACE 1 SPRAY INTO BOTH NOSTRILS 2 (TWO) TIMES DAILY   baclofen (LIORESAL) 10 MG tablet Take 10 mg by mouth 3 (three) times daily.    bisoprolol  (ZEBETA ) 5 MG tablet TAKE 1.5 TABLETS BY MOUTH DAILY.   budesonide -formoterol  (SYMBICORT ) 160-4.5 MCG/ACT inhaler Inhale 1 puff into the lungs daily.   busPIRone  (BUSPAR ) 10 MG tablet Take 1 tablet (10 mg total) by mouth 2 (two) times daily.   calcium  carbonate (OS-CAL) 1250 (500 Ca) MG chewable tablet Chew by mouth.   cariprazine  (VRAYLAR ) 1.5 MG capsule Take 1 capsule (1.5 mg total) by mouth daily.   Cholecalciferol 25 MCG (1000 UT) tablet Take 1 Units by mouth daily at 2 PM.   clobetasol  (TEMOVATE ) 0.05 % external solution Apply topically 2 (two) times daily as needed.   clonazePAM  (KLONOPIN ) 0.5 MG tablet Take 1 tablet (0.5 mg total) by mouth 2 (two) times daily as needed for anxiety.   dicyclomine (BENTYL) 10 MG capsule Take 10 mg by mouth as needed for spasms.   EPINEPHrine  (EPIPEN  2-PAK) 0.3 mg/0.3 mL IJ SOAJ injection Inject 0.3 mg into the muscle as needed for anaphylaxis.   fexofenadine  (ALLEGRA ) 180 MG tablet TAKE 1 TABLET BY MOUTH EVERY DAY   hydrOXYzine  (VISTARIL ) 25 MG capsule Take 1-2 capsules (25-50 mg total) by mouth daily as needed.   levothyroxine  (SYNTHROID ) 50 MCG tablet Take 1 tablet (50 mcg total) by mouth daily before breakfast.   LINZESS 72 MCG capsule Take 72 mcg by mouth daily.   Loratadine  (CLARITIN  PO) Take by mouth.   metoprolol  tartrate (LOPRESSOR ) 25 MG tablet TAKE 1 TABLET (25  MG TOTAL) BY MOUTH AS NEEDED.   montelukast  (SINGULAIR ) 10 MG tablet TAKE 1 TABLET BY MOUTH EVERY DAY   Multiple Vitamin (MULTIVITAMIN) capsule Take by mouth.   norethindrone  (MICRONOR ) 0.35 MG tablet TAKE 1 TABLET BY MOUTH EVERY DAY   Olopatadine  HCl 0.2 % SOLN Apply 1 drop to eye every morning.   ondansetron (ZOFRAN-ODT) 4 MG disintegrating tablet DISSOLVE ON TONGUE AT ONSET OF NAUSEA- 30 DAY SUPPLY (Patient taking differently: Take 8 mg by mouth every 8 (eight) hours.)   pantoprazole  (PROTONIX ) 40 MG tablet Take 1 tablet by mouth in the morning and at bedtime.   Polyethylene  Glycol 3350 (MIRALAX  PO) Take by mouth as needed.   RESTASIS 0.05 % ophthalmic emulsion 1 drop 2 (two) times daily.   RIZATRIPTAN BENZOATE PO Take by mouth.   sertraline  (ZOLOFT ) 50 MG tablet Take 1.5 tablets (75 mg total) by mouth daily.   topiramate (TOPAMAX) 50 MG tablet Take 100 mg by mouth 2 (two) times daily.   VENTOLIN  HFA 108 (90 Base) MCG/ACT inhaler INHALE 2 PUFFS BY MOUTH EVERY 4 HOURS AS NEEDED FOR WHEEZE OR FOR SHORTNESS OF BREATH   [DISCONTINUED] Galcanezumab-gnlm 120 MG/ML SOAJ Inject into the skin. Emgality (Patient not taking: Reported on 05/02/2024)   No facility-administered medications prior to visit.    ROS per HPI      Objective:     BP 132/86   Pulse 87   Temp 97.8 F (36.6 C)   Ht 4' 11.06" (1.5 m)   Wt 229 lb 12.8 oz (104.2 kg)   LMP 05/06/2024   SpO2 92%   BMI 46.32 kg/m  BP Readings from Last 3 Encounters:  05/13/24 132/86  05/02/24 108/86  04/04/24 98/60   Wt Readings from Last 3 Encounters:  05/13/24 229 lb 12.8 oz (104.2 kg)  05/02/24 230 lb 2 oz (104.4 kg)  04/04/24 225 lb (102.1 kg)   SpO2 Readings from Last 3 Encounters:  05/13/24 92%  05/02/24 97%  04/04/24 97%      Physical Exam Vitals and nursing note reviewed.  Constitutional:      General: She is not in acute distress.    Appearance: Normal appearance. She is well-developed and well-groomed. She is morbidly obese. She is not ill-appearing, toxic-appearing or diaphoretic.  HENT:     Head: Normocephalic and atraumatic.     Jaw: There is normal jaw occlusion.     Right Ear: Hearing, tympanic membrane, ear canal and external ear normal.     Left Ear: Hearing, tympanic membrane, ear canal and external ear normal.     Nose: Nose normal.     Mouth/Throat:     Lips: Pink.     Mouth: Mucous membranes are moist.     Pharynx: Oropharynx is clear. Uvula midline.  Eyes:     General: Lids are normal.     Extraocular Movements: Extraocular movements intact.     Conjunctiva/sclera:  Conjunctivae normal.     Pupils: Pupils are equal, round, and reactive to light.  Neck:     Thyroid : No thyroid  mass, thyromegaly or thyroid  tenderness.     Vascular: No carotid bruit or JVD.     Trachea: Trachea and phonation normal.  Cardiovascular:     Rate and Rhythm: Normal rate and regular rhythm.     Chest Wall: PMI is not displaced.     Pulses: Normal pulses.     Heart sounds: Normal heart sounds. No murmur heard.    No friction  rub. No gallop.  Pulmonary:     Effort: Pulmonary effort is normal. No respiratory distress.     Breath sounds: Normal breath sounds. No wheezing.  Abdominal:     General: Bowel sounds are normal. There is no distension or abdominal bruit.     Palpations: Abdomen is soft. There is no hepatomegaly or splenomegaly.     Tenderness: There is no abdominal tenderness. There is no right CVA tenderness or left CVA tenderness.     Hernia: No hernia is present.  Musculoskeletal:        General: Normal range of motion.     Cervical back: Normal range of motion and neck supple.     Right lower leg: No edema.     Left lower leg: No edema.  Lymphadenopathy:     Cervical: No cervical adenopathy.  Skin:    General: Skin is warm and dry.     Capillary Refill: Capillary refill takes less than 2 seconds.     Coloration: Skin is not cyanotic, jaundiced or pale.     Findings: No rash.  Neurological:     General: No focal deficit present.     Mental Status: She is alert and oriented to person, place, and time.     Sensory: Sensation is intact.     Motor: Motor function is intact.     Coordination: Coordination is intact.     Gait: Gait is intact.     Deep Tendon Reflexes: Reflexes are normal and symmetric.  Psychiatric:        Attention and Perception: Attention and perception normal.        Mood and Affect: Mood and affect normal.        Speech: Speech normal.        Behavior: Behavior normal. Behavior is cooperative.        Thought Content: Thought content  normal.        Cognition and Memory: Cognition and memory normal.        Judgment: Judgment normal.       Last CBC Lab Results  Component Value Date   WBC 7.1 05/18/2023   HGB 13.5 05/18/2023   HCT 40.9 05/18/2023   MCV 92 05/18/2023   MCH 30.3 05/18/2023   RDW 13.0 05/18/2023   PLT 255 05/18/2023   Last metabolic panel Lab Results  Component Value Date   GLUCOSE 84 02/08/2024   NA 147 (H) 02/08/2024   K 4.6 02/08/2024   CL 110 (H) 02/08/2024   CO2 21 02/08/2024   BUN 12 02/08/2024   CREATININE 0.83 02/08/2024   EGFR 103 02/08/2024   CALCIUM  9.6 02/08/2024   PROT 5.8 (L) 02/08/2024   ALBUMIN 4.1 02/08/2024   LABGLOB 1.7 02/08/2024   AGRATIO 2.4 (H) 10/11/2022   BILITOT <0.2 02/08/2024   ALKPHOS 84 02/08/2024   AST 15 02/08/2024   ALT 16 02/08/2024   ANIONGAP 9 04/05/2018   Last lipids Lab Results  Component Value Date   CHOL 198 02/08/2024   HDL 47 02/08/2024   LDLCALC 124 (H) 02/08/2024   TRIG 151 (H) 02/08/2024   CHOLHDL 4.2 02/08/2024   Last hemoglobin A1c Lab Results  Component Value Date   HGBA1C 5.1 10/11/2023   Last thyroid  functions Lab Results  Component Value Date   TSH 2.200 02/08/2024   T4TOTAL 7.5 05/18/2023   Last vitamin D  Lab Results  Component Value Date   VD25OH 60.0 05/18/2023   Last vitamin B12 and Folate  Lab Results  Component Value Date   VITAMINB12 466 10/11/2022   FOLATE >20.0 04/04/2022        Assessment & Plan:    Routine Health Maintenance and Physical Exam  Immunization History  Administered Date(s) Administered   DTaP 09/24/2003, 12/10/2003, 02/16/2004, 11/01/2004, 07/23/2007, 07/09/2012   Dtap, Unspecified 11/01/2004, 07/23/2007   H1N1 10/07/2008, 11/12/2008   HIB (PRP-OMP) 09/24/2003, 12/10/2003, 02/16/2004, 11/01/2004, 07/23/2017   HIB (PRP-T) 09/24/2003, 12/10/2003, 02/16/2004   HIB, Unspecified 11/01/2004   HPV Quadrivalent 09/04/2013, 11/05/2013, 03/20/2014   Hep A, Unspecified 07/24/2006,  07/23/2007   Hep B, Unspecified 09/03/2003, 12/10/2003, 07/26/2004   IPV 09/24/2003, 12/10/2003, 11/01/2004, 07/23/2007   Influenza Nasal 08/10/2010, 09/03/2012, 09/04/2013, 09/14/2014   Influenza Split 09/05/2011, 09/05/2021   Influenza, Seasonal, Injecte, Preservative Fre 10/12/2005, 10/07/2008, 09/27/2009, 08/29/2023   Influenza,Quad,Nasal, Live 09/03/2012, 09/04/2013, 09/14/2014   Influenza,inj,Quad PF,6+ Mos 08/30/2015, 09/21/2016, 09/13/2017, 09/06/2018, 08/27/2019, 11/12/2020, 09/05/2021, 10/11/2022   Influenza,inj,quad, With Preservative 10/12/2018   Influenza-Unspecified 10/07/2008, 09/05/2011, 09/05/2011, 08/29/2023   MMR 07/26/2004, 07/23/2007   Meningococcal B, OMV 10/21/2019, 05/13/2020   Meningococcal Mcv4o 09/14/2014, 10/21/2019   Novel Infuenza-h1n1-09 11/12/2008   PFIZER Comirnaty(Gray Top)Covid-19 Tri-Sucrose Vaccine 04/01/2021, 05/06/2021   PPD Test 08/29/2023   Pfizer(Comirnaty)Fall Seasonal Vaccine 12 years and older 11/17/2022, 10/05/2023   Pneumococcal Conjugate PCV 7 09/24/2003, 12/10/2003, 02/16/2004, 11/01/2004   Pneumococcal Polysaccharide-23 07/09/2012, 03/06/2022   Pneumococcal-Unspecified 09/24/2003, 02/16/2004, 11/01/2004, 12/09/2006   Polio, Unspecified 11/01/2004, 07/23/2007   Tdap 09/14/2014   Varicella 07/26/2004, 07/23/2007    Health Maintenance  Topic Date Due   Pneumococcal Vaccine 106-45 Years old (2 of 2 - PCV) 03/07/2023   COVID-19 Vaccine (5 - Pfizer risk 2024-25 season) 05/29/2024 (Originally 04/03/2024)   INFLUENZA VACCINE  07/04/2024   DTaP/Tdap/Td (7 - Td or Tdap) 09/14/2024   HPV VACCINES  Completed   Hepatitis C Screening  Completed   HIV Screening  Completed   Meningococcal B Vaccine  Completed   CHLAMYDIA SCREENING  Discontinued    Discussed health benefits of physical activity, and encouraged her to engage in regular exercise appropriate for her age and condition.  Problem List Items Addressed This Visit       Cardiovascular  and Mediastinum   Hypertension   Relevant Orders   CBC with Differential/Platelet   CMP14+EGFR   Lipid panel   Thyroid  Panel With TSH     Other   B12 deficiency - Primary   Relevant Orders   CBC with Differential/Platelet   Vitamin B12   Other Visit Diagnoses       Annual physical exam       Relevant Orders   CBC with Differential/Platelet   CMP14+EGFR     BMI 40.0-44.9, adult (HCC)       Relevant Orders   CBC with Differential/Platelet   CMP14+EGFR   Lipid panel   Thyroid  Panel With TSH   Vitamin B12   Bayer DCA Hb A1c Waived     Muscle cramps       Relevant Orders   Magnesium         Weight management Significant weight fluctuations with a recent plateau. Weight loss is recommended for IIH management. GLP-1 receptor agonists like semaglutide or liraglutide are considered due to their benefits in weight management and additional health benefits. - Instruct her to contact her insurance to determine coverage for weight management medications, specifically GLP-1 receptor agonists like semaglutide or liraglutide. - If insurance covers, consider prescribing semaglutide or liraglutide, with a follow-up visit in  four weeks after starting the medication. - Avoid prescribing phentermine due to her anxiety and potential side effects.  Asthma Asthma increases the risk for respiratory infections, including pneumonia. The pneumonia vaccine is recommended to reduce this risk, with potential mild side effects similar to flu-like symptoms post-vaccination. - Schedule an appointment for the pneumonia vaccine when she does not have work the next day. - Provide educational materials about the pneumonia vaccine for her to review.  Insomnia Difficulty sleeping, sometimes taking hours to fall asleep. Occasional use of Klonopin , avoided if early waking is needed due to next-day drowsiness. Melatonin causes excessive daytime sleepiness. Behavioral modifications are recommended to improve  sleep quality. - Advise her to avoid phone use when unable to sleep to prevent brain stimulation. - Suggest using white noise or green noise to aid sleep without stimulating the brain.  Thyroid  disorder Thyroid  disorder under endocrinologist care. Routine screening for thyroid  function is planned to ensure stability. - Order thyroid  function tests as part of routine lab work.  General Health Maintenance Routine health maintenance includes vaccinations, screenings, and lifestyle modifications. She is not sexually active, so STI screenings are not needed. PAP smears will start at age 57. She does not smoke, drink, or use street drugs. - Perform routine lab work to screen for anemia, thyroid  problems, electrolyte imbalances, cholesterol, blood counts, and A1c. - Schedule a nurse visit for the pneumonia vaccine. - Plan for PAP smears to start at age 67. - Ensure she follows up with the eye specialist in July as referred by her neurologist.       Return in 1 year (on 05/13/2025) for Annual Physical.     Kattie Parrot, FNP

## 2024-05-14 LAB — LIPID PANEL
Cholesterol, Total: 207 mg/dL — ABNORMAL HIGH (ref 100–199)
HDL: 49 mg/dL (ref >39)
LDL CALC COMMENT:: 4.2 ratio (ref 0.0–4.4)
LDL Chol Calc (NIH): 133 mg/dL — ABNORMAL HIGH (ref 0–99)
Triglycerides: 139 mg/dL (ref 0–149)
VLDL Cholesterol Cal: 25 mg/dL (ref 5–40)

## 2024-05-14 LAB — CMP14+EGFR
ALT: 18 IU/L (ref 0–32)
AST: 15 IU/L (ref 0–40)
Albumin: 4.3 g/dL (ref 4.0–5.0)
Alkaline Phosphatase: 100 IU/L (ref 42–106)
BUN/Creatinine Ratio: 16 (ref 9–23)
BUN: 14 mg/dL (ref 6–20)
CO2: 20 mmol/L (ref 20–29)
Calcium: 9.7 mg/dL (ref 8.7–10.2)
Chloride: 112 mmol/L — ABNORMAL HIGH (ref 96–106)
Creatinine, Ser: 0.86 mg/dL (ref 0.57–1.00)
Globulin, Total: 2.2 g/dL (ref 1.5–4.5)
Glucose: 76 mg/dL (ref 70–99)
Potassium: 4.6 mmol/L (ref 3.5–5.2)
Sodium: 146 mmol/L — ABNORMAL HIGH (ref 134–144)
Total Protein: 6.5 g/dL (ref 6.0–8.5)
eGFR: 99 mL/min/{1.73_m2} (ref >59)

## 2024-05-14 LAB — THYROID PANEL WITH TSH
Free Thyroxine Index: 1.9 (ref 1.2–4.9)
T3 Uptake Ratio: 27 % (ref 24–39)
T4, Total: 7 ug/dL (ref 4.5–12.0)
TSH: 1.34 u[IU]/mL (ref 0.450–4.500)

## 2024-05-14 LAB — CBC WITH DIFFERENTIAL/PLATELET
Basophils Absolute: 0 10*3/uL (ref 0.0–0.2)
Basos: 0 %
EOS (ABSOLUTE): 0.1 10*3/uL (ref 0.0–0.4)
Eos: 1 %
Hematocrit: 41.1 % (ref 34.0–46.6)
Hemoglobin: 13.3 g/dL (ref 11.1–15.9)
Immature Grans (Abs): 0 10*3/uL (ref 0.0–0.1)
Immature Granulocytes: 0 %
Lymphocytes Absolute: 1.9 10*3/uL (ref 0.7–3.1)
Lymphs: 19 %
MCH: 30.2 pg (ref 26.6–33.0)
MCHC: 32.4 g/dL (ref 31.5–35.7)
MCV: 93 fL (ref 79–97)
Monocytes Absolute: 0.5 10*3/uL (ref 0.1–0.9)
Monocytes: 5 %
Neutrophils Absolute: 7.4 10*3/uL — ABNORMAL HIGH (ref 1.4–7.0)
Neutrophils: 75 %
Platelets: 286 10*3/uL (ref 150–450)
RBC: 4.41 x10E6/uL (ref 3.77–5.28)
RDW: 13.1 % (ref 11.7–15.4)
WBC: 9.9 10*3/uL (ref 3.4–10.8)

## 2024-05-14 LAB — VITAMIN B12: Vitamin B-12: 516 pg/mL (ref 232–1245)

## 2024-05-14 LAB — MAGNESIUM: Magnesium: 2.1 mg/dL (ref 1.6–2.3)

## 2024-05-20 ENCOUNTER — Ambulatory Visit: Payer: MEDICAID | Admitting: Family Medicine

## 2024-06-01 ENCOUNTER — Other Ambulatory Visit: Payer: Self-pay | Admitting: Allergy & Immunology

## 2024-06-02 DIAGNOSIS — J4550 Severe persistent asthma, uncomplicated: Secondary | ICD-10-CM | POA: Diagnosis not present

## 2024-06-09 ENCOUNTER — Other Ambulatory Visit: Payer: Self-pay | Admitting: Allergy & Immunology

## 2024-06-10 ENCOUNTER — Encounter (HOSPITAL_BASED_OUTPATIENT_CLINIC_OR_DEPARTMENT_OTHER): Payer: Self-pay

## 2024-06-10 ENCOUNTER — Other Ambulatory Visit: Payer: Self-pay | Admitting: *Deleted

## 2024-06-10 DIAGNOSIS — R102 Pelvic and perineal pain: Secondary | ICD-10-CM

## 2024-06-10 MED ORDER — NAPROXEN 500 MG PO TABS: 500.0000 mg | ORAL_TABLET | Freq: Two times a day (BID) | ORAL | 3 refills | Status: AC

## 2024-06-10 NOTE — Telephone Encounter (Signed)
 Agree with recommendations as provided by [facility].  Aleesia Henney S Kenetra Hildenbrand, NP

## 2024-06-10 NOTE — Telephone Encounter (Signed)
 Please review and advise.

## 2024-06-10 NOTE — Progress Notes (Signed)
 Cardiology Office Note:  .   Date:  06/10/2024  ID:  Molly [name], DOB 29-Jul-2001, MRN [id number] PCP: [name], FNP  [facility] Providers Cardiologist:  Dr. Fernande  History of Present Illness: Molly   ANGLES [name] is a 23 y.o. 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 Her brother has low cortisol and her mother has hypothyroidism.   Echo 12/2022 LVEF 55-60%, mild LVH, no residual shunting across ASD repair.   She saw Dr. Fernande 05/14/23, she had l;ost weight, was on different birth control and feeling quite a bit better. 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 Walker, NP 12/30/23, she was working at a daycare, accompanied by her Mom who reported worse POTS symptoms Reported not always able to get her goal 40oz of water in Shift work sometimes making it hard. Largely though sounded fairly well controlled and BP was at goal   Today's visit is scheduled as an annual visit ROS:   She comes today accompanied by her Mom She(they) report her symptom onset after COVID illness a few years ago. She has symptoms most days Recently though the first full syncopal event.  Episodes occur the same She can be seated or standing Starts to feel hot/flushed, lightheaded > HR gets fast then progressively lightheaded. 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  Generally uses ine liquid IV a day, and tries/thinks she keep pretty well hydrated otherwise. She does not use any compression wear, mentions that she tends to generally feel pretty hot and even in the winter wears shorts, can not tolerate long pants even.   Studies Reviewed: Molly    EKG done today and reviewed by myself:  SR, low atrial rhythm 75bpm, no significant change from prior   12/29/22: TTE  1. Left ventricular ejection fraction, by estimation, is 55 to 60%. The  left ventricle has normal function. The left ventricle has no regional  wall motion abnormalities. There is mild concentric left ventricular  hypertrophy. Left ventricular diastolic  function could not be evaluated.   2. Right ventricular systolic function is normal. The right ventricular  size is normal. Tricuspid regurgitation signal is inadequate for assessing  PA pressure.   3. S/p helix repair 2012, device well seated. No interatrial shunt.   4. No evidence of mitral valve regurgitation.   5. The aortic valve was not well visualized. Aortic valve regurgitation  is not visualized.   6. The inferior vena cava is normal in size with greater than 50%  respiratory variability, suggesting right atrial pressure of 3 mmHg.   Conclusion(s)/Recommendation(s): Normal biventricular function without  evidence of hemodynamically significant valvular heart disease.  Risk Assessment/Calculations:    Physical Exam:   VS:  There were no vitals taken for this visit.   Wt Readings from Last 3 Encounters:  05/13/24 229 lb 12.8 oz (104.2 kg)  05/02/24 230 lb 2 oz (104.4 kg)  04/04/24 225 lb (102.1 kg)    GEN: Well nourished, well developed in no acute distress NECK: No JVD; No carotid bruits CARDIAC: RRR, no murmurs, rubs,  gallops RESPIRATORY:  CTA b/l without rales, wheezing or rhonchi  ABDOMEN: Soft, non-tender, non-distended EXTREMITIES: No edema; No deformity   ASSESSMENT AND PLAN: .    HTN Orthostatic intolerance Dr. Fernande does not report a formal diagnosis of POTS  Orthostatic BP negative here today, though did report lightheaded/seeing spots upon standing Discuss orthostatic intolerance and HTN balance  She is intolerant of compression, give HTN, will try to stay away from abdominal binder Urged hydration  Symptom recognition and safety She does not drive  She will continue low dose BB Discussed polypharmacy, and concern of medication side effcts, interactions, they report that they have discussed with purpose to try and minimize her medications, but have been unable to, requiring all on her list  She doesn't use the PRN metoprolol , has only made her feel worse and advised her no to use it  Given Dr. Celine upcoming retirement, suggest that they look into  [facility] Dr. KYM Smalling (at [facility])  Will plan to transition in the meantime to Dr. Kennyth   ATach Continue low dose beta blocker  Dispo: back in 2-67mo, sooner if needed  Signed, Charlies Macario Arthur, PA-C

## 2024-06-11 ENCOUNTER — Ambulatory Visit: Payer: Self-pay

## 2024-06-11 NOTE — Telephone Encounter (Signed)
 FYI Only or Action Required?: FYI only for provider.  Patient was last seen in primary care on 05/13/2024 by Severa Rock HERO, FNP.  Called Nurse Triage reporting Foot Pain.  Symptoms began a week ago.  Interventions attempted: Nothing.  Symptoms are: unchanged.  Triage Disposition: See HCP Within 4 Hours (Or PCP Triage)  Patient/caregiver understands and will follow disposition?: Yes  1. ONSET: When did the pain start? About a week ago and got worse yesterday  2. LOCATION: Where is the pain located?  Left foot is swollen  3. PAIN: How bad is the pain? (Scale 1-10; or mild, moderate, severe) - MILD (1-3): Doesn't interfere with normal activities.  - MODERATE (4-7): Interferes with normal activities (e.g., work or school) or awakens from sleep, limping.  - SEVERE (8-10): Excruciating pain, unable to do any normal activities, unable to walk.  severe 4. WORK OR EXERCISE: Has there been any recent work or exercise that involved this part of the body?  no 5. CAUSE: What do you think is causing the foot pain? unknown 6. OTHER SYMPTOMS: Do you have any other symptoms? (e.g., leg pain, rash, fever, numbness) Numbness in big toe 7. PREGNANCY: Is there any chance you are pregnant? When was your last menstrual period? na  Copied from CRM [id number]. Topic: Clinical - Red Word Triage >> Jun 11, 2024 12:27 PM Montie POUR wrote: Red Word that prompted transfer to Nurse Triage:  Molly [name]'s left foot is very painful. Pain level is a 10. She cannot walk on it Reason for Disposition  [1] SEVERE pain (e.g., excruciating, unable to do any normal activities) AND [2] not improved after 2 hours of pain medicine  Protocols used: Foot Pain-A-AH

## 2024-06-13 ENCOUNTER — Encounter: Payer: Self-pay | Admitting: Allergy & Immunology

## 2024-06-13 ENCOUNTER — Ambulatory Visit: Payer: MEDICAID | Attending: Physician Assistant | Admitting: Physician Assistant

## 2024-06-13 VITALS — BP 94/60 | HR 76 | Ht 59.0 in | Wt 233.0 lb

## 2024-06-13 DIAGNOSIS — R55 Syncope and collapse: Secondary | ICD-10-CM | POA: Diagnosis present

## 2024-06-13 DIAGNOSIS — I951 Orthostatic hypotension: Secondary | ICD-10-CM | POA: Diagnosis present

## 2024-06-13 DIAGNOSIS — I10 Essential (primary) hypertension: Secondary | ICD-10-CM | POA: Insufficient documentation

## 2024-06-13 DIAGNOSIS — I4719 Other supraventricular tachycardia: Secondary | ICD-10-CM | POA: Diagnosis present

## 2024-06-13 NOTE — Patient Instructions (Addendum)
 Medication Instructions:   Your physician recommends that you continue on your current medications as directed. Please refer to the Current Medication list given to you today.   *If you need a refill on your cardiac medications before your next appointment, please call your pharmacy*   Lab Work: NONE ORDERED  TODAY     If you have labs (blood work) drawn today and your tests are completely normal, you will receive your results only by: MyChart Message (if you have MyChart) OR A paper copy in the mail If you have any lab test that is abnormal or we need to change your treatment, we will call you to review the results.   Testing/Procedures: NONE ORDERED  TODAY   Follow-Up: At [facility], you and your health needs are our priority.  As part of our continuing mission to provide you with exceptional heart care, our providers are all part of one team.  This team includes your primary Cardiologist (physician) and Advanced Practice Providers or APPs (Physician Assistants and Nurse Practitioners) who all work together to provide you with the care you need, when you need it.   Your next appointment:   3 month(s)  Provider:  [name] Kitty, MD or Charlies Arthur, PA-C     We recommend signing up for the patient portal called MyChart.  Sign up information is provided on this After Visit Summary.  MyChart is used to connect with patients for Virtual Visits (Telemedicine).  Patients are able to view lab/test results, encounter notes, upcoming appointments, etc.  Non-urgent messages can be sent to your provider as well.   To learn more about what you can do with MyChart, go to [email or web address].

## 2024-06-19 ENCOUNTER — Encounter: Payer: Self-pay | Admitting: Adult Health

## 2024-06-19 ENCOUNTER — Ambulatory Visit (INDEPENDENT_AMBULATORY_CARE_PROVIDER_SITE_OTHER): Payer: MEDICAID | Admitting: Adult Health

## 2024-06-19 VITALS — BP 117/78 | HR 105 | Ht 59.0 in | Wt 233.0 lb

## 2024-06-19 DIAGNOSIS — N644 Mastodynia: Secondary | ICD-10-CM | POA: Diagnosis not present

## 2024-06-19 NOTE — Progress Notes (Signed)
  Subjective:     Patient ID: Molly [name], female   DOB: 2001/02/25, 23 y.o.   MRN: [id number]  HPI Molly [name] is a 23 year old white female,with SO, G0P0, in complaining of knot in left breat for about 2 weeks and it is tender, no known injury.  PCP is Rock Bruns  Review of Systems +Knot in left breat for about 2 weeks and it is tender, no known injury.   Reviewed past medical,surgical, social and family history. Reviewed medications and allergies.  Objective:   Physical Exam BP 117/78 (BP Location: Right Arm, Patient Position: Sitting, Cuff Size: Large)   Pulse (!) 105   Ht 4' 11 (1.499 m)   Wt 233 lb (105.7 kg)   LMP 05/20/2024 (Approximate)   BMI 47.06 kg/m      Skin warm and dry,  Breasts:no dominate palpable mass, retraction or nipple discharge on the right, on the left no retraction or nipple discharge, has tenderness at 11 o'clock 4 FB from areola and irregular breast tissue, felt to be Fibroglandular   Upstream - 06/19/24 1138       Pregnancy Intention Screening   Does the patient want to become pregnant in the next year? Ok Either Way    Does the patient's partner want to become pregnant in the next year? Ok Either Way    Would the patient like to discuss contraceptive options today? No      Contraception Wrap Up   Current Method Oral Contraceptive    End Method Oral Contraceptive    Contraception Counseling Provided Yes          Assessment:     1. Breast pain, left (Primary) On exam has tenderness at 11 o'clock 4 FB from areola and irregular breast tissue, felt to be Fibroglandular. She says she noticed knot about 2 weeks ago there Left breast US  scheduled at [facility] for 06/24/24 at 11:40 am to assess  Decrease caffeine Do not wear under wire all the time  - US  LIMITED ULTRASOUND INCLUDING AXILLA LEFT BREAST ; Future     Plan:     Follow up prn

## 2024-06-24 ENCOUNTER — Ambulatory Visit (HOSPITAL_COMMUNITY)
Admission: RE | Admit: 2024-06-24 | Discharge: 2024-06-24 | Disposition: A | Discharge status: Home / Self Care | Payer: MEDICAID | Source: Ambulatory Visit | Attending: Adult Health | Admitting: Adult Health

## 2024-06-24 DIAGNOSIS — N644 Mastodynia: Secondary | ICD-10-CM | POA: Insufficient documentation

## 2024-07-03 DIAGNOSIS — J4550 Severe persistent asthma, uncomplicated: Secondary | ICD-10-CM | POA: Diagnosis not present

## 2024-07-14 ENCOUNTER — Encounter: Payer: Self-pay | Admitting: Allergy & Immunology

## 2024-07-17 ENCOUNTER — Encounter (HOSPITAL_BASED_OUTPATIENT_CLINIC_OR_DEPARTMENT_OTHER): Payer: Self-pay

## 2024-07-17 ENCOUNTER — Encounter: Payer: Self-pay | Admitting: Family Medicine

## 2024-07-17 ENCOUNTER — Encounter (HOSPITAL_COMMUNITY): Payer: Self-pay

## 2024-07-18 NOTE — Telephone Encounter (Signed)
 I think one would be okay. Just don't overdo it!

## 2024-07-25 ENCOUNTER — Encounter: Payer: Self-pay | Admitting: Family Medicine

## 2024-07-25 ENCOUNTER — Ambulatory Visit (INDEPENDENT_AMBULATORY_CARE_PROVIDER_SITE_OTHER): Payer: MEDICAID | Admitting: Family Medicine

## 2024-07-25 VITALS — BP 90/58 | HR 101 | Temp 97.2°F | Ht 59.0 in | Wt 228.0 lb

## 2024-07-25 DIAGNOSIS — J029 Acute pharyngitis, unspecified: Secondary | ICD-10-CM | POA: Diagnosis not present

## 2024-07-25 DIAGNOSIS — R052 Subacute cough: Secondary | ICD-10-CM | POA: Diagnosis not present

## 2024-07-25 LAB — RAPID STREP SCREEN (MED CTR MEBANE ONLY): Strep Gp A Ag, IA W/Reflex: NEGATIVE

## 2024-07-25 LAB — CULTURE, GROUP A STREP

## 2024-07-25 MED ORDER — NYSTATIN 100000 UNIT/ML MT SUSP: 5.0000 mL | Freq: Three times a day (TID) | OROMUCOSAL | 0 refills | Status: DC

## 2024-07-25 NOTE — Progress Notes (Signed)
 BP (!) 90/58   Pulse (!) 101   Temp (!) 97.2 F (36.2 C)   Ht 4' 11 (1.499 m)   Wt 228 lb (103.4 kg)   SpO2 97%   BMI 46.05 kg/m    Subjective:   Patient ID: Molly [name], female    DOB: 14-Jun-2001, 23 y.o.   MRN: [id number]  HPI: Molly [name] is a 23 y.o. female presenting on 07/25/2024 for Sore Throat and Ear Pain (bilateral)   Discussed the use of AI scribe software for clinical note transcription with the patient, who gave verbal consent to proceed.  History of Present Illness   Molly [name] is a 23 year old female with asthma who presents with a persistent sore throat for four weeks.  She has been experiencing a persistent sore throat for almost four weeks, which is constant with intermittent relief throughout the day. She has tried various remedies including warm soup, cold compresses, cough drops, and Tylenol, but none have provided significant relief. She visited urgent care twice, on July 29th and August 16th, where strep tests were negative both times. She reports that body aches and chills are normal for her and have been worse recently, which she attributes to working longer hours.  She has a history of asthma but reports no recent issues with wheezing or breathing difficulties. She uses inhalers, including Symbicort , but does not consistently rinse her mouth after use. No history of thrush or seeing white patches in her mouth.  Her mother mentions that the family experienced a viral illness with cough and stuffiness about four weeks ago, but her sore throat has persisted. Her ears have been bothering her for about a week, but she does not specify which ear is more affected.  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          Relevant past medical, surgical, family and social history reviewed and updated as indicated. Interim medical history since our last visit  reviewed. Allergies and medications reviewed and updated.  Review of Systems  Per HPI unless specifically indicated above   Allergies as of 07/25/2024       Reactions   Brompheniramine Swelling, Anxiety   Glucosamine Forte [nutritional Supplements] Anaphylaxis   Due to containing shellfish   Nutritional Supplements Anaphylaxis   Oyster Extract Anaphylaxis   Shellfish-derived Products Anaphylaxis   Throat swelling   Cats Claw (uncaria Tomentosa) Nausea And Vomiting   Diamox [acetazolamide] Other (See Comments)   Numbness- feet, hands, lips   Lactose    Lactose Intolerance (gi)    Topiramate Other (See Comments)   Numbness to face, hands and feet   Bromfed Dm [pseudoeph-bromphen-dm] Anxiety   CARBOFED DM        Medication List        Accurate as of July 25, 2024 10:38 AM. If you have any questions, ask your nurse or doctor.          Ajovy 225 MG/1.5ML Soaj Generic drug: Fremanezumab-vfrm Inject 225 mg into the skin.   albuterol  (2.5 MG/3ML) 0.083% nebulizer solution Commonly known as: PROVENTIL  Take 3 mLs (2.5 mg total) by nebulization every 6 (six) hours as needed for wheezing or shortness of breath.   Ventolin  HFA 108 (90 Base) MCG/ACT inhaler Generic drug: albuterol  INHALE 2 PUFFS BY MOUTH EVERY 4 HOURS AS NEEDED FOR WHEEZE OR FOR SHORTNESS OF BREATH   ascorbic acid  500  MG tablet Commonly known as: VITAMIN C  Take 500 mg by mouth daily.   atomoxetine  60 MG capsule Commonly known as: STRATTERA  Take 1 capsule (60 mg total) by mouth daily.   baclofen 10 MG tablet Commonly known as: LIORESAL Take 10 mg by mouth 3 (three) times daily.   bisoprolol  5 MG tablet Commonly known as: ZEBETA  TAKE 1.5 TABLETS BY MOUTH DAILY.   budesonide -formoterol  160-4.5 MCG/ACT inhaler Commonly known as: Symbicort  Inhale 1 puff into the lungs daily.   busPIRone  10 MG tablet Commonly known as: BUSPAR  Take 1 tablet (10 mg total) by mouth 2 (two) times daily.   calcium   carbonate 1250 (500 Ca) MG chewable tablet Commonly known as: OS-CAL Chew by mouth.   cariprazine  1.5 MG capsule Commonly known as: Vraylar  Take 1 capsule (1.5 mg total) by mouth daily.   Cholecalciferol 25 MCG (1000 UT) tablet Take 1 Units by mouth daily at 2 PM.   CLARITIN  PO Take by mouth.   clobetasol  0.05 % external solution Commonly known as: TEMOVATE  Apply topically 2 (two) times daily as needed.   clonazePAM  0.5 MG tablet Commonly known as: KlonoPIN  Take 1 tablet (0.5 mg total) by mouth 2 (two) times daily as needed for anxiety.   dicyclomine 10 MG capsule Commonly known as: BENTYL Take 10 mg by mouth as needed for spasms.   Dymista  137-50 MCG/ACT Susp Generic drug: Azelastine -Fluticasone  PLACE 1 SPRAY INTO BOTH NOSTRILS 2 (TWO) TIMES DAILY   EPINEPHrine  0.3 mg/0.3 mL Soaj injection Commonly known as: EPI-PEN INJECT 0.3 MG INTO THE MUSCLE AS NEEDED FOR ANAPHYLAXIS.   fexofenadine  180 MG tablet Commonly known as: ALLEGRA  TAKE 1 TABLET BY MOUTH EVERY DAY   hydrOXYzine  25 MG capsule Commonly known as: VISTARIL  Take 1-2 capsules (25-50 mg total) by mouth daily as needed.   ibuprofen 800 MG tablet Commonly known as: ADVIL Take 800 mg by mouth 3 (three) times daily.   levothyroxine  50 MCG tablet Commonly known as: SYNTHROID  Take 1 tablet (50 mcg total) by mouth daily before breakfast.   Linzess 72 MCG capsule Generic drug: linaclotide Take 72 mcg by mouth daily.   magic mouthwash (nystatin , lidocaine, diphenhydrAMINE) suspension Take 5 mLs by mouth 3 (three) times daily for 7 days. Equal parts nystatin  and lidocaine and diphenhydramine Started by: Molly [name] Molly [name]   metoprolol  tartrate 25 MG tablet Commonly known as: LOPRESSOR  TAKE 1 TABLET (25 MG TOTAL) BY MOUTH AS NEEDED.   MIRALAX  PO Take by mouth as needed.   montelukast  10 MG tablet Commonly known as: SINGULAIR  TAKE 1 TABLET BY MOUTH EVERY DAY   multivitamin capsule Take by mouth.    naproxen  500 MG tablet Commonly known as: NAPROSYN  Take 1 tablet (500 mg total) by mouth 2 (two) times daily with a meal. For painful periods   nitrofurantoin  (macrocrystal-monohydrate) 100 MG capsule Commonly known as: MACROBID  Take 100 mg by mouth 2 (two) times daily.   norethindrone  0.35 MG tablet Commonly known as: MICRONOR  TAKE 1 TABLET BY MOUTH EVERY DAY   Olopatadine  HCl 0.2 % Soln Apply 1 drop to eye every morning.   ondansetron 4 MG disintegrating tablet Commonly known as: ZOFRAN-ODT DISSOLVE ON TONGUE AT ONSET OF NAUSEA- 30 DAY SUPPLY   pantoprazole  40 MG tablet Commonly known as: PROTONIX  Take 1 tablet by mouth in the morning and at bedtime.   Restasis 0.05 % ophthalmic emulsion Generic drug: cycloSPORINE 1 drop 2 (two) times daily.   RIZATRIPTAN BENZOATE PO Take by mouth.   sertraline   50 MG tablet Commonly known as: ZOLOFT  Take 1.5 tablets (75 mg total) by mouth daily.   topiramate 50 MG tablet Commonly known as: TOPAMAX Take 100 mg by mouth 2 (two) times daily.         Objective:   BP (!) 90/58   Pulse (!) 101   Temp (!) 97.2 F (36.2 C)   Ht 4' 11 (1.499 m)   Wt 228 lb (103.4 kg)   SpO2 97%   BMI 46.05 kg/m   Wt Readings from Last 3 Encounters:  07/25/24 228 lb (103.4 kg)  06/19/24 233 lb (105.7 kg)  06/13/24 233 lb (105.7 kg)    Physical Exam Physical Exam   HEENT: Throat tender on palpation. Throat pain on swallowing. No oral thrush. Eardrums normal, no infection. CHEST: Lungs clear to auscultation bilaterally. CARDIOVASCULAR: Heart regular rate and rhythm.       Rapid strep negative  Assessment & Plan:   Problem List Items Addressed This Visit   None Visit Diagnoses       Sore throat    -  Primary   Relevant Orders   Rapid Strep Screen ([facility] ONLY)   Novel Coronavirus, NAA ([facility])     Subacute cough       Relevant Orders   Rapid Strep Screen ([facility] ONLY)   Novel Coronavirus, NAA ([facility])            Chronic sore throat Chronic sore throat for four weeks with mild cough, negative for strep throat, no signs of thrush, and no tonsils. Possible viral pharyngitis or other irritation.  Asthma Asthma well-controlled with regular inhaler use. - Advised rinsing mouth with water after using Symbicort  to prevent oral thrush.      Rapid strep negative Possible early thrush although we are not seeing it, will treat with Magic mouthwash     Follow up plan: Return if symptoms worsen or fail to improve.  Counseling provided for all of the vaccine components Orders Placed This Encounter  Procedures   Rapid Strep Screen ([facility] ONLY)   Novel Coronavirus, NAA ([facility])    Molly Levins, MD [facility] 07/25/2024, 10:38 AM

## 2024-07-27 LAB — NOVEL CORONAVIRUS, NAA: SARS-CoV-2, NAA: NOT DETECTED

## 2024-07-28 ENCOUNTER — Encounter (HOSPITAL_BASED_OUTPATIENT_CLINIC_OR_DEPARTMENT_OTHER): Payer: Self-pay

## 2024-07-28 ENCOUNTER — Ambulatory Visit: Payer: Self-pay

## 2024-07-28 ENCOUNTER — Ambulatory Visit: Payer: Self-pay | Admitting: Family Medicine

## 2024-07-28 ENCOUNTER — Other Ambulatory Visit: Payer: Self-pay

## 2024-07-28 MED ORDER — PREDNISONE 20 MG PO TABS
ORAL_TABLET | ORAL | 0 refills | Status: DC
Start: 2024-07-28 — End: 2024-08-19

## 2024-07-28 MED ORDER — AMOXICILLIN-POT CLAVULANATE 875-125 MG PO TABS: 1.0000 | ORAL_TABLET | Freq: Two times a day (BID) | ORAL | 0 refills | Status: DC

## 2024-07-28 MED ORDER — FLUCONAZOLE 150 MG PO TABS: 150.0000 mg | ORAL_TABLET | Freq: Once | ORAL | 0 refills | Status: AC

## 2024-07-28 NOTE — Telephone Encounter (Signed)
  FYI Only or Action Required?: Action required by provider: request for appointment and update on patient condition.  Patient was last seen in primary care on 07/25/2024 by Dettinger, [name] LABOR, MD.  Called Nurse Triage reporting Nasal Congestion.  Symptoms began several weeks ago.  Interventions attempted: OTC medications: combination cold medicine.  Symptoms are: gradually worsening.  Triage Disposition: See HCP Within 4 Hours (Or PCP Triage)  Patient/caregiver understands and will follow disposition?: Yes  Copied from CRM #[id number]. Topic: Clinical - Red Word Triage >> Jul 28, 2024 11:47 AM Deaijah H wrote: Red Word that prompted transfer to Nurse Triage: Sick 3wks, has gotten worse, Throat, chest, ears hurting, hoarse, nose congestion Reason for Disposition  [1] SEVERE sinus pain (e.g., excruciating) AND [2] not improved 2 hours after pain medicine  Answer Assessment - Initial Assessment Questions Mother has been giving regular alegra, singulair , nasal sprays, inhalers along with combination cold medicine without relief. Symptoms worsening. NO office visit appointments available today, recommend UC   1. LOCATION: Where does it hurt?      Left side of face 2. ONSET: When did the sinus pain start?  (e.g., hours, days)      Sinus pain since 07/25/2024 3. SEVERITY: How bad is the pain?   (Scale 0-10; or none, mild, moderate or severe)     10/10 4. RECURRENT SYMPTOM: Have you ever had sinus problems before? If Yes, ask: When was the last time? and What happened that time?       5. NASAL CONGESTION: Is the nose blocked? If Yes, ask: Can you open it or must you breathe through your mouth?     Nasal congestion 6. NASAL DISCHARGE: Do you have discharge from your nose? If so ask, What color?     Nose not running 7. FEVER: Do you have a fever? If Yes, ask: What is it, how was it measured, and when did it start?      No measured fever, but has chills and body  aches 8. OTHER SYMPTOMS: Do you have any other symptoms? (e.g., sore throat, cough, earache, difficulty breathing)     Bilateral ear aches, non productive cough, sore throat, hoarse, chest pain  9. PREGNANCY: Is there any chance you are pregnant? When was your last menstrual period?     N/A  Protocols used: Sinus Pain or Congestion-A-AH

## 2024-07-28 NOTE — Telephone Encounter (Signed)
 I sent in amoxicillin  and prednisone  for you

## 2024-07-28 NOTE — Telephone Encounter (Signed)
 noted

## 2024-07-31 ENCOUNTER — Ambulatory Visit (HOSPITAL_BASED_OUTPATIENT_CLINIC_OR_DEPARTMENT_OTHER): Payer: MEDICAID | Admitting: Family

## 2024-07-31 ENCOUNTER — Encounter (HOSPITAL_BASED_OUTPATIENT_CLINIC_OR_DEPARTMENT_OTHER): Payer: Self-pay | Admitting: *Deleted

## 2024-07-31 ENCOUNTER — Encounter (HOSPITAL_BASED_OUTPATIENT_CLINIC_OR_DEPARTMENT_OTHER): Payer: Self-pay | Admitting: Family

## 2024-07-31 VITALS — BP 120/76 | HR 104 | Ht 59.0 in | Wt 226.6 lb

## 2024-07-31 DIAGNOSIS — G90A Postural orthostatic tachycardia syndrome (POTS): Secondary | ICD-10-CM

## 2024-07-31 DIAGNOSIS — I10 Essential (primary) hypertension: Secondary | ICD-10-CM

## 2024-07-31 DIAGNOSIS — G4733 Obstructive sleep apnea (adult) (pediatric): Secondary | ICD-10-CM

## 2024-07-31 MED ORDER — BISOPROLOL FUMARATE 5 MG PO TABS: 5.0000 mg | ORAL_TABLET | Freq: Every day | ORAL | 2 refills | Status: AC

## 2024-07-31 NOTE — Progress Notes (Signed)
 Advanced Hypertension Clinic Assessment:    Date:  07/31/2024   ID:  Molly [name], DOB 12-21-2000, MRN [id number]  PCP:  Molly Rock CHRISTELLA, FNP  Cardiologist:  None  Nephrologist:  Referring MD: Molly Rock CHRISTELLA, FNP   CC: Hypertension  History of Present Illness:    Molly [name] is a 23 y.o. female with a hx of ASD s/p repair 2012, POTS, HTN, HLD, asthma, migraines, gastritis, hiatal hernia, scoliosis, obesity, hyperhidrosis, ADHD, PTSD, anxiety, depression, suicidal ideation here to follow up in the Advanced Hypertension Clinic.   Previously has followed with Dr. Fernande for palpitations and intermittent attacks of diaphoresis with shaking and cold hands with sensation of being flushed.  Thyroid  function normal.  Monitor November 2023 average heart rate of 109 bpm with rare PAC and PVC.  Prior [place] workup 2017-2018 with pediatric endocrinology at [facility]. Her brother has low cortisol and her mother has hypothyroidism.   Established with Dr. Raford and hypertension clinic 11/22/2022.  Her blood pressure was uncontrolled and bisoprolol  was increased to 10 mg. 12/01/22 cortisol 0.4.SABRA 24h urine normetanephrines 451 but otherwise unremarkable. CT abdomen 12/20/22 normal adrenal glands. Scattered mesenteric and right lower quadrant lymph nodes not pathologically enlarged, likely reactive. She was referred to endocrine.   Has been followed for hypertension at early age following ASD repair in 2012. She has had intermittent attacks worse since 2021 associated with diaphoresis, elevated heart rates, palpitations, dizziness, imbalance. Was diagnosed with POTS by her PCP at 23 years old. Echo 12/2022 LVEF 55-60%, mild LVH, no residual shunting across ASD repair. Her Bisoprolol  dose has been adjusted from 5mg  to 7.5mg  and back to 5mg  based on BP/HR readings.   Presents today for follow-up with her mother.  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 She has been  increasing fluid intake and including electrolytes through Body Armour, Gatorlyte, Liquid IV. Recently diagnosed with sleep apnea, awaiting next steps from her neurologist. Discussed the importance of treatment of sleep apnea. She did have a recent bout of bacterial sinusitis with subsequent palpitations and hypotension with BP such as 90/58. However, this has improved as she has felt better. Reports only mild residual sore throat. She does wake with dry mouth and throat feeling raw, encouraged trial of Biotene dry mouth rinse prior to bed.   Previous antihypertensives: Amlodipine  Lisinopril   Past Medical History:  Diagnosis Date   Acid reflux 2010   Acne vulgaris 2017   ADHD (attention deficit hyperactivity disorder) 2011   Adverse food reaction 07/11/2016   Shellfish allergy    Amenorrhea 2019   Anaphylactic shock due to adverse food reaction 01/15/2018   Anxiety 2017   ASD (atrial septal defect) 11/22/2022   Chronic constipation 09/05/2011   Chronic tension headaches 2011   originally followed by [facility] K.Griffin. Then Dr [facility] 03/2020 (see above)    Coalition, calcaneus navicular    Dysmenorrhea    Eczema    Eustachian tube dysfunction, left 10/23/2019   Frequent headaches    Galactorrhea    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 04/01/2019   Hiatal hernia    History of atrial septal defect repair 12/04/2011   Hyperlipidemia 2013   Hypertension 2011   Hypertension 09/28/2020   hypertension, cellulitus    Idiopathic urticaria 09/05/2011   Insomnia    Keratosis pilaris 01/15/2018   Mass of left thigh 11/22/2017   Overview:  Added automatically from request for surgery [id number]   MDD (major depressive  disorder), severe (HCC) 03/06/2018   Migraine 2011   re-diagnosed as chronic migraines 03/2020 [facility] - starting on preventative    Obesity without serious comorbidity with body mass index (BMI) in 95th to 98th percentile for age in pediatric patient 09/04/2018    PCOS (polycystic ovarian syndrome)    Piriformis syndrome    POTS (postural orthostatic tachycardia syndrome)    PTSD (post-traumatic stress disorder)    Scoliosis 2019   Seasonal and perennial allergic rhinitis 2010   Severe persistent asthma without complication 2010   Sleep apnea    Sleep paralysis    Suicidal ideation 03/07/2018   Thyroid  disease    underactive thyroid    Tic disorder 2019    Past Surgical History:  Procedure Laterality Date   ASD REPAIR  2012   with Helix, via Cardiac Catheterization   atrial septal defecgt     CARDIAC SURGERY     DENTAL SURGERY  2020   lipoma removal  2019   LUMBAR PUNCTURE  05/20/2021   TONSILLECTOMY AND ADENOIDECTOMY  2010    Current Medications: Current Meds  Medication Sig   AJOVY 225 MG/1.5ML SOAJ Inject 225 mg into the skin.   albuterol  (PROVENTIL ) (2.5 MG/3ML) 0.083% nebulizer solution Take 3 mLs (2.5 mg total) by nebulization every 6 (six) hours as needed for wheezing or shortness of breath.   amoxicillin -clavulanate (AUGMENTIN ) 875-125 MG tablet Take 1 tablet by mouth 2 (two) times daily.   ascorbic acid  (VITAMIN C ) 500 MG tablet Take 500 mg by mouth daily.   atomoxetine  (STRATTERA ) 60 MG capsule Take 1 capsule (60 mg total) by mouth daily.   Azelastine -Fluticasone  (DYMISTA ) 137-50 MCG/ACT SUSP PLACE 1 SPRAY INTO BOTH NOSTRILS 2 (TWO) TIMES DAILY   baclofen (LIORESAL) 10 MG tablet Take 10 mg by mouth 3 (three) times daily.   bisoprolol  (ZEBETA ) 5 MG tablet TAKE 1.5 TABLETS BY MOUTH DAILY.   budesonide -formoterol  (SYMBICORT ) 160-4.5 MCG/ACT inhaler Inhale 1 puff into the lungs daily.   busPIRone  (BUSPAR ) 10 MG tablet Take 1 tablet (10 mg total) by mouth 2 (two) times daily.   calcium  carbonate (OS-CAL) 1250 (500 Ca) MG chewable tablet Chew by mouth.   cariprazine  (VRAYLAR ) 1.5 MG capsule Take 1 capsule (1.5 mg total) by mouth daily.   Cholecalciferol 25 MCG (1000 UT) tablet Take 1 Units by mouth daily at 2 PM.   clobetasol   (TEMOVATE ) 0.05 % external solution Apply topically 2 (two) times daily as needed.   clonazePAM  (KLONOPIN ) 0.5 MG tablet Take 1 tablet (0.5 mg total) by mouth 2 (two) times daily as needed for anxiety.   dicyclomine (BENTYL) 10 MG capsule Take 10 mg by mouth as needed for spasms.   EPINEPHRINE  0.3 mg/0.3 mL IJ SOAJ injection INJECT 0.3 MG INTO THE MUSCLE AS NEEDED FOR ANAPHYLAXIS.   fexofenadine  (ALLEGRA ) 180 MG tablet TAKE 1 TABLET BY MOUTH EVERY DAY   hydrOXYzine  (VISTARIL ) 25 MG capsule Take 1-2 capsules (25-50 mg total) by mouth daily as needed.   levothyroxine  (SYNTHROID ) 50 MCG tablet Take 1 tablet (50 mcg total) by mouth daily before breakfast.   LINZESS 72 MCG capsule Take 72 mcg by mouth daily.   Loratadine  (CLARITIN  PO) Take by mouth.   montelukast  (SINGULAIR ) 10 MG tablet TAKE 1 TABLET BY MOUTH EVERY DAY   Multiple Vitamin (MULTIVITAMIN) capsule Take by mouth.   naproxen  (NAPROSYN ) 500 MG tablet Take 1 tablet (500 mg total) by mouth 2 (two) times daily with a meal. For painful periods (Patient  taking differently: Take 500 mg by mouth daily as needed for mild pain (pain score 1-3) or moderate pain (pain score 4-6). For painful periods)   norethindrone  (MICRONOR ) 0.35 MG tablet TAKE 1 TABLET BY MOUTH EVERY DAY   Olopatadine  HCl 0.2 % SOLN Apply 1 drop to eye every morning.   ondansetron (ZOFRAN-ODT) 4 MG disintegrating tablet DISSOLVE ON TONGUE AT ONSET OF NAUSEA- 30 DAY SUPPLY   pantoprazole  (PROTONIX ) 40 MG tablet Take 1 tablet by mouth in the morning and at bedtime.   Polyethylene Glycol 3350  (MIRALAX  PO) Take by mouth as needed.   predniSONE  (DELTASONE ) 20 MG tablet 2 po at same time daily for 5 days   RESTASIS 0.05 % ophthalmic emulsion 1 drop 2 (two) times daily.   RIZATRIPTAN BENZOATE PO Take by mouth. (Patient taking differently: Take by mouth as needed (mirgraines).)   sertraline  (ZOLOFT ) 50 MG tablet Take 1.5 tablets (75 mg total) by mouth daily.   topiramate (TOPAMAX) 50 MG  tablet Take 100 mg by mouth 2 (two) times daily.   VENTOLIN  HFA 108 (90 Base) MCG/ACT inhaler INHALE 2 PUFFS BY MOUTH EVERY 4 HOURS AS NEEDED FOR WHEEZE OR FOR SHORTNESS OF BREATH   [DISCONTINUED] metoprolol  tartrate (LOPRESSOR ) 25 MG tablet TAKE 1 TABLET (25 MG TOTAL) BY MOUTH AS NEEDED.   [DISCONTINUED] nitrofurantoin , macrocrystal-monohydrate, (MACROBID ) 100 MG capsule Take 100 mg by mouth 2 (two) times daily.     Allergies:   Brompheniramine, Glucosamine forte [nutritional supplements], Nutritional supplements, Oyster extract, Shellfish-derived products, Cats claw (uncaria tomentosa), Diamox [acetazolamide], Lactose, Lactose intolerance (gi), Topiramate, and Bromfed dm [pseudoeph-bromphen-dm]   Social History   Socioeconomic History   Marital status: Significant Other    Spouse name: Not on file   Number of children: Not on file   Years of education: Not on file   Highest education level: Not on file  Occupational History   Not on file  Tobacco Use   Smoking status: Never   Smokeless tobacco: Never  Vaping Use   Vaping status: Never Used  Substance and Sexual Activity   Alcohol  use: No    Alcohol /week: 0.0 standard drinks of alcohol    Drug use: No   Sexual activity: Never    Birth control/protection: Pill  Other Topics Concern   Not on file  Social History Narrative   Not on file   Social Drivers of Health   Financial Resource Strain: Low Risk  (07/04/2024)   Received from [facility]   Overall Financial Resource Strain (CARDIA)    How hard is it for you to pay for the very basics like food, housing, medical care, and heating?: Not hard at all  Food Insecurity: No Food Insecurity (07/04/2024)   Received from [facility]   Hunger Vital Sign    Within the past 12 months, you worried that your food would run out before you got the money to buy more.: Never true    Within the past 12 months, the food you bought just didn't last and you didn't have money to get more.:  Never true  Transportation Needs: No Transportation Needs (07/04/2024)   Received from [facility] - Transportation    Lack of Transportation (Medical): No    Lack of Transportation (Non-Medical): No  Physical Activity: Sufficiently Active (07/04/2024)   Received from [facility]   Exercise Vital Sign    On average, how many days per week do you engage in moderate to  strenuous exercise (like a brisk walk)?: 4 days    On average, how many minutes do you engage in exercise at this level?: 150+ min  Stress: No Stress Concern Present (07/04/2024)   Received from [facility] - Occupational Stress Questionnaire    Do you feel stress - tense, restless, nervous, or anxious, or unable to sleep at night because your mind is troubled all the time - these days?: Not at all  Social Connections: Socially Isolated (07/04/2024)   Received from [facility]   Social Connection and Isolation Panel    In a typical week, how many times do you talk on the phone with family, friends, or neighbors?: Twice a week    How often do you get together with friends or relatives?: Once a week    How often do you attend church or religious services?: Never    Do you belong to any clubs or organizations such as church groups, unions, fraternal or athletic groups, or school groups?: No    How often do you attend meetings of the clubs or organizations you belong to?: Never    Are you married, widowed, divorced, separated, never married, or living with a partner?: Never married     Family History: The patient's family history includes ADD / ADHD in her brother and father; Alcohol  abuse in her father; Allergic rhinitis in her brother and mother; Anxiety disorder in her mother; Arthritis in her mother; Asthma in her brother and mother; Bipolar disorder in her father, maternal grandfather, and mother; Depression in her mother; Diabetes in her mother; Drug abuse in her  father; Heart attack in her maternal grandfather; Heart disease in her maternal grandmother; Hyperlipidemia in her maternal grandmother and mother; Hypertension in her maternal grandmother and mother; Learning disabilities in her brother; Mental illness in her father and mother; Miscarriages / Stillbirths in her mother; Thyroid  disease in her mother. There is no history of Angioedema, Eczema, Immunodeficiency, or Urticaria.  ROS:   Please see the history of present illness.     All other systems reviewed and are negative.  EKGs/Labs/Other Studies Reviewed:         Recent Labs: 05/13/2024: ALT 18; BUN 14; Creatinine, Ser 0.86; Hemoglobin 13.3; Magnesium 2.1; Platelets 286; Potassium 4.6; Sodium 146; TSH 1.340   Recent Lipid Panel    Component Value Date/Time   CHOL 207 (H) 05/13/2024 1500   TRIG 139 05/13/2024 1500   HDL 49 05/13/2024 1500   CHOLHDL 4.2 05/13/2024 1500   CHOLHDL 3.3 03/07/2018 0713   VLDL 20 03/07/2018 0713   LDLCALC 133 (H) 05/13/2024 1500   Physical Exam:   VS:  BP 120/76 (BP Location: Right Arm, Patient Position: Sitting, Cuff Size: Large)   Pulse (!) 104   Ht 4' 11 (1.499 m)   Wt 226 lb 9.6 oz (102.8 kg)   SpO2 97%   BMI 45.77 kg/m  , BMI Body mass index is 45.77 kg/m. GENERAL:  Well appearing, overweight HEENT: Pupils equal round and reactive, fundi not visualized, oral mucosa unremarkable NECK:  No jugular venous distention, waveform within normal limits, carotid upstroke brisk and symmetric, no bruits, no thyromegaly LYMPHATICS:  No cervical adenopathy LUNGS:  Clear to auscultation bilaterally HEART:  RRR.  PMI not displaced or sustained,S1 and S2 within normal limits, no S3, no S4, no clicks, no rubs, no murmurs ABD:  Flat, positive bowel sounds normal in frequency in pitch, no bruits, no  rebound, no guarding, no midline pulsatile mass, no hepatomegaly, no splenomegaly EXT:  2 plus pulses throughout, no edema, no cyanosis no clubbing SKIN:  No rashes  no nodules NEURO:  Cranial nerves II through XII grossly intact, motor grossly intact throughout PSYCH:  Cognitively intact, oriented to person place and time   ASSESSMENT/PLAN:    HTN - BP at goal <130/80. Continue Bisoprolol  5mg  daily.   Spells with flushing episodes / Palpitations / Autonomic dysautonomia / Orthostatic intolerance / Tachycardia - Echo 12/2022 normal LVEF, ASD closure functioning appropriately. Likely element of autonomic dysfunction.Continue increased fluid intake aiming for 80 oz per day and supplemental electrolytes. . Recommend recumbent exercise as likely more tolerable than exercising outside in the heat.  OSA - Recent diagnosis by neurology. Awaiting next steps from her neurology team.   Obesity - Weight loss via diet and exercise encouraged. Discussed the impact being overweight would have on cardiovascular risk. Congratulated on weight loss.   Screening for Secondary Hypertension:     11/22/2022    3:55 PM 12/31/2022    8:11 PM  Causes  Drugs/Herbals Screened      - Comments No caffeine or energy drinks.   Sleep Apnea  Screened     - Comments  pending workup with pulmonology  Thyroid  Disease  Screened     - Comments  normal TSH 10/2022  Pheochromocytoma  Screened     - Comments  12/2022 CT no adrenal nodules  Cushing's Syndrome  Screened     - Comments  11/2022 normal cortisol  Compliance  Screened    Relevant Labs/Studies:    Latest Ref Rng & Units 05/13/2024    3:00 PM 02/08/2024   10:55 AM 05/18/2023   12:08 PM  Basic Labs  Sodium 134 - 144 mmol/L 146  147  144   Potassium 3.5 - 5.2 mmol/L 4.6  4.6  3.7   Creatinine 0.57 - 1.00 mg/dL 9.13  9.16  9.21        Latest Ref Rng & Units 05/13/2024    3:00 PM 02/08/2024   10:55 AM  Thyroid    TSH 0.450 - 4.500 uIU/mL 1.340  2.200           Latest Ref Rng & Units 10/05/2023   11:00 AM 05/29/2023   10:30 AM  Metanephrines/Catecholamines   Metanephrines 0.0 - 88.0 pg/mL <25.0  <25.0   Normetanephrines   0.0 - 210.1 pg/mL 66.8  58.4            Disposition:    FU with MD/PharmD/APP in 6 months    Medication Adjustments/Labs and Tests Ordered: Current medicines are reviewed at length with the patient today.  Concerns regarding medicines are outlined above.  No orders of the defined types were placed in this encounter.  No orders of the defined types were placed in this encounter.    Signed, Reche GORMAN Finder, NP  07/31/2024 4:11 PM    [facility]

## 2024-07-31 NOTE — Patient Instructions (Signed)
 Medication Instructions:  Continue your current medications  *If you need a refill on your cardiac medications before your next appointment, please call your pharmacy*  Follow-Up: At [facility], you and your health needs are our priority.  As part of our continuing mission to provide you with exceptional heart care, our providers are all part of one team.  This team includes your primary Cardiologist (physician) and Advanced Practice Providers or APPs (Physician Assistants and Nurse Practitioners) who all work together to provide you with the care you need, when you need it.  Your next appointment:   6 month(s) in Advanced Hypertension Clinic with Dr. Raford or Reche GORMAN Finder, NP   We recommend signing up for the patient portal called MyChart.  Sign up information is provided on this After Visit Summary.  MyChart is used to connect with patients for Virtual Visits (Telemedicine).  Patients are able to view lab/test results, encounter notes, upcoming appointments, etc.  Non-urgent messages can be sent to your provider as well.   To learn more about what you can do with MyChart, go to [email or web address].   Other Instructions  Can try biotene dry mouth rinse at night before bed      Postural Orthostatic Tachycardia Syndrome:  We spent time today discussing the etiology, diagnosis, and symptom management of POTS. The following recommendations were emphasized:  -avoid dehydration. Often it requires high volumes of fluids, often with salt/electrolytes included, to stay hydrated. People with POTS are very sensitive to fluid shifts and dehydration. Oral rehydration is preferred, and routine use of IV fluids is not recommended. -if tolerated, compression garments can assist with fluid management and prevent blood pooling. Compression stockings are a start, but some people need thigh high compression stockings and abdominal compression to help manage symptoms. -slow position  changes are recommended -if there is a feeling of severe lightheadedness, like near to passing out, recommend lying on the floor on the back, with legs elevated up on a chair or up against the wall. -the best long term management of POTS symptoms is gradual exercise conditioning. I recommend seated exercises such as bike to start, to avoid the risk of falling with lightheadedness. Exercise programs, either through supervised programs like cardiac rehab or through personal programs, should focus on gradually increasing exercise tolerance and conditioning.  -this is a link to specific exercise recommendations for POTS: http://peterson-powell.net/ -we discussed the typical spectrum of dysautonomia, including typical populations, that this sometimes spontaneously improves with age (though a small percentage have persistent symptoms), that this has uncomfortable symptoms but is not associated with long term mortality, and that the etiology/treatment of this is an area of active research -we discussed that there are many resources available on the internet, but much of this information is not researched based. I recommend kdxobr.com as a reliable web site for information on POTS.

## 2024-08-03 DIAGNOSIS — J4550 Severe persistent asthma, uncomplicated: Secondary | ICD-10-CM | POA: Diagnosis not present

## 2024-08-05 ENCOUNTER — Encounter (HOSPITAL_COMMUNITY): Payer: Self-pay | Admitting: Psychiatry

## 2024-08-05 ENCOUNTER — Telehealth (INDEPENDENT_AMBULATORY_CARE_PROVIDER_SITE_OTHER): Payer: MEDICAID | Admitting: Psychiatry

## 2024-08-05 DIAGNOSIS — F333 Major depressive disorder, recurrent, severe with psychotic symptoms: Secondary | ICD-10-CM

## 2024-08-05 DIAGNOSIS — F902 Attention-deficit hyperactivity disorder, combined type: Secondary | ICD-10-CM | POA: Diagnosis not present

## 2024-08-05 MED ORDER — CARIPRAZINE HCL 1.5 MG PO CAPS: 1.5000 mg | ORAL_CAPSULE | Freq: Every day | ORAL | 2 refills | Status: DC

## 2024-08-05 MED ORDER — ATOMOXETINE HCL 60 MG PO CAPS: 60.0000 mg | ORAL_CAPSULE | Freq: Every day | ORAL | 1 refills | Status: DC

## 2024-08-05 MED ORDER — SERTRALINE HCL 50 MG PO TABS: 75.0000 mg | ORAL_TABLET | Freq: Every day | ORAL | 1 refills | Status: DC

## 2024-08-05 MED ORDER — BUSPIRONE HCL 10 MG PO TABS: 10.0000 mg | ORAL_TABLET | Freq: Two times a day (BID) | ORAL | 2 refills | Status: DC

## 2024-08-05 MED ORDER — HYDROXYZINE PAMOATE 25 MG PO CAPS: 25.0000 mg | ORAL_CAPSULE | Freq: Every day | ORAL | 1 refills | Status: DC | PRN

## 2024-08-05 NOTE — Progress Notes (Signed)
 Virtual Visit via Video Note  I connected with Molly [name] on 08/05/24 at 11:20 AM EDT by a video enabled telemedicine application and verified that I am speaking with the correct person using two identifiers.  Location: Patient: home Provider: office   I discussed the limitations of evaluation and management by telemedicine and the availability of in person appointments. The patient expressed understanding and agreed to proceed.     I discussed the assessment and treatment plan with the patient. The patient was provided an opportunity to ask questions and all were answered. The patient agreed with the plan and demonstrated an understanding of the instructions.   The patient was advised to call back or seek an in-person evaluation if the symptoms worsen or if the condition fails to improve as anticipated.  I provided 20 minutes of non-face-to-face time during this encounter.   Barnie Gull, MD  Virtual Visit via Video Note  I connected with Molly [name] on 08/05/24 at 11:20 AM EDT by a video enabled telemedicine application and verified that I am speaking with the correct person using two identifiers.  Location: Patient: home Provider: office   I discussed the limitations of evaluation and management by telemedicine and the availability of in person appointments. The patient expressed understanding and agreed to proceed.    I discussed the assessment and treatment plan with the patient. The patient was provided an opportunity to ask questions and all were answered. The patient agreed with the plan and demonstrated an understanding of the instructions.   The patient was advised to call back or seek an in-person evaluation if the symptoms worsen or if the condition fails to improve as anticipated.  I provided 20 minutes of non-face-to-face time during this encounter.   Barnie Gull, MD  [facility] MD/PA/NP OP Progress Note  08/05/2024 11:41 AM Molly [name]   MRN:  [id number]  Chief Complaint:  Chief Complaint  Patient presents with   Depression   Anxiety   Follow-up   ADD   HPI: This patient is a 23 year old female lives with her mother mother's fianc and a younger brother in [place]. 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.   The patient returns for follow-up after 3 months regarding her depression anxiety and ADD.  She is still enjoying her job.  However she is still dealing with the POTS and at times has passed out.  She is trying to stay as hydrated as possible.  Otherwise her health has been stable.  She denies significant depression anxiety thoughts of self-harm or mood swings.  She had met a boyfriend online and they were together for 7 months and he abruptly left her over the summer.  She states she did go through a hard time after that but she feels like she is through it now. Visit Diagnosis:    ICD-10-CM   1. Severe episode of recurrent major depressive disorder, with psychotic features (HCC)  F33.3     2. Attention deficit hyperactivity disorder (ADHD), combined type  F90.2       Past Psychiatric History: Past psychiatric admissions at a younger age and 2 years of therapy [facility]  Past Medical History:  Past Medical History:  Diagnosis Date   Acid reflux 2010   Acne vulgaris 2017   ADHD (attention deficit hyperactivity disorder) 2011   Adverse food reaction 07/11/2016   Shellfish allergy    Amenorrhea 2019  Anaphylactic shock due to adverse food reaction 01/15/2018   Anxiety 2017   ASD (atrial septal defect) 11/22/2022   Chronic constipation 09/05/2011   Chronic tension headaches 2011   originally followed by [facility] K.Griffin. Then Dr Tonette [facility] 03/2020 (see above)    Coalition, calcaneus navicular    Dysmenorrhea    Eczema    Eustachian tube dysfunction, left 10/23/2019   Frequent headaches    Galactorrhea    Gastritis  determined by biopsy 04/01/2019   Hiatal hernia    History of atrial septal defect repair 12/04/2011   Hyperlipidemia 2013   Hypertension 2011   Hypertension 09/28/2020   hypertension, cellulitus    Idiopathic urticaria 09/05/2011   Insomnia    Keratosis pilaris 01/15/2018   Mass of left thigh 11/22/2017   Overview:  Added automatically from request for surgery [id number]   MDD (major depressive disorder), severe (HCC) 03/06/2018   Migraine 2011   re-diagnosed as chronic migraines 03/2020 [facility] - starting on preventative    Obesity without serious comorbidity with body mass index (BMI) in 95th to 98th percentile for age in pediatric patient 09/04/2018   PCOS (polycystic ovarian syndrome)    Piriformis syndrome    POTS (postural orthostatic tachycardia syndrome)    PTSD (post-traumatic stress disorder)    Scoliosis 2019   Seasonal and perennial allergic rhinitis 2010   Severe persistent asthma without complication 2010   Sleep apnea    Sleep paralysis    Suicidal ideation 03/07/2018   Thyroid  disease    underactive thyroid    Tic disorder 2019    Past Surgical History:  Procedure Laterality Date   ASD REPAIR  2012   with Helix, via Cardiac Catheterization   atrial septal defecgt     CARDIAC SURGERY     DENTAL SURGERY  2020   lipoma removal  2019   LUMBAR PUNCTURE  05/20/2021   TONSILLECTOMY AND ADENOIDECTOMY  2010    Family Psychiatric History: See below  Family History:  Family History  Problem Relation Age of Onset   Hypertension Mother    Hyperlipidemia Mother    Diabetes Mother    Depression Mother    Arthritis Mother    Allergic rhinitis Mother    Asthma Mother    Bipolar disorder Mother    Anxiety disorder Mother    Mental illness Mother    Miscarriages / [place] Mother    Thyroid  disease Mother    Bipolar disorder Father    Drug abuse Father    Alcohol  abuse Father    ADD / ADHD Father    Mental illness Father    Allergic rhinitis Brother     Asthma Brother    ADD / ADHD Brother    Learning disabilities Brother    Hypertension Maternal Grandmother    Hyperlipidemia Maternal Grandmother    Heart disease Maternal Grandmother    Heart attack Maternal Grandfather    Bipolar disorder Maternal Grandfather    Angioedema Neg Hx    Eczema Neg Hx    Immunodeficiency Neg Hx    Urticaria Neg Hx     Social History:  Social History   Socioeconomic History   Marital status: Significant Other    Spouse name: Not on file   Number of children: Not on file   Years of education: Not on file   Highest education level: Not on file  Occupational History   Not on file  Tobacco Use   Smoking status: Never  Smokeless tobacco: Never  Vaping Use   Vaping status: Never Used  Substance and Sexual Activity   Alcohol  use: No    Alcohol /week: 0.0 standard drinks of alcohol    Drug use: No   Sexual activity: Never    Birth control/protection: Pill  Other Topics Concern   Not on file  Social History Narrative   Not on file   Social Drivers of Health   Financial Resource Strain: Low Risk  (07/04/2024)   Received from [facility]   Overall Financial Resource Strain (CARDIA)    How hard is it for you to pay for the very basics like food, housing, medical care, and heating?: Not hard at all  Food Insecurity: No Food Insecurity (07/04/2024)   Received from [facility]   Hunger Vital Sign    Within the past 12 months, you worried that your food would run out before you got the money to buy more.: Never true    Within the past 12 months, the food you bought just didn't last and you didn't have money to get more.: Never true  Transportation Needs: No Transportation Needs (07/04/2024)   Received from [facility] - Transportation    Lack of Transportation (Medical): No    Lack of Transportation (Non-Medical): No  Physical Activity: Sufficiently Active (07/04/2024)   Received from [facility]   Exercise Vital Sign    On  average, how many days per week do you engage in moderate to strenuous exercise (like a brisk walk)?: 4 days    On average, how many minutes do you engage in exercise at this level?: 150+ min  Stress: No Stress Concern Present (07/04/2024)   Received from [facility] - Occupational Stress Questionnaire    Do you feel stress - tense, restless, nervous, or anxious, or unable to sleep at night because your mind is troubled all the time - these days?: Not at all  Social Connections: Socially Isolated (07/04/2024)   Received from [facility]   Social Connection and Isolation Panel    In a typical week, how many times do you talk on the phone with family, friends, or neighbors?: Twice a week    How often do you get together with friends or relatives?: Once a week    How often do you attend church or religious services?: Never    Do you belong to any clubs or organizations such as church groups, unions, fraternal or athletic groups, or school groups?: No    How often do you attend meetings of the clubs or organizations you belong to?: Never    Are you married, widowed, divorced, separated, never married, or living with a partner?: Never married    Allergies:  Allergies  Allergen Reactions   Brompheniramine Swelling and Anxiety   Glucosamine Forte [Nutritional Supplements] Anaphylaxis    Due to containing shellfish   Nutritional Supplements Anaphylaxis   Oyster Extract Anaphylaxis   Shellfish-Derived Products Anaphylaxis    Throat swelling   Cats Claw (Uncaria Tomentosa) Nausea And Vomiting   Diamox [Acetazolamide] Other (See Comments)    Numbness- feet, hands, lips   Lactose    Lactose Intolerance (Gi)    Topiramate Other (See Comments)    Numbness to face, hands and feet   Bromfed Dm [Pseudoeph-Bromphen-Dm] Anxiety    CARBOFED DM    Metabolic Disorder Labs: Lab Results  Component Value Date   HGBA1C  5.0 05/13/2024   MPG 105.41  03/07/2018   Lab Results  Component Value Date   PROLACTIN 49.7 (H) 03/07/2018   Lab Results  Component Value Date   CHOL 207 (H) 05/13/2024   TRIG 139 05/13/2024   HDL 49 05/13/2024   CHOLHDL 4.2 05/13/2024   VLDL 20 03/07/2018   LDLCALC 133 (H) 05/13/2024   LDLCALC 124 (H) 02/08/2024   Lab Results  Component Value Date   TSH 1.340 05/13/2024   TSH 2.200 02/08/2024    Therapeutic Level Labs: No results found for: LITHIUM No results found for: VALPROATE No results found for: CBMZ  Current Medications: Current Outpatient Medications  Medication Sig Dispense Refill   AJOVY 225 MG/1.5ML SOAJ Inject 225 mg into the skin.     albuterol  (PROVENTIL ) (2.5 MG/3ML) 0.083% nebulizer solution Take 3 mLs (2.5 mg total) by nebulization every 6 (six) hours as needed for wheezing or shortness of breath. 75 mL 1   amoxicillin -clavulanate (AUGMENTIN ) 875-125 MG tablet Take 1 tablet by mouth 2 (two) times daily. 20 tablet 0   ascorbic acid  (VITAMIN C ) 500 MG tablet Take 500 mg by mouth daily.     atomoxetine  (STRATTERA ) 60 MG capsule Take 1 capsule (60 mg total) by mouth daily. 90 capsule 1   Azelastine -Fluticasone  (DYMISTA ) 137-50 MCG/ACT SUSP PLACE 1 SPRAY INTO BOTH NOSTRILS 2 (TWO) TIMES DAILY 23 g 5   baclofen (LIORESAL) 10 MG tablet Take 10 mg by mouth 3 (three) times daily.     bisoprolol  (ZEBETA ) 5 MG tablet Take 1 tablet (5 mg total) by mouth daily. 90 tablet 2   budesonide -formoterol  (SYMBICORT ) 160-4.5 MCG/ACT inhaler Inhale 1 puff into the lungs daily. 10.2 g 5   busPIRone  (BUSPAR ) 10 MG tablet Take 1 tablet (10 mg total) by mouth 2 (two) times daily. 180 tablet 2   calcium  carbonate (OS-CAL) 1250 (500 Ca) MG chewable tablet Chew by mouth.     cariprazine  (VRAYLAR ) 1.5 MG capsule Take 1 capsule (1.5 mg total) by mouth daily. 30 capsule 2   Cholecalciferol 25 MCG (1000 UT) tablet Take 1 Units by mouth daily at 2 PM.     clobetasol  (TEMOVATE ) 0.05 % external solution Apply  topically 2 (two) times daily as needed. 50 mL 3   clonazePAM  (KLONOPIN ) 0.5 MG tablet Take 1 tablet (0.5 mg total) by mouth 2 (two) times daily as needed for anxiety. 60 tablet 2   dicyclomine (BENTYL) 10 MG capsule Take 10 mg by mouth as needed for spasms.     EPINEPHRINE  0.3 mg/0.3 mL IJ SOAJ injection INJECT 0.3 MG INTO THE MUSCLE AS NEEDED FOR ANAPHYLAXIS. 2 each 2   fexofenadine  (ALLEGRA ) 180 MG tablet TAKE 1 TABLET BY MOUTH EVERY DAY 90 tablet 1   hydrOXYzine  (VISTARIL ) 25 MG capsule Take 1-2 capsules (25-50 mg total) by mouth daily as needed. 180 capsule 1   levothyroxine  (SYNTHROID ) 50 MCG tablet Take 1 tablet (50 mcg total) by mouth daily before breakfast. 90 tablet 1   LINZESS 72 MCG capsule Take 72 mcg by mouth daily.     Loratadine  (CLARITIN  PO) Take by mouth.     montelukast  (SINGULAIR ) 10 MG tablet TAKE 1 TABLET BY MOUTH EVERY DAY 90 tablet 1   Multiple Vitamin (MULTIVITAMIN) capsule Take by mouth.     naproxen  (NAPROSYN ) 500 MG tablet Take 1 tablet (500 mg total) by mouth 2 (two) times daily with a meal. For painful periods (Patient taking differently: Take 500 mg by mouth daily  as needed for mild pain (pain score 1-3) or moderate pain (pain score 4-6). For painful periods) 60 tablet 3   norethindrone  (MICRONOR ) 0.35 MG tablet TAKE 1 TABLET BY MOUTH EVERY DAY 84 tablet 3   Olopatadine  HCl 0.2 % SOLN Apply 1 drop to eye every morning.     ondansetron (ZOFRAN-ODT) 4 MG disintegrating tablet DISSOLVE ON TONGUE AT ONSET OF NAUSEA- 30 DAY SUPPLY 10 tablet 1   pantoprazole  (PROTONIX ) 40 MG tablet Take 1 tablet by mouth in the morning and at bedtime.     Polyethylene Glycol 3350  (MIRALAX  PO) Take by mouth as needed.     predniSONE  (DELTASONE ) 20 MG tablet 2 po at same time daily for 5 days 10 tablet 0   RESTASIS 0.05 % ophthalmic emulsion 1 drop 2 (two) times daily.     RIZATRIPTAN BENZOATE PO Take by mouth. (Patient taking differently: Take by mouth as needed (mirgraines).)      sertraline  (ZOLOFT ) 50 MG tablet Take 1.5 tablets (75 mg total) by mouth daily. 135 tablet 1   topiramate (TOPAMAX) 50 MG tablet Take 100 mg by mouth 2 (two) times daily.     VENTOLIN  HFA 108 (90 Base) MCG/ACT inhaler INHALE 2 PUFFS BY MOUTH EVERY 4 HOURS AS NEEDED FOR WHEEZE OR FOR SHORTNESS OF BREATH 18 each 1   No current facility-administered medications for this visit.     Musculoskeletal: Strength & Muscle Tone: within normal limits Gait & Station: normal Patient leans: N/A  Psychiatric Specialty Exam: Review of Systems  Neurological:  Positive for weakness.  All other systems reviewed and are negative.   There were no vitals taken for this visit.There is no height or weight on file to calculate BMI.  General Appearance: Casual and Fairly Groomed  Eye Contact:  Good  Speech:  Clear and Coherent  Volume:  Normal  Mood:  Euthymic  Affect:  Congruent  Thought Process:  Goal Directed  Orientation:  Full (Time, Place, and Person)  Thought Content: WDL   Suicidal Thoughts:  No  Homicidal Thoughts:  No  Memory:  Immediate;   Good Recent;   Good Remote;   NA  Judgement:  Good  Insight:  Good  Psychomotor Activity:  Normal  Concentration:  Concentration: Good and Attention Span: Good  Recall:  Good  Fund of Knowledge: Good  Language: Good  Akathisia:  No  Handed:  Right  AIMS (if indicated): not done  Assets:  Communication Skills Desire for Improvement Resilience Social Support Talents/Skills  ADL's:  Intact  Cognition: WNL  Sleep:  Good   Screenings: GAD-7    Flowsheet Row Office Visit from 05/13/2024 in [facility] Office Visit from 10/29/2023 in [facility] Office Visit from 09/14/2023 in [facility] Office Visit from 08/01/2023 in [facility] Office Visit from 05/18/2023 in [facility]   Total GAD-7 Score 0 0 0 1 0   PHQ2-9    Flowsheet Row Office Visit from 07/25/2024 in [facility] Office Visit from 05/13/2024 in [facility] Office Visit from 04/04/2024 in [facility] Office Visit from 10/29/2023 in [facility] Office Visit from 09/14/2023 in [facility]  PHQ-2 Total Score 0 0 0 0 0  PHQ-9 Total Score -- 2 -- 1 0   Flowsheet  Row Video Visit from 08/22/2022 in [facility] [place] Video Visit from 06/23/2022 in [facility] at [place] Video Visit from 04/19/2022 in [facility] at [place]  C-SSRS RISK CATEGORY No Risk No Risk No Risk     Assessment and Plan: This patient is a 23 year old female with a history of depression anxiety mood swings fibromyalgia chronic fatigue POTS and ADD.  She feels like her depression anxiety and focus are well-controlled.  She will continue Strattera  60 mg every morning for ADD, Vraylar  1.5 mg daily for mood stabilization, Zoloft  75 mg daily for depression and BuSpar  10 mg twice daily for anxiety.  She occasionally uses hydroxyzine  for anxiety or headaches.  She is rarely using clonazepam .  She will return to see me in 3 months  Collaboration of Care: Collaboration of Care: Primary Care Provider AEB notes are shared with PCP on the epic system  Patient/Guardian was advised Release of Information must be obtained prior to any record release in order to collaborate their care with an outside provider. Patient/Guardian was advised if they have not already done so to contact the registration department to sign all necessary forms in order for us  to release information regarding their care.   Consent: Patient/Guardian gives verbal consent for treatment and assignment of benefits for services  provided during this visit. Patient/Guardian expressed understanding and agreed to proceed.    Barnie Gull, MD 08/05/2024, 11:41 AM

## 2024-08-12 ENCOUNTER — Encounter: Payer: Self-pay | Admitting: Family Medicine

## 2024-08-15 ENCOUNTER — Other Ambulatory Visit: Payer: MEDICAID

## 2024-08-16 LAB — LIPID PANEL
Chol/HDL Ratio: 4.6 ratio — ABNORMAL HIGH (ref 0.0–4.4)
Cholesterol, Total: 198 mg/dL (ref 100–199)
HDL: 43 mg/dL (ref >39)
LDL Chol Calc (NIH): 128 mg/dL — ABNORMAL HIGH (ref 0–99)
Triglycerides: 148 mg/dL (ref 0–149)
VLDL Cholesterol Cal: 27 mg/dL (ref 5–40)

## 2024-08-16 LAB — COMPREHENSIVE METABOLIC PANEL WITH GFR
ALT: 22 IU/L (ref 0–32)
AST: 20 IU/L (ref 0–40)
Albumin: 4 g/dL (ref 4.0–5.0)
Alkaline Phosphatase: 81 IU/L (ref 44–121)
BUN/Creatinine Ratio: 15 (ref 9–23)
BUN: 11 mg/dL (ref 6–20)
Bilirubin Total: 0.2 mg/dL (ref 0.0–1.2)
CO2: 18 mmol/L — ABNORMAL LOW (ref 20–29)
Calcium: 9.1 mg/dL (ref 8.7–10.2)
Chloride: 113 mmol/L — ABNORMAL HIGH (ref 96–106)
Creatinine, Ser: 0.71 mg/dL (ref 0.57–1.00)
Globulin, Total: 2.1 g/dL (ref 1.5–4.5)
Glucose: 93 mg/dL (ref 70–99)
Potassium: 4.3 mmol/L (ref 3.5–5.2)
Sodium: 145 mmol/L — ABNORMAL HIGH (ref 134–144)
Total Protein: 6.1 g/dL (ref 6.0–8.5)
eGFR: 124 mL/min/1.73 (ref >59)

## 2024-08-16 LAB — TSH: TSH: <0.005 u[IU]/mL — ABNORMAL LOW (ref 0.450–4.500)

## 2024-08-16 LAB — T4, FREE: Free T4: 1.58 ng/dL (ref 0.82–1.77)

## 2024-08-19 ENCOUNTER — Encounter: Payer: Self-pay | Admitting: "Endocrinology

## 2024-08-19 ENCOUNTER — Ambulatory Visit (INDEPENDENT_AMBULATORY_CARE_PROVIDER_SITE_OTHER): Payer: MEDICAID | Admitting: "Endocrinology

## 2024-08-19 ENCOUNTER — Encounter: Payer: Self-pay | Admitting: Allergy & Immunology

## 2024-08-19 VITALS — BP 96/62 | HR 96 | Ht 59.0 in | Wt 228.0 lb

## 2024-08-19 DIAGNOSIS — R7303 Prediabetes: Secondary | ICD-10-CM | POA: Diagnosis not present

## 2024-08-19 DIAGNOSIS — E039 Hypothyroidism, unspecified: Secondary | ICD-10-CM

## 2024-08-19 DIAGNOSIS — E782 Mixed hyperlipidemia: Secondary | ICD-10-CM | POA: Diagnosis not present

## 2024-08-19 LAB — POCT GLYCOSYLATED HEMOGLOBIN (HGB A1C): HbA1c, POC (prediabetic range): 5.1 % — AB (ref 5.7–6.4)

## 2024-08-19 MED ORDER — LEVOTHYROXINE SODIUM 25 MCG PO TABS: 25.0000 ug | ORAL_TABLET | Freq: Every day | ORAL | 1 refills | Status: DC

## 2024-08-19 NOTE — Progress Notes (Signed)
 08/19/2024, 5:06 PM  Endocrinology follow-up note   Subjective:    Patient ID: Molly [name], female    DOB: 2001/08/16, PCP [name], FNP   Past Medical History:  Diagnosis Date   Acid reflux 2010   Acne vulgaris 2017   ADHD (attention deficit hyperactivity disorder) 2011   Adverse food reaction 07/11/2016   Shellfish allergy    Amenorrhea 2019   Anaphylactic shock due to adverse food reaction 01/15/2018   Anxiety 2017   ASD (atrial septal defect) 11/22/2022   Chronic constipation 09/05/2011   Chronic tension headaches 2011   originally followed by [facility] K.Griffin. Then Dr Ton[facility] 03/2020 (see above)    Coalition, calcaneus navicular    Dysmenorrhea    Eczema    Eustachian tube dysfunction, left 10/23/2019   Frequent headaches    Galactorrhea    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 04/01/2019   Hiatal hernia    History of atrial septal defect repair 12/04/2011   Hyperlipidemia 2013   Hypertension 2011   Hypertension 09/28/2020   hypertension, cellulitus    Idiopathic urticaria 09/05/2011   Insomnia    Keratosis pilaris 01/15/2018   Mass of left thigh 11/22/2017   Overview:  Added automatically from request for surgery [id number]   MDD (major depressive disorder), severe (HCC) 03/06/2018   Migraine 2011   re-diagnosed as chronic migraines 03/2020 [facility] - starting on preventative    Obesity without serious comorbidity with body mass index (BMI) in 95th to 98th percentile for age in pediatric patient 09/04/2018   PCOS (polycystic ovarian syndrome)    Piriformis syndrome    POTS (postural orthostatic tachycardia syndrome)    PTSD (post-traumatic stress disorder)    Scoliosis 2019   Seasonal and perennial allergic rhinitis 2010   Severe persistent asthma without complication 2010   Sleep apnea    Sleep paralysis    Suicidal ideation 03/07/2018   Thyroid  disease    underactive thyroid     Tic disorder 2019   Past Surgical History:  Procedure Laterality Date   ASD REPAIR  2012   with Helix, via Cardiac Catheterization   atrial septal defecgt     CARDIAC SURGERY     DENTAL SURGERY  2020   lipoma removal  2019   LUMBAR PUNCTURE  05/20/2021   TONSILLECTOMY AND ADENOIDECTOMY  2010   Social History   Socioeconomic History   Marital status: Significant Other    Spouse name: Not on file   Number of children: Not on file   Years of education: Not on file   Highest education level: Not on file  Occupational History   Not on file  Tobacco Use   Smoking status: Never   Smokeless tobacco: Never  Vaping Use   Vaping status: Never Used  Substance and Sexual Activity   Alcohol  use: No    Alcohol /week: 0.0 standard drinks of alcohol    Drug use: No   Sexual activity: Never    Birth control/protection: Pill  Other Topics Concern   Not on file  Social History Narrative   Not on file   Social Drivers of Health   Financial Resource Strain: Low Risk  (07/04/2024)   Received  from [facility]   Overall Financial Resource Strain (CARDIA)    How hard is it for you to pay for the very basics like food, housing, medical care, and heating?: Not hard at all  Food Insecurity: No Food Insecurity (07/04/2024)   Received from [facility]   Hunger Vital Sign    Within the past 12 months, you worried that your food would run out before you got the money to buy more.: Never true    Within the past 12 months, the food you bought just didn't last and you didn't have money to get more.: Never true  Transportation Needs: No Transportation Needs (07/04/2024)   Received from [facility] - Transportation    Lack of Transportation (Medical): No    Lack of Transportation (Non-Medical): No  Physical Activity: Sufficiently Active (07/04/2024)   Received from [facility]   Exercise Vital Sign    On average, how many days per week do you engage in moderate to strenuous  exercise (like a brisk walk)?: 4 days    On average, how many minutes do you engage in exercise at this level?: 150+ min  Stress: No Stress Concern Present (07/04/2024)   Received from [facility] - Occupational Stress Questionnaire    Do you feel stress - tense, restless, nervous, or anxious, or unable to sleep at night because your mind is troubled all the time - these days?: Not at all  Social Connections: Socially Isolated (07/04/2024)   Received from [facility]   Social Connection and Isolation Panel    In a typical week, how many times do you talk on the phone with family, friends, or neighbors?: Twice a week    How often do you get together with friends or relatives?: Once a week    How often do you attend church or religious services?: Never    Do you belong to any clubs or organizations such as church groups, unions, fraternal or athletic groups, or school groups?: No    How often do you attend meetings of the clubs or organizations you belong to?: Never    Are you married, widowed, divorced, separated, never married, or living with a partner?: Never married   Family History  Problem Relation Age of Onset   Hypertension Mother    Hyperlipidemia Mother    Diabetes Mother    Depression Mother    Arthritis Mother    Allergic rhinitis Mother    Asthma Mother    Bipolar disorder Mother    Anxiety disorder Mother    Mental illness Mother    Miscarriages / [place] Mother    Thyroid  disease Mother    Bipolar disorder Father    Drug abuse Father    Alcohol  abuse Father    ADD / ADHD Father    Mental illness Father    Allergic rhinitis Brother    Asthma Brother    ADD / ADHD Brother    Learning disabilities Brother    Hypertension Maternal Grandmother    Hyperlipidemia Maternal Grandmother    Heart disease Maternal Grandmother    Heart attack Maternal Grandfather    Bipolar disorder Maternal Grandfather    Angioedema Neg  Hx    Eczema Neg Hx    Immunodeficiency Neg Hx    Urticaria Neg Hx    Outpatient Encounter Medications as of 08/19/2024  Medication Sig   AJOVY 225 MG/1.5ML  SOAJ Inject 225 mg into the skin.   albuterol  (PROVENTIL ) (2.5 MG/3ML) 0.083% nebulizer solution Take 3 mLs (2.5 mg total) by nebulization every 6 (six) hours as needed for wheezing or shortness of breath.   amoxicillin -clavulanate (AUGMENTIN ) 875-125 MG tablet Take 1 tablet by mouth 2 (two) times daily. (Patient not taking: Reported on 08/19/2024)   ascorbic acid  (VITAMIN C ) 500 MG tablet Take 500 mg by mouth daily.   atomoxetine  (STRATTERA ) 60 MG capsule Take 1 capsule (60 mg total) by mouth daily.   Azelastine -Fluticasone  (DYMISTA ) 137-50 MCG/ACT SUSP PLACE 1 SPRAY INTO BOTH NOSTRILS 2 (TWO) TIMES DAILY   baclofen (LIORESAL) 10 MG tablet Take 10 mg by mouth 3 (three) times daily.   bisoprolol  (ZEBETA ) 5 MG tablet Take 1 tablet (5 mg total) by mouth daily.   budesonide -formoterol  (SYMBICORT ) 160-4.5 MCG/ACT inhaler Inhale 1 puff into the lungs daily.   busPIRone  (BUSPAR ) 10 MG tablet Take 1 tablet (10 mg total) by mouth 2 (two) times daily.   calcium  carbonate (OS-CAL) 1250 (500 Ca) MG chewable tablet Chew by mouth.   cariprazine  (VRAYLAR ) 1.5 MG capsule Take 1 capsule (1.5 mg total) by mouth daily.   Cholecalciferol 25 MCG (1000 UT) tablet Take 1 Units by mouth daily at 2 PM.   clobetasol  (TEMOVATE ) 0.05 % external solution Apply topically 2 (two) times daily as needed.   clonazePAM  (KLONOPIN ) 0.5 MG tablet Take 1 tablet (0.5 mg total) by mouth 2 (two) times daily as needed for anxiety.   dicyclomine (BENTYL) 10 MG capsule Take 10 mg by mouth as needed for spasms.   EPINEPHRINE  0.3 mg/0.3 mL IJ SOAJ injection INJECT 0.3 MG INTO THE MUSCLE AS NEEDED FOR ANAPHYLAXIS.   fexofenadine  (ALLEGRA ) 180 MG tablet TAKE 1 TABLET BY MOUTH EVERY DAY   hydrOXYzine  (VISTARIL ) 25 MG capsule Take 1-2 capsules (25-50 mg total) by mouth daily as needed.    levothyroxine  (SYNTHROID ) 25 MCG tablet Take 1 tablet (25 mcg total) by mouth daily before breakfast.   LINZESS 72 MCG capsule Take 72 mcg by mouth daily.   Loratadine  (CLARITIN  PO) Take by mouth.   montelukast  (SINGULAIR ) 10 MG tablet TAKE 1 TABLET BY MOUTH EVERY DAY   Multiple Vitamin (MULTIVITAMIN) capsule Take by mouth.   naproxen  (NAPROSYN ) 500 MG tablet Take 1 tablet (500 mg total) by mouth 2 (two) times daily with a meal. For painful periods (Patient taking differently: Take 500 mg by mouth daily as needed for mild pain (pain score 1-3) or moderate pain (pain score 4-6). For painful periods)   norethindrone  (MICRONOR ) 0.35 MG tablet TAKE 1 TABLET BY MOUTH EVERY DAY   Olopatadine  HCl 0.2 % SOLN Apply 1 drop to eye every morning.   ondansetron (ZOFRAN-ODT) 4 MG disintegrating tablet DISSOLVE ON TONGUE AT ONSET OF NAUSEA- 30 DAY SUPPLY   pantoprazole  (PROTONIX ) 40 MG tablet Take 1 tablet by mouth in the morning and at bedtime.   Polyethylene Glycol 3350  (MIRALAX  PO) Take by mouth as needed.   RESTASIS 0.05 % ophthalmic emulsion 1 drop 2 (two) times daily.   RIZATRIPTAN BENZOATE PO Take by mouth. (Patient taking differently: Take by mouth as needed (mirgraines).)   sertraline  (ZOLOFT ) 50 MG tablet Take 1.5 tablets (75 mg total) by mouth daily.   topiramate (TOPAMAX) 50 MG tablet Take 100 mg by mouth 2 (two) times daily.   VENTOLIN  HFA 108 (90 Base) MCG/ACT inhaler INHALE 2 PUFFS BY MOUTH EVERY 4 HOURS AS NEEDED FOR WHEEZE OR FOR SHORTNESS OF  BREATH   [DISCONTINUED] levothyroxine  (SYNTHROID ) 50 MCG tablet Take 1 tablet (50 mcg total) by mouth daily before breakfast.   [DISCONTINUED] predniSONE  (DELTASONE ) 20 MG tablet 2 po at same time daily for 5 days   No facility-administered encounter medications on file as of 08/19/2024.   ALLERGIES: Allergies  Allergen Reactions   Brompheniramine Swelling and Anxiety   Glucosamine Forte [Nutritional Supplements] Anaphylaxis    Due to containing  shellfish   Nutritional Supplements Anaphylaxis   Oyster Extract Anaphylaxis   Shellfish-Derived Products Anaphylaxis    Throat swelling   Cats Claw (Uncaria Tomentosa) Nausea And Vomiting   Diamox [Acetazolamide] Other (See Comments)    Numbness- feet, hands, lips   Lactose    Lactose Intolerance (Gi)    Topiramate Other (See Comments)    Numbness to face, hands and feet   Bromfed Dm [Pseudoeph-Bromphen-Dm] Anxiety    CARBOFED DM    VACCINATION STATUS: Immunization History  Administered Date(s) Administered   DTaP 09/24/2003, 12/10/2003, 02/16/2004, 11/01/2004, 07/23/2007, 07/09/2012   Dtap, Unspecified 11/01/2004, 07/23/2007   H1N1 10/07/2008, 11/12/2008   HIB (PRP-OMP) 09/24/2003, 12/10/2003, 02/16/2004, 11/01/2004, 07/23/2017   HIB (PRP-T) 09/24/2003, 12/10/2003, 02/16/2004   HIB, Unspecified 11/01/2004   HPV Quadrivalent 09/04/2013, 11/05/2013, 03/20/2014   Hep A, Unspecified 07/24/2006, 07/23/2007   Hep B, Unspecified 09/03/2003, 12/10/2003, 07/26/2004   IPV 09/24/2003, 12/10/2003, 11/01/2004, 07/23/2007   Influenza Nasal 08/10/2010, 09/03/2012, 09/04/2013, 09/14/2014   Influenza Split 09/05/2011, 09/05/2021   Influenza, Seasonal, Injecte, Preservative Fre 10/12/2005, 10/07/2008, 09/27/2009, 08/29/2023   Influenza,Quad,Nasal, Live 09/03/2012, 09/04/2013, 09/14/2014   Influenza,inj,Quad PF,6+ Mos 08/30/2015, 09/21/2016, 09/13/2017, 09/06/2018, 08/27/2019, 11/12/2020, 09/05/2021, 10/11/2022   Influenza,inj,quad, With Preservative 10/12/2018   Influenza-Unspecified 10/07/2008, 09/05/2011, 09/05/2011, 08/29/2023   MMR 07/26/2004, 07/23/2007   Meningococcal B, OMV 10/21/2019, 05/13/2020   Meningococcal Mcv4o 09/14/2014, 10/21/2019   Novel Infuenza-h1n1-09 11/12/2008   PFIZER Comirnaty(Gray Top)Covid-19 Tri-Sucrose Vaccine 04/01/2021, 05/06/2021   PPD Test 08/29/2023   Pfizer(Comirnaty)Fall Seasonal Vaccine 12 years and older 11/17/2022, 10/05/2023   Pneumococcal Conjugate  PCV 7 09/24/2003, 12/10/2003, 02/16/2004, 11/01/2004   Pneumococcal Polysaccharide-23 07/09/2012, 03/06/2022   Pneumococcal-Unspecified 09/24/2003, 02/16/2004, 11/01/2004, 12/09/2006   Polio, Unspecified 11/01/2004, 07/23/2007   Tdap 09/14/2014   Varicella 07/26/2004, 07/23/2007    HPI QUANIYA DAMAS is 23 y.o. female who presents today with a medical history as above. she is being seen in follow-up after she was seen in consultation for hypothyroidism.    She is accompanied by her mother to clinic.  She was recently diagnosed with mild hypothyroidism for which she was initiated on levothyroxine  currently 50 mcg p.o. daily before breakfast.  Her previsit labs are consistent with slight over replacement.    She does not have new complaints today.  She was exposed to high-dose steroids in the interval for sinus infections.    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   Because of her history of hyperhidrosis, she underwent several workup including 24-hour urine metanephrines measurements as well as adrenal CT scan which did not show any particular pathology.  Of note patient is on polypharmacy for multiple medical problems including mood disorders, bipolar disorders, anxiety disorders. Patient has been overweight/obese for most of her life including during her pediatric ages. She did not have any success in weight loss, wishes to achieve some weight loss.  She has prediabetes, PCOS, hyperlipidemia, hypertension, besides her mental illnesses.  Her recent point-of-care A1c today is 5.1% indicating reversal of her prediabetes. She is 23 years  old with 30+ medications on her list. Her active medications include Strattera , azelastine -fluticasone , bisoprolol  fumarate, Symbicort , buspirone , cariprazine , Klonopin , Valium, Benadryl, Allegra , Vistaril , Singulair , Zoloft , Imitrex, Topamax.  There is significant family history of obesity, type 2 diabetes,  hypertension, hyperlipidemia, asthma, thyroid  dysfunction, multiple allergy  conditions among others. She gives history of being suicidal in 2019, however the medications she is taking currently helped her regain her insight.  She is not suicidal anymore.  Patient is not following any particular diet or exercise program.  She has not engaged optimally to the lifestyle medicine we discussed last time. 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 She denies any exposure to heavy dose steroids, except in her bronchodilators.   Review of Systems  Constitutional: + Fluctuating Mydrilate, + fatigue, no subjective hyperthermia, no subjective hypothermia Eyes: no blurry vision, no xerophthalmia   Objective:       08/19/2024    3:05 PM 07/31/2024    3:48 PM 07/25/2024   10:10 AM  Vitals with BMI  Height 4' 11 4' 11 4' 11  Weight 228 lbs 226 lbs 10 oz 228 lbs  BMI 46.03 45.74 46.03  Systolic 96 120 90  Diastolic 62 76 58  Pulse 96 104 101    BP 96/62   Pulse 96   Ht 4' 11 (1.499 m)   Wt 228 lb (103.4 kg)   BMI 46.05 kg/m   Wt Readings from Last 3 Encounters:  08/19/24 228 lb (103.4 kg)  07/31/24 226 lb 9.6 oz (102.8 kg)  07/25/24 228 lb (103.4 kg)    Physical Exam  Constitutional:  Body mass index is 46.05 kg/m.,  not in acute distress, normal state of mind Eyes: PERRLA, EOMI, no exophthalmos ENT: moist mucous membranes, no gross thyromegaly, no gross cervical lymphadenopathy   CMP ( most recent) CMP     Component Value Date/Time   NA 145 (H) 08/15/2024 1056   K 4.3 08/15/2024 1056   CL 113 (H) 08/15/2024 1056   CO2 18 (L) 08/15/2024 1056   GLUCOSE 93 08/15/2024 1056   GLUCOSE 103 (H) 04/05/2018 1818   BUN 11 08/15/2024 1056   CREATININE 0.71 08/15/2024 1056   CALCIUM  9.1 08/15/2024 1056   PROT 6.1 08/15/2024 1056   ALBUMIN 4.0 08/15/2024 1056   AST 20 08/15/2024 1056   ALT 22 08/15/2024 1056   ALKPHOS 81 08/15/2024 1056   BILITOT 0.2 08/15/2024 1056    EGFR 124 08/15/2024 1056   GFRNONAA NOT CALCULATED 04/05/2018 1818     Diabetic Labs (most recent): Lab Results  Component Value Date   HGBA1C 5.1 (A) 08/19/2024   HGBA1C 5.0 05/13/2024   HGBA1C 5.1 10/11/2023   MICROALBUR 150 06/20/2023     Lipid Panel ( most recent) Lipid Panel     Component Value Date/Time   CHOL 198 08/15/2024 1056   TRIG 148 08/15/2024 1056   HDL 43 08/15/2024 1056   CHOLHDL 4.6 (H) 08/15/2024 1056   CHOLHDL 3.3 03/07/2018 0713   VLDL 20 03/07/2018 0713   LDLCALC 128 (H) 08/15/2024 1056   LABVLDL 27 08/15/2024 1056      Lab Results  Component Value Date   TSH <0.005 (L) 08/15/2024   TSH 1.340 05/13/2024   TSH 2.200 02/08/2024   TSH 3.730 10/05/2023   TSH 4.790 (H) 05/29/2023   FREET4 1.58 08/15/2024   FREET4 1.01 02/08/2024   FREET4 1.12 10/05/2023   FREET4 1.07 05/29/2023   FREET4 1.10 03/06/2022  Recent Results (from the past 2160 hours)  Rapid Strep Screen ([facility] ONLY)     Status: None   Collection Time: 07/25/24 10:13 AM   Specimen: Other   Other  Result Value Ref Range   Strep Gp A Ag, IA W/Reflex Negative Negative  Culture, Group A Strep     Status: None   Collection Time: 07/25/24 10:13 AM   Other  Result Value Ref Range   Strep A Culture CANCELED     Comment: Test not performed  Result canceled by the ancillary.   Novel Coronavirus, NAA ([facility])     Status: None   Collection Time: 07/25/24 11:23 AM   Specimen: Nasopharyngeal(NP) swabs in vial transport medium  Result Value Ref Range   SARS-CoV-2, NAA Not Detected Not Detected    Comment: This nucleic acid amplification test was developed and its performance characteristics determined by [employer]. Nucleic acid amplification tests include RT-PCR and TMA. This test has not been FDA cleared or approved. This test has been authorized by FDA under an Emergency Use Authorization (EUA). This test is only authorized for the duration of time the  declaration that circumstances exist justifying the authorization of the emergency use of in vitro diagnostic tests for detection of SARS-CoV-2 virus and/or diagnosis of COVID-19 infection under section 564(b)(1) of the Act, 21 U.S.C. 639aaa-6(a) (1), unless the authorization is terminated or revoked sooner. When diagnostic testing is negative, the possibility of a false negative result should be considered in the context of a patient's recent exposures and the presence of clinical signs and symptoms consistent with COVID-19. An individual without symptoms of COVID-19 and who is not shedding SARS-CoV-2 virus wo uld expect to have a negative (not detected) result in this assay.   Comprehensive metabolic panel     Status: Abnormal   Collection Time: 08/15/24 10:56 AM  Result Value Ref Range   Glucose 93 70 - 99 mg/dL   BUN 11 6 - 20 mg/dL   Creatinine, Ser 9.28 0.57 - 1.00 mg/dL   eGFR 875 >40 fO/fpw/8.26   BUN/Creatinine Ratio 15 9 - 23   Sodium 145 (H) 134 - 144 mmol/L   Potassium 4.3 3.5 - 5.2 mmol/L   Chloride 113 (H) 96 - 106 mmol/L   CO2 18 (L) 20 - 29 mmol/L   Calcium  9.1 8.7 - 10.2 mg/dL   Total Protein 6.1 6.0 - 8.5 g/dL   Albumin 4.0 4.0 - 5.0 g/dL   Globulin, Total 2.1 1.5 - 4.5 g/dL   Bilirubin Total 0.2 0.0 - 1.2 mg/dL   Alkaline Phosphatase 81 44 - 121 IU/L    Comment: **Effective August 18, 2024 Alkaline Phosphatase**   reference interval will be changing to:              Age                Female          Female           0 -  5 days         47 - 127       47 - 127           6 - 10 days         29 - 242       29 - 242          11 - 20 days        109 -  357      109 - 357          21 - 30 days         94 - 494       94 - 494           1 -  2 months      149 - 539      149 - 539           3 -  6 months      131 - 452      131 - 452           7 - 11 months      117 - 401      117 - 401   12 months -  6 years       158 - 369      158 - 369           7 - 12 years        150 - 409      150 - 409               13 years       156 - 435       78 - 227               14 years       114 - 375       64 - 161               15 years        88 - 279       56 - 134               16 years        74 - 207       51 - 121               17 years        63 - 161       47 - 113          18 - 20 years        51 - 125       42 - 106          21 - 50 years         47 - 123       41 - 116          51 - 80 years        49 - 135       51 - 125              >80 years        48 - 129       48 - 129    AST 20 0 - 40 IU/L   ALT 22 0 - 32 IU/L  Lipid panel     Status: Abnormal   Collection Time: 08/15/24 10:56 AM  Result Value Ref Range   Cholesterol, Total 198 100 - 199 mg/dL   Triglycerides 851 0 - 149 mg/dL   HDL 43 >60 mg/dL   VLDL Cholesterol Cal 27 5 - 40 mg/dL   LDL Chol Calc (NIH) 871 (H) 0 - 99 mg/dL   Chol/HDL Ratio 4.6 (H) 0.0 - 4.4 ratio    Comment:  T. Chol/HDL Ratio                                             Men  Women                               1/2 Avg.Risk  3.4    3.3                                   Avg.Risk  5.0    4.4                                2X Avg.Risk  9.6    7.1                                3X Avg.Risk 23.4   11.0   TSH     Status: Abnormal   Collection Time: 08/15/24 10:56 AM  Result Value Ref Range   TSH <0.005 (L) 0.450 - 4.500 uIU/mL  T4, free     Status: None   Collection Time: 08/15/24 10:56 AM  Result Value Ref Range   Free T4 1.58 0.82 - 1.77 ng/dL  HgB J8r     Status: Abnormal   Collection Time: 08/19/24  4:07 PM  Result Value Ref Range   Hemoglobin A1C     HbA1c POC (<> result, manual entry)     HbA1c, POC (prediabetic range) 5.1 (A) 5.7 - 6.4 %   HbA1c, POC (controlled diabetic range)       Assessment & Plan:  1.  Hypothyroidism 2. Prediabetes 3.  Morbid obesity 4.  Hypertension 5.  Hyperlipidemia   -Her previsit thyroid  function tests are consistent with slight over  replacement.  I discussed and lowered her levothyroxine  to 25 mcg p.o. daily before breakfast.    - We discussed about the correct intake of her thyroid  hormone, on empty stomach at fasting, with water, separated by at least 30 minutes from breakfast and other medications,  and separated by more than 4 hours from calcium , iron, multivitamins, acid reflux medications (PPIs). -Patient is made aware of the fact that thyroid  hormone replacement is needed for life, dose to be adjusted by periodic monitoring of thyroid  function tests.  -She was previously worked up and ruled out for pheochromocytoma.   Previsit free plasma metanephrines are normal.    -Her workup rules out Cushing's syndrome. She is vitamin D  replete. -She will be put on expectant management.  She has at least 8 medications with known side effects of hyperhidrosis including Strattera , azelastine -fluticasone , buspirone , cariprazine , Klonopin , Valium, Zoloft , Imitrex. -It seems like she is benefiting from this medications.  Unfortunately patient is on serious polypharmacy with 30+ medications at age 26.  Her medication list deserves to be revised and revisited by respective prescribers.  I advised her to address these concerns  during her next visits with her psychiatrist as well as primary care provider.  In light of her metabolic dysfunction with morbid obesity, PCOS, hyperlipidemia, hypertension, prediabetes, mental illness, this patient remains to be a good candidate for lifestyle medicine.    She is  interested to explore the GLP-1's to diabetes for weight management.  She is a good candidate for either Wegovy or Zepbound whichever is covered by her insurance. She will verify and call us  back for prescription.  In light of her PCOS, hyperlipidemia, hypertension, morbid obesity, she is still a good candidate for lifestyle medicine nutrition.  - she acknowledges that there is a room for improvement in her food and drink choices. -  Suggestion is made for her to avoid simple carbohydrates  from her diet including Cakes, Sweet Desserts, Ice Cream, Soda (diet and regular), Sweet Tea, Candies, Chips, Cookies, Store Bought Juices, Alcohol  , Artificial Sweeteners,  Coffee Creamer, and Sugar-free Products, Lemonade. This will help patient to have more stable blood glucose profile and potentially avoid unintended weight gain.  The following Lifestyle Medicine recommendations according to American College of Lifestyle Medicine  [facility]) were discussed and and offered to patient and she  agrees to start the journey:  A. Whole Foods, Plant-Based Nutrition comprising of fruits and vegetables, plant-based proteins, whole-grain carbohydrates was discussed in detail with the patient.   A list for source of those nutrients were also provided to the patient.  Patient will use only water or unsweetened tea for hydration. B.  The need to stay away from risky substances including alcohol , smoking; obtaining 7 to 9 hours of restorative sleep, at least 150 minutes of moderate intensity exercise weekly, the importance of healthy social connections,  and stress management techniques were discussed. C.  A full color page of  Calorie density of various food groups per pound showing examples of each food groups was provided to the patient.  - she is advised to maintain close follow up with Rakes, Rock HERO, FNP for primary care needs.   I spent  26  minutes in the care of the patient today including review of labs from Thyroid  Function, CMP, and other relevant labs ; imaging/biopsy records (current and previous including abstractions from other facilities); face-to-face time discussing  her lab results and symptoms, medications doses, her options of short and long term treatment based on the latest standards of care / guidelines;   and documenting the encounter.  Molly [name]  participated in the discussions, expressed understanding, and voiced  agreement with the above plans.  All questions were answered to her satisfaction. she is encouraged to contact clinic should she have any questions or concerns prior to her return visit.   Follow up plan: Return in about 3 months (around 11/18/2024) for F/U with no Labs.   Ranny Earl, MD [facility] [address] [place], [place] [address] Phone: [phone]  Fax: [phone]     08/19/2024, 5:06 PM  This note was partially dictated with voice recognition software. Similar sounding words can be transcribed inadequately or may not  be corrected upon review.

## 2024-08-19 NOTE — Patient Instructions (Signed)

## 2024-09-02 DIAGNOSIS — J4550 Severe persistent asthma, uncomplicated: Secondary | ICD-10-CM | POA: Diagnosis not present

## 2024-09-08 ENCOUNTER — Encounter: Payer: Self-pay | Admitting: Allergy & Immunology

## 2024-09-12 ENCOUNTER — Encounter: Payer: Self-pay | Admitting: "Endocrinology

## 2024-09-15 ENCOUNTER — Telehealth: Payer: Self-pay | Admitting: Family Medicine

## 2024-09-15 NOTE — Telephone Encounter (Unsigned)
 Copied from CRM [id number]. Topic: Appointments - Scheduling Inquiry for Clinic >> Sep 15, 2024  4:22 PM Jasmin G wrote: Reason for CRM: [name] mom called to schedule an appt for pt to get a tetanus shot. Call pt back at [phone] to schedule.

## 2024-09-16 NOTE — Telephone Encounter (Signed)
 Left detailed message for patient to call back and schedule appt with triage (visit type: clinical support) to get a tetanus shot. Please schedule patient for this if she calls back.  Does not need to see provider since last physical was 05/13/24.  (Last tetanus shot given 09/14/2014)

## 2024-09-22 ENCOUNTER — Encounter: Payer: Self-pay | Admitting: Nurse Practitioner

## 2024-09-22 ENCOUNTER — Ambulatory Visit: Payer: Self-pay

## 2024-09-22 ENCOUNTER — Ambulatory Visit (INDEPENDENT_AMBULATORY_CARE_PROVIDER_SITE_OTHER): Payer: MEDICAID | Admitting: Nurse Practitioner

## 2024-09-22 VITALS — BP 118/82 | HR 88 | Temp 98.0°F | Ht 59.0 in | Wt 230.0 lb

## 2024-09-22 DIAGNOSIS — J4541 Moderate persistent asthma with (acute) exacerbation: Secondary | ICD-10-CM | POA: Diagnosis not present

## 2024-09-22 MED ORDER — PREDNISONE 20 MG PO TABS: 40.0000 mg | ORAL_TABLET | Freq: Every day | ORAL | 0 refills | Status: AC

## 2024-09-22 NOTE — Telephone Encounter (Signed)
 FYI Only or Action Required?: FYI only for provider.  Patient was last seen in primary care on 07/25/2024 by Dettinger, [name] LABOR, MD.  Called Nurse Triage reporting Shortness of Breath.  Symptoms began yesterday.  Interventions attempted: Other: asthma medications to include the use of nebulizer tx.  Symptoms are: gets better for a while.  Triage Disposition: See HCP Within 4 Hours (Or PCP Triage)  Patient/caregiver understands and will follow disposition?: Yes     Copied from CRM #[id number]. Topic: Clinical - Red Word Triage >> Sep 22, 2024 12:07 PM Tinnie BROCKS wrote: Red Word that prompted transfer to Nurse Triage: Mother harlene (on dpr) is on the line. Pt is having asthma exasperation, hurts to take a deep breath and coughing a lot. Wants her to be seen as soon as possible. Reason for Disposition  [1] MILD difficulty breathing (e.g., minimal/no SOB at rest, SOB with walking, pulse < 100) AND [2] NEW-onset or WORSE than normal  Answer Assessment - Initial Assessment Questions 1. RESPIRATORY STATUS: Describe your breathing? (e.g., wheezing, shortness of breath, unable to speak, severe coughing)      SOB mild only when taking a deep breath, coughing yesterday and none today, no wheezing 2. ONSET: When did this breathing problem begin?      yesterday 3. PATTERN Does the difficult breathing come and go, or has it been constant since it started?      Comes and goes  4. SEVERITY: How bad is your breathing? (e.g., mild, moderate, severe)      Mild to moderate  5. RECURRENT SYMPTOM: Have you had difficulty breathing before? If Yes, ask: When was the last time? and What happened that time?      no 6. CARDIAC HISTORY: Do you have any history of heart disease? (e.g., heart attack, angina, bypass surgery, angioplasty)  HTN, POTS 7. LUNG HISTORY: Do you have any history of lung disease?  (e.g., pulmonary embolus, asthma, emphysema)     asthma 8. CAUSE: What do you think is  causing the breathing problem?      unknown 9. OTHER SYMPTOMS: Do you have any other symptoms? (e.g., chest pain, cough, dizziness, fever, runny nose)     Lightheaded and dizziness but has POTs, bilateral ear pain 7/10 10. O2 SATURATION MONITOR:  Do you use an oxygen saturation monitor (pulse oximeter) at home? If Yes, ask: What is your reading (oxygen level) today? What is your usual oxygen saturation reading? (e.g., 95%) 96 11. PREGNANCY: Is there any chance you are pregnant? When was your last menstrual period?       na 12. TRAVEL: Have you traveled out of the country in the last month? (e.g., travel history, exposures)       na  Left breast all the way down to ribs pain 10/10: pain increases when breathing in   Using breathing treatments with nebulizer and feels better for a while  Protocols used: Breathing Difficulty-A-AH

## 2024-09-22 NOTE — Telephone Encounter (Signed)
 This will be addressed at appt today.

## 2024-09-22 NOTE — Progress Notes (Signed)
 Subjective:    Patient ID: Molly [name], female    DOB: 2001-02-16, 23 y.o.   MRN: [id number]   Chief Complaint: Cough   Cough This is a new problem. The problem has been waxing and waning. The cough is Non-productive. Associated symptoms include chest pain, ear congestion, ear pain and shortness of breath. Pertinent negatives include no sore throat. Nothing aggravates the symptoms. She has tried OTC cough suppressant for the symptoms. The treatment provided mild relief. Her past medical history is significant for asthma.    Patient Active Problem List   Diagnosis Date Noted   Breast pain, left 06/19/2024   Recurrent infections 05/02/2024   Moderate persistent asthma without complication 05/02/2024   Hypothyroidism 10/11/2023   Pelvic pain 09/12/2023   Bilateral low back pain with right-sided sciatica 09/12/2023   Prediabetes 05/10/2023   LLQ pain 03/06/2023   PCO (polycystic ovaries) 03/06/2023   Encounter for menstrual regulation 03/06/2023   ASD (atrial septal defect) 11/22/2022   Morbid obesity (HCC) 10/11/2022   B12 deficiency 10/11/2022   Hyperhidrosis 10/11/2022   Palpitations 10/02/2022   Piriformis syndrome 08/11/2021   OSA (obstructive sleep apnea) 06/10/2020   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   Insomnia 06/10/2020   Sleep paralysis 06/10/2020   Dysmenorrhea 03/23/2020   Menorrhagia with irregular cycle 03/23/2020   Fatigue 03/23/2020   ADHD (attention deficit hyperactivity disorder) 03/09/2020   Unexplained dysphagia 01/21/2020   Anxiety 10/23/2019   Eustachian tube dysfunction, left 10/23/2019   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 04/01/2019   Obesity without serious comorbidity with body mass index (BMI) in 95th to 98th percentile for age in pediatric patient 09/04/2018   PTSD (post-traumatic stress disorder) 03/07/2018   MDD (major depressive disorder), severe (HCC) 03/06/2018   Keratosis pilaris 01/15/2018   Severe persistent asthma,  uncomplicated (HCC) 01/15/2018   Intrinsic atopic dermatitis 01/15/2018   Seasonal and perennial allergic rhinitis 01/15/2018   Anaphylactic shock due to adverse food reaction 01/15/2018   Tic disorder 2019   Scoliosis 2019   Adverse food reaction 07/11/2016   Chronic migraine w/o aura w/o status migrainosus, not intractable 05/07/2012   Hyperlipidemia 2013   History of atrial septal defect repair 12/04/2011   Idiopathic urticaria 09/05/2011   Chronic constipation 09/05/2011   Hypertension 2011       Review of Systems  HENT:  Positive for ear pain. Negative for sore throat.   Respiratory:  Positive for cough and shortness of breath.   Cardiovascular:  Positive for chest pain.       Objective:   Physical Exam Constitutional:      Appearance: Normal appearance. She is obese.  HENT:     Right Ear: Tympanic membrane normal.     Left Ear: Tympanic membrane normal.     Nose: Congestion and rhinorrhea present.     Mouth/Throat:     Pharynx: No oropharyngeal exudate or posterior oropharyngeal erythema.  Cardiovascular:     Rate and Rhythm: Normal rate and regular rhythm.     Heart sounds: Normal heart sounds.  Pulmonary:     Breath sounds: Normal breath sounds.  Skin:    General: Skin is warm.  Neurological:     General: No focal deficit present.     Mental Status: She is alert and oriented to person, place, and time.  Psychiatric:        Mood and Affect: Mood normal.        Behavior: Behavior normal.    BP 118/82  Pulse 88   Temp 98 F (36.7 C) (Temporal)   Ht 4' 11 (1.499 m)   Wt 230 lb (104.3 kg)   SpO2 95%   BMI 46.45 kg/m         Assessment & Plan:   AVNOOR KOURY in today with chief complaint of Cough, ears hurting, and sinus pressure   1. Moderate persistent asthmatic bronchitis with acute exacerbation (Primary) Continue symbicort  BID Nebulizer machine Steroids as ordered RTO prn  Meds ordered this encounter  Medications    predniSONE  (DELTASONE ) 20 MG tablet    Sig: Take 2 tablets (40 mg total) by mouth daily with breakfast for 5 days. 2 po daily for 5 days    Dispense:  10 tablet    Refill:  0    Supervising Provider:   MARYANNE CHEW A [[id number]]        The above assessment and management plan was discussed with the patient. The patient verbalized understanding of and has agreed to the management plan. Patient is aware to call the clinic if symptoms persist or worsen. Patient is aware when to return to the clinic for a follow-up visit. Patient educated on when it is appropriate to go to the emergency department.   Mary-Margaret Gladis, FNP

## 2024-09-22 NOTE — Patient Instructions (Signed)

## 2024-09-23 ENCOUNTER — Other Ambulatory Visit: Payer: Self-pay | Admitting: "Endocrinology

## 2024-09-23 DIAGNOSIS — G4733 Obstructive sleep apnea (adult) (pediatric): Secondary | ICD-10-CM

## 2024-09-23 MED ORDER — ZEPBOUND 2.5 MG/0.5ML ~~LOC~~ SOAJ: 2.5000 mg | SUBCUTANEOUS | 0 refills | Status: DC

## 2024-09-24 NOTE — Progress Notes (Unsigned)
 Electrophysiology Office Note:   Date:  09/26/2024  ID:  CONSUELA [name], DOB 2001-05-07, MRN [id number]  Primary Cardiologist: None Electrophysiologist: [name] Kitty, MD      History of Present Illness:   Molly [name] is a 23 y.o. female with h/o OSA, depression, asthma, migraines, ADHD, PTSD, PCOS, HTN, orthostatic intolerance who is being seen today for follow up.  Patient presents today for scheduled follow-up visit.  She reports feeling better over more recent months, compared to her last visit.  She states that symptoms were worse in the summer months, especially due to the heat when outside.  During the summer she had frequent episodes of lightheadedness and tachycardia with standing, especially when outside.  She had 1 episode of syncope.  More recently, she has not had significant symptoms.  She is diligent about her hydration.  She is not able to exercise frequently.  She reports that she does tolerate recumbent exercise better.  She has not been using compression socks or leggings.  She states that these were difficult to use during the summer months but is hoping to increase use during the winter.  No new or acute complaints today from her usual.   Review of systems complete and found to be negative unless listed in HPI.   EP Information / Studies Reviewed:    EKG is not ordered today. EKG from 06/13/24 reviewed which showed sinus rhythm at 75 bpm, PR 148 ms and QRS 88ms.     Zio 10/2022:   Echo 12/2022:   1. Left ventricular ejection fraction, by estimation, is 55 to 60%. The  left ventricle has normal function. The left ventricle has no regional  wall motion abnormalities. There is mild concentric left ventricular  hypertrophy. Left ventricular diastolic  function could not be evaluated.   2. Right ventricular systolic function is normal. The right ventricular  size is normal. Tricuspid regurgitation signal is inadequate for assessing  PA pressure.   3. S/p  helix repair 2012, device well seated. No interatrial shunt.   4. No evidence of mitral valve regurgitation.   5. The aortic valve was not well visualized. Aortic valve regurgitation  is not visualized.   6. The inferior vena cava is normal in size with greater than 50%  respiratory variability, suggesting right atrial pressure of 3 mmHg.   Physical Exam:   VS:  BP 120/73   Pulse 100   Ht 4' 11 (1.499 m)   Wt 227 lb (103 kg)   SpO2 96%   BMI 45.85 kg/m    Wt Readings from Last 3 Encounters:  09/25/24 227 lb (103 kg)  09/22/24 230 lb (104.3 kg)  08/19/24 228 lb (103.4 kg)     GEN: Well nourished, well developed in no acute distress NECK: No JVD CARDIAC: Normal rate, regular rhythm RESPIRATORY:  Clear to auscultation without rales, wheezing or rhonchi  ABDOMEN: Soft, non-distended EXTREMITIES:  No edema; No deformity   ASSESSMENT AND PLAN:     #Palpitations / Autonomic dysautonomia / Orthostatic intolerance / Tachycardia: Dr. Fernande does not report a formal diagnosis of POTS, although suspected. - Echo 12/2022 normal LVEF, ASD closure functioning appropriately. Likely element of autonomic dysfunction. - Recommend regular exercise, recumbent exercise as likely more tolerable. - Encouraged adequate fluid and salt intake. -Encouraged use of compression stockings and/or leggings.  She had not been using during the summer months.  She is hoping to use more over the wintertime.   # Reported Atach: -Continue low-dose  bisoprolol .  Follow up with EP APP in 3 months.  If symptoms persist despite above lifestyle modifications, especially starting regular exercise and/or use of compression stockings/leggings, which she has not been doing thus far, then we will assist in referring to outside facility for pharmacologic management of suspected POTS.  Signed, [name] Kitty, MD

## 2024-09-25 ENCOUNTER — Ambulatory Visit: Payer: MEDICAID | Attending: Cardiology | Admitting: Cardiology

## 2024-09-25 ENCOUNTER — Encounter: Payer: Self-pay | Admitting: Cardiology

## 2024-09-25 ENCOUNTER — Other Ambulatory Visit: Payer: Self-pay | Admitting: "Endocrinology

## 2024-09-25 ENCOUNTER — Other Ambulatory Visit (HOSPITAL_COMMUNITY): Payer: Self-pay

## 2024-09-25 ENCOUNTER — Telehealth: Payer: Self-pay | Admitting: Pharmacy Technician

## 2024-09-25 VITALS — BP 120/73 | HR 100 | Ht 59.0 in | Wt 227.0 lb

## 2024-09-25 DIAGNOSIS — G90A Postural orthostatic tachycardia syndrome (POTS): Secondary | ICD-10-CM | POA: Insufficient documentation

## 2024-09-25 DIAGNOSIS — I4719 Other supraventricular tachycardia: Secondary | ICD-10-CM | POA: Diagnosis present

## 2024-09-25 NOTE — Patient Instructions (Addendum)
 Medication Instructions:  Your physician recommends that you continue on your current medications as directed. Please refer to the Current Medication list given to you today.  *If you need a refill on your cardiac medications before your next appointment, please call your pharmacy  Follow-Up: At [facility], you and your health needs are our priority.  As part of our continuing mission to provide you with exceptional heart care, our providers are all part of one team.  This team includes your primary Cardiologist (physician) and Advanced Practice Providers or APPs (Physician Assistants and Nurse Practitioners) who all work together to provide you with the care you need, when you need it.  Your next appointment:   3 month  Provider:   You will see one of the following Advanced Practice Providers on your designated Care Team:   Charlies Arthur, [place] Ozell Jodie Passey, PA-C Suzann Riddle, NP Daphne Barrack, NP Artist Pouch, PA-C

## 2024-09-25 NOTE — Telephone Encounter (Signed)
 Pharmacy Patient Advocate Encounter   Received notification from CoverMyMeds that prior authorization for Zepbound 2.5MG /0.5ML pen-injectors is required/requested.   Insurance verification completed.   The patient is insured through [employer].   Per test claim: Effective October 1st, Medicaid will discontinue coverage of GLP1 medications for weight loss (such as Wegovy and Zepbound), unless the patient has a documented history of a heart attack or stroke. Zepbound will continue to be covered only for patients with moderate to severe sleep apnea (AHI 15-30) and a BMI greater than 40. Because of this change, the prior authorization team will not be submitting new PA requests for GLP1 medications prescribed for weight loss, as patients will be unable to continue therapy under Medicaid coverage.   **I see the patient was diagnosed with OSA, but I am not able to find a sleep study on her chart. In order to avoid a denial from Medicaid, the sleep study results is needed to submit with the prior authorization**

## 2024-09-26 NOTE — Telephone Encounter (Signed)
 Sent pt a message via MyChart making her aware of the need for her sleep study results.

## 2024-09-28 ENCOUNTER — Encounter: Payer: Self-pay | Admitting: Family Medicine

## 2024-09-29 ENCOUNTER — Ambulatory Visit: Payer: Self-pay

## 2024-09-29 NOTE — Telephone Encounter (Signed)
 FYI Only or Action Required?: FYI only for provider.  Patient was last seen in primary care on 09/22/2024 by Gladis Mustard, FNP.  Called Nurse Triage reporting Recurrent Skin Infections.  Symptoms began yesterday.  Interventions attempted: Nothing.  Symptoms are: stable.  Triage Disposition: Home Care  Patient/caregiver understands and will follow disposition?: No  Copied from CRM [id number]. Topic: Clinical - Red Word Triage >> Sep 29, 2024 10:23 AM Farrel B wrote: [facility] that prompted transfer to Nurse Triage: mother Analie Katzman is on the line stating daughter has abscess under arm, body aches, flu like symptoms Reason for Disposition  Red tender lump < 1/2 inch across (< 12 mm; smaller than a marble)  Answer Assessment - Initial Assessment Questions 1. APPEARANCE of BOIL: What does the boil look like?      A hole right now,  2. LOCATION: Where is the boil located?      armpit 3. NUMBER: How many boils are there?      1 4. SIZE: How big is the boil? (e.g., inches, cm; compare to size of a coin or other object)     Smaller than a penny 5. ONSET: When did the boil start?     yesterday 6. PAIN: Is there any pain? If Yes, ask: How bad is the pain?   (Scale 1-10; or mild, moderate, severe)     mild 7. FEVER: Do you have a fever? If Yes, ask: What is it, how was it measured, and when did it start?      denies 8. SOURCE: Have you been around anyone with boils or other Staph infections? Have you ever had boils before?     Hx of boils 9. OTHER SYMPTOMS: Do you have any other symptoms? (e.g., shaking chills, weakness, rash elsewhere on body)     States shaking and chills, states she has not taken a temp today,  10. PREGNANCY: Is there any chance you are pregnant? When was your last menstrual period?       Denies  Pt has not checked with at home test for covid nor flu. Pt states that she is having a cough and feels tired, and  achy.  Protocols used: Boil (Skin Abscess)-A-AH

## 2024-09-29 NOTE — Telephone Encounter (Signed)
 Appt made.

## 2024-09-30 ENCOUNTER — Ambulatory Visit (INDEPENDENT_AMBULATORY_CARE_PROVIDER_SITE_OTHER): Payer: MEDICAID

## 2024-09-30 ENCOUNTER — Ambulatory Visit: Payer: Self-pay | Admitting: Family Medicine

## 2024-09-30 ENCOUNTER — Encounter: Payer: Self-pay | Admitting: Family Medicine

## 2024-09-30 ENCOUNTER — Ambulatory Visit: Payer: MEDICAID | Admitting: Family Medicine

## 2024-09-30 ENCOUNTER — Telehealth: Payer: Self-pay

## 2024-09-30 ENCOUNTER — Ambulatory Visit (INDEPENDENT_AMBULATORY_CARE_PROVIDER_SITE_OTHER): Payer: MEDICAID | Admitting: Family Medicine

## 2024-09-30 VITALS — BP 113/76 | HR 58 | Temp 96.8°F | Ht 59.0 in | Wt 232.6 lb

## 2024-09-30 DIAGNOSIS — L02412 Cutaneous abscess of left axilla: Secondary | ICD-10-CM | POA: Diagnosis not present

## 2024-09-30 DIAGNOSIS — W19XXXA Unspecified fall, initial encounter: Secondary | ICD-10-CM | POA: Diagnosis not present

## 2024-09-30 DIAGNOSIS — R6889 Other general symptoms and signs: Secondary | ICD-10-CM

## 2024-09-30 DIAGNOSIS — M79672 Pain in left foot: Secondary | ICD-10-CM

## 2024-09-30 DIAGNOSIS — G90A Postural orthostatic tachycardia syndrome (POTS): Secondary | ICD-10-CM

## 2024-09-30 DIAGNOSIS — R52 Pain, unspecified: Secondary | ICD-10-CM

## 2024-09-30 MED ORDER — SULFAMETHOXAZOLE-TRIMETHOPRIM 800-160 MG PO TABS: 1.0000 | ORAL_TABLET | Freq: Two times a day (BID) | ORAL | 0 refills | Status: DC

## 2024-09-30 NOTE — Progress Notes (Signed)
 Subjective:  Patient ID: Molly [name], female    DOB: 12-30-2000, 23 y.o.   MRN: [id number]  Patient Care Team: [name], FNP as PCP - General (Family Medicine) Kennyth Chew, MD as PCP - Electrophysiology (Cardiology) Iva Marty Saltness, MD as Consulting Physician (Allergy  and Immunology) Okey Barnie SAUNDERS, MD as Consulting Physician [facility]) Isidoro Heron Harlem, MD as Consulting Physician (Neurology) Cheron, Annabella Houston, MD as Consulting Physician (Pediatric Gastroenterology)   Chief Complaint:  abcess under left arm (X 2 days), Generalized Body Aches, Fatigue, Cough (X 3 days ), and Foot Pain ([place] )   HPI: Molly [name] is a 23 y.o. female presenting on 09/30/2024 for abcess under left arm (X 2 days), Generalized Body Aches, Fatigue, Cough (X 3 days ), and Foot Pain ([place] x [place] )   Molly [name] is a 23 year old female who presents with flu-like symptoms and an abscess on her left arm.  She developed an abscess on her left arm two days ago, initially experiencing pain similar to a cut. The abscess formed a hole and began to drain. Her mother observed the drainage and suggested she see a doctor.  In addition to the abscess, she has flu-like symptoms including body aches, cold chills, sweating, increased fatigue, and a mild cough. The cough is less severe than during previous asthma exacerbations. She is using Tylenol and her inhalers to manage these symptoms. She recently completed a course of steroids and cough medicine for her last asthma exacerbation.  She reports persistent pain in her left foot following a fall on October 4th, with pain extending from the top of her foot to her toes. She is managing the pain with massage, ibuprofen, Tylenol, and occasional ice application. She has not wrapped the foot but has been elevating it and did not  seek initial evaluation after the fall.  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 She is interested in boosting her immune system with zinc, vitamin C , and elderberry supplements.          Relevant past medical, surgical, family, and social history reviewed and updated as indicated.  Allergies and medications reviewed and updated. Data reviewed: Chart in Epic.   Past Medical History:  Diagnosis Date   Acid reflux 2010   Acne vulgaris 2017   ADHD (attention deficit hyperactivity disorder) 2011   Adverse food reaction 07/11/2016   Shellfish allergy    Amenorrhea 2019   Anaphylactic shock due to adverse food reaction 01/15/2018   Anxiety 2017   ASD (atrial septal defect) 11/22/2022   Chronic constipation 09/05/2011   Chronic tension headaches 2011   originally followed by [facility]. Then Dr Tonette [facility] 03/2020 (see above)    Coalition, calcaneus navicular    Dysmenorrhea    Eczema    Eustachian tube dysfunction, left 10/23/2019   Frequent headaches    Galactorrhea    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 04/01/2019   Hiatal hernia    History of atrial septal defect repair 12/04/2011   Hyperlipidemia 2013   Hypertension 2011   Hypertension 09/28/2020   hypertension, cellulitus    Idiopathic urticaria 09/05/2011   Insomnia    Keratosis pilaris 01/15/2018   Mass of left thigh 11/22/2017   Overview:  Added automatically from request for surgery [id number]   MDD (major depressive disorder),  severe (HCC) 03/06/2018   Migraine 2011   re-diagnosed as chronic migraines 03/2020 [facility] - starting on preventative    Obesity without serious comorbidity with body mass index (BMI) in 95th to 98th percentile for age in pediatric patient 09/04/2018   PCOS (polycystic ovarian syndrome)    Piriformis syndrome    POTS (postural orthostatic tachycardia syndrome)    PTSD (post-traumatic stress disorder)    Scoliosis 2019   Seasonal and  perennial allergic rhinitis 2010   Severe persistent asthma without complication (HCC) 2010   Sleep apnea    Sleep paralysis    Suicidal ideation 03/07/2018   Thyroid  disease    underactive thyroid    Tic disorder 2019    Past Surgical History:  Procedure Laterality Date   ASD REPAIR  2012   with Helix, via Cardiac Catheterization   atrial septal defecgt     CARDIAC SURGERY     DENTAL SURGERY  2020   lipoma removal  2019   LUMBAR PUNCTURE  05/20/2021   TONSILLECTOMY AND ADENOIDECTOMY  2010    Social History   Socioeconomic History   Marital status: Significant Other    Spouse name: Not on file   Number of children: Not on file   Years of education: Not on file   Highest education level: Not on file  Occupational History   Not on file  Tobacco Use   Smoking status: Never   Smokeless tobacco: Never  Vaping Use   Vaping status: Never Used  Substance and Sexual Activity   Alcohol  use: No    Alcohol /week: 0.0 standard drinks of alcohol    Drug use: No   Sexual activity: Never    Birth control/protection: Pill  Other Topics Concern   Not on file  Social History Narrative   Not on file   Social Drivers of Health   Financial Resource Strain: Low Risk  (07/04/2024)   Received from [facility]   Overall Financial Resource Strain (CARDIA)    How hard is it for you to pay for the very basics like food, housing, medical care, and heating?: Not hard at all  Food Insecurity: No Food Insecurity (07/04/2024)   Received from [facility]   Hunger Vital Sign    Within the past 12 months, you worried that your food would run out before you got the money to buy more.: Never true    Within the past 12 months, the food you bought just didn't last and you didn't have money to get more.: Never true  Transportation Needs: No Transportation Needs (07/04/2024)   Received from [facility] - Transportation    Lack of Transportation (Medical): No    Lack of Transportation  (Non-Medical): No  Physical Activity: Sufficiently Active (07/04/2024)   Received from [facility]   Exercise Vital Sign    On average, how many days per week do you engage in moderate to strenuous exercise (like a brisk walk)?: 4 days    On average, how many minutes do you engage in exercise at this level?: 150+ min  Stress: No Stress Concern Present (07/04/2024)   Received from [facility] - Occupational Stress Questionnaire    Do you feel stress - tense, restless, nervous, or anxious, or unable to sleep at night because your mind is troubled all the time - these days?: Not at all  Social Connections: Socially Isolated (07/04/2024)   Received  from [facility]   Social Connection and Isolation Panel    In a typical week, how many times do you talk on the phone with family, friends, or neighbors?: Twice a week    How often do you get together with friends or relatives?: Once a week    How often do you attend church or religious services?: Never    Do you belong to any clubs or organizations such as church groups, unions, fraternal or athletic groups, or school groups?: No    How often do you attend meetings of the clubs or organizations you belong to?: Never    Are you married, widowed, divorced, separated, never married, or living with a partner?: Never married  Intimate Partner Violence: Not At Risk (07/04/2024)   Received from [facility]   Humiliation, Afraid, Rape, and Kick questionnaire    Within the last year, have you been afraid of your partner or ex-partner?: No    Within the last year, have you been humiliated or emotionally abused in other ways by your partner or ex-partner?: No    Within the last year, have you been kicked, hit, slapped, or otherwise physically hurt by your partner or ex-partner?: No    Within the last year, have you been raped or forced to have any kind of sexual activity by your partner or ex-partner?: No     Outpatient Encounter Medications as of 09/30/2024  Medication Sig   AJOVY 225 MG/1.5ML SOAJ Inject 225 mg into the skin.   albuterol  (PROVENTIL ) (2.5 MG/3ML) 0.083% nebulizer solution Take 3 mLs (2.5 mg total) by nebulization every 6 (six) hours as needed for wheezing or shortness of breath.   ascorbic acid  (VITAMIN C ) 500 MG tablet Take 500 mg by mouth daily.   atomoxetine  (STRATTERA ) 60 MG capsule Take 1 capsule (60 mg total) by mouth daily.   Azelastine -Fluticasone  (DYMISTA ) 137-50 MCG/ACT SUSP PLACE 1 SPRAY INTO BOTH NOSTRILS 2 (TWO) TIMES DAILY   baclofen (LIORESAL) 10 MG tablet Take 10 mg by mouth 3 (three) times daily.   bisoprolol  (ZEBETA ) 5 MG tablet Take 1 tablet (5 mg total) by mouth daily.   budesonide -formoterol  (SYMBICORT ) 160-4.5 MCG/ACT inhaler Inhale 1 puff into the lungs daily.   busPIRone  (BUSPAR ) 10 MG tablet Take 1 tablet (10 mg total) by mouth 2 (two) times daily.   calcium  carbonate (OS-CAL) 1250 (500 Ca) MG chewable tablet Chew by mouth.   cariprazine  (VRAYLAR ) 1.5 MG capsule Take 1 capsule (1.5 mg total) by mouth daily.   Cholecalciferol 25 MCG (1000 UT) tablet Take 1 Units by mouth daily at 2 PM.   clobetasol  (TEMOVATE ) 0.05 % external solution Apply topically 2 (two) times daily as needed.   clonazePAM  (KLONOPIN ) 0.5 MG tablet Take 1 tablet (0.5 mg total) by mouth 2 (two) times daily as needed for anxiety.   dicyclomine (BENTYL) 10 MG capsule Take 10 mg by mouth as needed for spasms.   EPINEPHRINE  0.3 mg/0.3 mL IJ SOAJ injection INJECT 0.3 MG INTO THE MUSCLE AS NEEDED FOR ANAPHYLAXIS.   fexofenadine  (ALLEGRA ) 180 MG tablet TAKE 1 TABLET BY MOUTH EVERY DAY   hydrOXYzine  (VISTARIL ) 25 MG capsule Take 1-2 capsules (25-50 mg total) by mouth daily as needed.   levothyroxine  (SYNTHROID ) 25 MCG tablet Take 1 tablet (25 mcg total) by mouth daily before breakfast.   LINZESS 72 MCG capsule Take 72 mcg by mouth daily.   Loratadine  (CLARITIN  PO) Take by mouth.    montelukast  (SINGULAIR ) 10  MG tablet TAKE 1 TABLET BY MOUTH EVERY DAY   Multiple Vitamin (MULTIVITAMIN) capsule Take by mouth.   naproxen  (NAPROSYN ) 500 MG tablet Take 1 tablet (500 mg total) by mouth 2 (two) times daily with a meal. For painful periods (Patient taking differently: Take 500 mg by mouth daily as needed for mild pain (pain score 1-3) or moderate pain (pain score 4-6). For painful periods)   norethindrone  (MICRONOR ) 0.35 MG tablet TAKE 1 TABLET BY MOUTH EVERY DAY   Olopatadine  HCl 0.2 % SOLN Apply 1 drop to eye every morning.   ondansetron (ZOFRAN-ODT) 4 MG disintegrating tablet DISSOLVE ON TONGUE AT ONSET OF NAUSEA- 30 DAY SUPPLY   pantoprazole  (PROTONIX ) 40 MG tablet Take 1 tablet by mouth in the morning and at bedtime.   Polyethylene Glycol 3350  (MIRALAX  PO) Take by mouth as needed.   RESTASIS 0.05 % ophthalmic emulsion 1 drop 2 (two) times daily.   RIZATRIPTAN BENZOATE PO Take by mouth. (Patient taking differently: Take by mouth as needed (mirgraines).)   sertraline  (ZOLOFT ) 50 MG tablet Take 1.5 tablets (75 mg total) by mouth daily.   sulfamethoxazole -trimethoprim  (BACTRIM  DS) 800-160 MG tablet Take 1 tablet by mouth 2 (two) times daily for 7 days.   tirzepatide (ZEPBOUND) 2.5 MG/0.5ML Pen Inject 2.5 mg into the skin once a week.   topiramate (TOPAMAX) 50 MG tablet Take 100 mg by mouth 2 (two) times daily.   VENTOLIN  HFA 108 (90 Base) MCG/ACT inhaler INHALE 2 PUFFS BY MOUTH EVERY 4 HOURS AS NEEDED FOR WHEEZE OR FOR SHORTNESS OF BREATH   No facility-administered encounter medications on file as of 09/30/2024.    Allergies  Allergen Reactions   Brompheniramine Swelling and Anxiety   Glucosamine Forte [Nutritional Supplements] Anaphylaxis    Due to containing shellfish   Nutritional Supplements Anaphylaxis   Oyster Extract Anaphylaxis   Shellfish Protein-Containing Drug Products Anaphylaxis    Throat swelling   Cats Claw (Uncaria Tomentosa) Nausea And Vomiting   Diamox  [Acetazolamide] Other (See Comments)    Numbness- feet, hands, lips   Lactose    Lactose Intolerance (Gi)    Topiramate Other (See Comments)    Numbness to face, hands and feet   Bromfed Dm [Pseudoeph-Bromphen-Dm] Anxiety    CARBOFED DM    Pertinent ROS per HPI, otherwise unremarkable      Objective:  BP 113/76   Pulse (!) 58   Temp (!) 96.8 F (36 C)   Ht 4' 11 (1.499 m)   Wt 232 lb 9.6 oz (105.5 kg)   SpO2 100%   BMI 46.98 kg/m    Wt Readings from Last 3 Encounters:  09/30/24 232 lb 9.6 oz (105.5 kg)  09/25/24 227 lb (103 kg)  09/22/24 230 lb (104.3 kg)    Physical Exam Vitals and nursing note reviewed.  Constitutional:      General: She is not in acute distress.    Appearance: Normal appearance. She is well-developed and well-groomed. She is morbidly obese. She is not ill-appearing, toxic-appearing or diaphoretic.  HENT:     Head: Normocephalic and atraumatic.     Jaw: There is normal jaw occlusion.     Right Ear: Hearing, tympanic membrane, ear canal and external ear normal.     Left Ear: Hearing, tympanic membrane, ear canal and external ear normal.     Nose: Nose normal.     Mouth/Throat:     Lips: Pink.     Mouth: Mucous membranes are moist.  Pharynx: Oropharynx is clear. Uvula midline. Postnasal drip present. No posterior oropharyngeal erythema.  Eyes:     General: Lids are normal.     Extraocular Movements: Extraocular movements intact.     Conjunctiva/sclera: Conjunctivae normal.     Pupils: Pupils are equal, round, and reactive to light.  Neck:     Thyroid : No thyroid  mass, thyromegaly or thyroid  tenderness.     Vascular: No carotid bruit or JVD.     Trachea: Trachea and phonation normal.  Cardiovascular:     Rate and Rhythm: Normal rate and regular rhythm.     Chest Wall: PMI is not displaced.     Pulses: Normal pulses.          Dorsalis pedis pulses are 2+ on the left side.       Posterior tibial pulses are 2+ on the left side.     Heart  sounds: Normal heart sounds. No murmur heard.    No friction rub. No gallop.  Pulmonary:     Effort: Pulmonary effort is normal. No respiratory distress.     Breath sounds: Normal breath sounds. No wheezing.  Abdominal:     General: Bowel sounds are normal. There is no distension or abdominal bruit.     Palpations: Abdomen is soft. There is no hepatomegaly or splenomegaly.     Tenderness: There is no abdominal tenderness. There is no right CVA tenderness or left CVA tenderness.     Hernia: No hernia is present.  Musculoskeletal:        General: Normal range of motion.     Cervical back: Normal range of motion and neck supple.     Right lower leg: No edema.     Left lower leg: No edema.     Left foot: Normal range of motion. No deformity, bunion, Charcot foot, foot drop or prominent metatarsal heads.       Feet:  Feet:     Left foot:     Skin integrity: Skin integrity normal.     Toenail Condition: Left toenails are normal.  Lymphadenopathy:     Cervical: No cervical adenopathy.  Skin:    General: Skin is warm and dry.     Capillary Refill: Capillary refill takes less than 2 seconds.     Coloration: Skin is not cyanotic, jaundiced or pale.     Findings: Abscess present. No rash.      Neurological:     General: No focal deficit present.     Mental Status: She is alert and oriented to person, place, and time.     Sensory: Sensation is intact.     Motor: Motor function is intact.     Coordination: Coordination is intact.     Gait: Gait is intact. Gait normal.     Deep Tendon Reflexes: Reflexes are normal and symmetric.  Psychiatric:        Attention and Perception: Attention and perception normal.        Mood and Affect: Mood and affect normal.        Speech: Speech normal.        Behavior: Behavior normal. Behavior is cooperative.        Thought Content: Thought content normal.        Cognition and Memory: Cognition and memory normal.        Judgment: Judgment normal.        Results for orders placed or performed in visit on 08/19/24  HgB A1c  Collection Time: 08/19/24  4:07 PM  Result Value Ref Range   Hemoglobin A1C     HbA1c POC (<> result, manual entry)     HbA1c, POC (prediabetic range) 5.1 (A) 5.7 - 6.4 %   HbA1c, POC (controlled diabetic range)      X-Ray: left foot: No acute findings. Preliminary x-ray reading by Rosaline Bruns, FNP-C, [facility].   Pertinent labs & imaging results that were available during my care of the patient were reviewed by me and considered in my medical decision making.  Assessment & Plan:  Lynasia Meloche was seen today for abcess under left arm, generalized body aches, fatigue, cough and foot pain.  Diagnoses and all orders for this visit:  Generalized body aches -     COVID-19, Flu A+B and RSV  Flu-like symptoms -     COVID-19, Flu A+B and RSV  Abscess of left axilla -     sulfamethoxazole -trimethoprim  (BACTRIM  DS) 800-160 MG tablet; Take 1 tablet by mouth 2 (two) times daily for 7 days.  Left foot pain -     DG Foot Complete Left  Fall, initial encounter -     DG Foot Complete Left  POTS (postural orthostatic tachycardia syndrome) -     COVID-19, Flu A+B and RSV        Left foot pain Left foot pain persisting since fall on October 4th, localized from the top of the foot to the toes. Differential includes sprain or fracture. - Order x-ray of left foot to rule out fracture - Apply ACE wrap for support after x-ray  Left arm cutaneous abscess Left arm cutaneous abscess present for two days, draining spontaneously. - Prescribe antibiotics for abscess  Acute upper respiratory viral illness Symptoms include myalgia, chills, fatigue, mild cough, headaches, and dizziness. Managed with acetaminophen and inhalers. Recent asthma exacerbation treated with steroids and cough medicine. Viral tests for influenza, RSV, and COVID pending, but past the point of antiviral treatment. - Continue symptomatic  management with acetaminophen and inhalers - Await results of influenza, RSV, and COVID tests  Asthma Recent exacerbation treated with steroids and cough medicine. Current symptoms include mild cough, but no wheezing on examination. 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 - Advise intake of vitamin C , zinc, elderberry, and a multivitamin        I spent 40 minutes dedicated to the care of this patient on the date of this encounter to include pre-visit review of records including diagnostic studies and labs, face-to-face time with the patient discussing above conditions, post visit ordering of testing, clinical documentation in the electronic health record, making appropriate referrals as documented, and communicating necessary information to the patient's healthcare team.    Continue all other maintenance medications.  Follow up plan: Return if symptoms worsen or fail to improve.   Continue healthy lifestyle choices, including diet (rich in fruits, vegetables, and lean proteins, and low in salt and simple carbohydrates) and exercise (at least 30 minutes of moderate physical activity daily).  Educational handout given for abscess  The above assessment and management plan was discussed with the patient. The patient verbalized understanding of and has agreed to the management plan. Patient is aware to call the clinic if they develop any new symptoms or if symptoms persist or worsen. Patient is aware when to return to the clinic for a follow-up visit. Patient educated on when it is appropriate  to go to the emergency department.   Rosaline Bruns, FNP-C [facility] [phone]

## 2024-09-30 NOTE — Telephone Encounter (Signed)
 Pharmacy Patient Advocate Encounter   Received notification from Pt Calls Messages that prior authorization for Zepbound 2.5MG /0.5ML pen-injectors is required/requested.   Insurance verification completed.   The patient is insured through [employer].   Per test claim: PA required; PA started via CoverMyMeds. KEY [id number] . Please see clinical question(s) below that I am not finding the answer to in their chart and advise.     The patient has a diagnosis of OSA and morbid obesity (BMI >40), but the most recent sleep study (05/16/2024) shows only mild OSA (pAHI = 8.9), which does NOT meet the threshold for moderate to severe OSA (AHI >=15)

## 2024-09-30 NOTE — Telephone Encounter (Signed)
Printed and in your box.

## 2024-09-30 NOTE — Telephone Encounter (Signed)
 Received a copy of pt's sleep study. It has been scanned in to her chart under Media. Thank you for your help.

## 2024-10-01 ENCOUNTER — Encounter: Payer: Self-pay | Admitting: "Endocrinology

## 2024-10-01 ENCOUNTER — Encounter: Payer: Self-pay | Admitting: Family Medicine

## 2024-10-01 DIAGNOSIS — L02412 Cutaneous abscess of left axilla: Secondary | ICD-10-CM

## 2024-10-01 LAB — COVID-19, FLU A+B AND RSV
Influenza A, NAA: NOT DETECTED
Influenza B, NAA: NOT DETECTED
RSV, NAA: NOT DETECTED
SARS-CoV-2, NAA: NOT DETECTED

## 2024-10-02 MED ORDER — DOXYCYCLINE HYCLATE 100 MG PO TABS: 100.0000 mg | ORAL_TABLET | Freq: Two times a day (BID) | ORAL | 0 refills | Status: AC

## 2024-10-03 ENCOUNTER — Ambulatory Visit: Payer: MEDICAID

## 2024-10-03 DIAGNOSIS — J4550 Severe persistent asthma, uncomplicated: Secondary | ICD-10-CM | POA: Diagnosis not present

## 2024-10-07 NOTE — Telephone Encounter (Signed)
Pt made aware via MyChart message 

## 2024-10-08 ENCOUNTER — Encounter: Payer: Self-pay | Admitting: Allergy & Immunology

## 2024-11-02 DIAGNOSIS — J4550 Severe persistent asthma, uncomplicated: Secondary | ICD-10-CM | POA: Diagnosis not present

## 2024-11-04 ENCOUNTER — Telehealth (INDEPENDENT_AMBULATORY_CARE_PROVIDER_SITE_OTHER): Payer: MEDICAID | Admitting: Psychiatry

## 2024-11-04 ENCOUNTER — Encounter (HOSPITAL_COMMUNITY): Payer: Self-pay | Admitting: Psychiatry

## 2024-11-04 DIAGNOSIS — F902 Attention-deficit hyperactivity disorder, combined type: Secondary | ICD-10-CM | POA: Diagnosis not present

## 2024-11-04 DIAGNOSIS — F333 Major depressive disorder, recurrent, severe with psychotic symptoms: Secondary | ICD-10-CM

## 2024-11-04 MED ORDER — BUSPIRONE HCL 10 MG PO TABS: 10.0000 mg | ORAL_TABLET | Freq: Two times a day (BID) | ORAL | 2 refills | Status: AC

## 2024-11-04 MED ORDER — SERTRALINE HCL 50 MG PO TABS: 75.0000 mg | ORAL_TABLET | Freq: Every day | ORAL | 1 refills | Status: AC

## 2024-11-04 MED ORDER — ATOMOXETINE HCL 60 MG PO CAPS: 60.0000 mg | ORAL_CAPSULE | Freq: Every day | ORAL | 1 refills | Status: AC

## 2024-11-04 MED ORDER — HYDROXYZINE PAMOATE 25 MG PO CAPS: 25.0000 mg | ORAL_CAPSULE | Freq: Every day | ORAL | 1 refills | Status: AC | PRN

## 2024-11-04 MED ORDER — CARIPRAZINE HCL 1.5 MG PO CAPS: 1.5000 mg | ORAL_CAPSULE | Freq: Every day | ORAL | 2 refills | Status: AC

## 2024-11-04 NOTE — Progress Notes (Signed)
 Virtual Visit via Video Note  I connected with Molly [name] on 11/04/24 at 11:20 AM EST by a video enabled telemedicine application and verified that I am speaking with the correct person using two identifiers.  Location: Patient: home Provider: office   I discussed the limitations of evaluation and management by telemedicine and the availability of in person appointments. The patient expressed understanding and agreed to proceed.      I discussed the assessment and treatment plan with the patient. The patient was provided an opportunity to ask questions and all were answered. The patient agreed with the plan and demonstrated an understanding of the instructions.   The patient was advised to call back or seek an in-person evaluation if the symptoms worsen or if the condition fails to improve as anticipated.  I provided 20 minutes of non-face-to-face time during this encounter.   Barnie Gull, MD  [facility] MD/PA/NP OP Progress Note  11/04/2024 11:40 AM Molly [name]  MRN:  [id number]  Chief Complaint:  Chief Complaint  Patient presents with   Anxiety   Depression   Follow-up   HPI: This patient is a 23 year old female lives with her mother mother's fianc and a younger brother in [place]. 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.   The patient returns for follow-up after 3 months regarding her major depression, generalized anxiety disorder and ADD.  For the most part she is doing okay.  Her POTS disorder is not as bad during the winter.  She is trying to walk around and get a little bit more exercise and to stay hydrated.  Overall her mood has been good and she denies significant depression thoughts of self-harm or suicide.  She is sleeping well.  She denies significant anxiety or mood swings. Visit Diagnosis:    ICD-10-CM   1. Severe episode of recurrent major depressive disorder, with psychotic features  (HCC)  F33.3     2. Attention deficit hyperactivity disorder (ADHD), combined type  F90.2       Past Psychiatric History: Past psychiatric admissions at a younger age and 2 years of [facility]   Past Medical History:  Past Medical History:  Diagnosis Date   Acid reflux 2010   Acne vulgaris 2017   ADHD (attention deficit hyperactivity disorder) 2011   Adverse food reaction 07/11/2016   Shellfish allergy    Amenorrhea 2019   Anaphylactic shock due to adverse food reaction 01/15/2018   Anxiety 2017   ASD (atrial septal defect) 11/22/2022   Chronic constipation 09/05/2011   Chronic tension headaches 2011   originally followed by [facility] K.Griffin. Then Dr Tonette [facility] 03/2020 (see above)    Coalition, calcaneus navicular    Dysmenorrhea    Eczema    Eustachian tube dysfunction, left 10/23/2019   Frequent headaches    Galactorrhea    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 04/01/2019   Hiatal hernia    History of atrial septal defect repair 12/04/2011   Hyperlipidemia 2013   Hypertension 2011   Hypertension 09/28/2020   hypertension, cellulitus    Idiopathic urticaria 09/05/2011   Insomnia    Keratosis pilaris 01/15/2018   Mass of left thigh 11/22/2017   Overview:  Added automatically from request for surgery [id number]   MDD (major depressive disorder), severe (HCC) 03/06/2018   Migraine 2011   re-diagnosed as chronic migraines 03/2020 [facility] - starting on preventative  Obesity without serious comorbidity with body mass index (BMI) in 95th to 98th percentile for age in pediatric patient 09/04/2018   PCOS (polycystic ovarian syndrome)    Piriformis syndrome    POTS (postural orthostatic tachycardia syndrome)    PTSD (post-traumatic stress disorder)    Scoliosis 2019   Seasonal and perennial allergic rhinitis 2010   Severe persistent asthma without complication (HCC) 2010   Sleep apnea    Sleep paralysis    Suicidal ideation 03/07/2018   Thyroid  disease     underactive thyroid    Tic disorder 2019    Past Surgical History:  Procedure Laterality Date   ASD REPAIR  2012   with Helix, via Cardiac Catheterization   atrial septal defecgt     CARDIAC SURGERY     DENTAL SURGERY  2020   lipoma removal  2019   LUMBAR PUNCTURE  05/20/2021   TONSILLECTOMY AND ADENOIDECTOMY  2010    Family Psychiatric History: See below  Family History:  Family History  Problem Relation Age of Onset   Hypertension Mother    Hyperlipidemia Mother    Diabetes Mother    Depression Mother    Arthritis Mother    Allergic rhinitis Mother    Asthma Mother    Bipolar disorder Mother    Anxiety disorder Mother    Mental illness Mother    Miscarriages / Stillbirths Mother    Thyroid  disease Mother    Bipolar disorder Father    Drug abuse Father    Alcohol  abuse Father    ADD / ADHD Father    Mental illness Father    Allergic rhinitis Brother    Asthma Brother    ADD / ADHD Brother    Learning disabilities Brother    Hypertension Maternal Grandmother    Hyperlipidemia Maternal Grandmother    Heart disease Maternal Grandmother    Heart attack Maternal Grandfather    Bipolar disorder Maternal Grandfather    Angioedema Neg Hx    Eczema Neg Hx    Immunodeficiency Neg Hx    Urticaria Neg Hx     Social History:  Social History   Socioeconomic History   Marital status: Significant Other    Spouse name: Not on file   Number of children: Not on file   Years of education: Not on file   Highest education level: Not on file  Occupational History   Not on file  Tobacco Use   Smoking status: Never   Smokeless tobacco: Never  Vaping Use   Vaping status: Never Used  Substance and Sexual Activity   Alcohol  use: No    Alcohol /week: 0.0 standard drinks of alcohol    Drug use: No   Sexual activity: Never    Birth control/protection: Pill  Other Topics Concern   Not on file  Social History Narrative   Not on file   Social Drivers of Health   Financial  Resource Strain: Low Risk (07/04/2024)   Received from [facility]   Overall Financial Resource Strain (CARDIA)    How hard is it for you to pay for the very basics like food, housing, medical care, and heating?: Not hard at all  Food Insecurity: No Food Insecurity (07/04/2024)   Received from [facility]   Hunger Vital Sign    Within the past 12 months, you worried that your food would run out before you got the money to buy more.: Never true    Within the past 12 months, the  food you bought just didn't last and you didn't have money to get more.: Never true  Transportation Needs: No Transportation Needs (07/04/2024)   Received from [facility] - Transportation    Lack of Transportation (Medical): No    Lack of Transportation (Non-Medical): No  Physical Activity: Sufficiently Active (07/04/2024)   Received from [facility]   Exercise Vital Sign    On average, how many days per week do you engage in moderate to strenuous exercise (like a brisk walk)?: 4 days    On average, how many minutes do you engage in exercise at this level?: 150+ min  Stress: No Stress Concern Present (07/04/2024)   Received from [facility] - Occupational Stress Questionnaire    Do you feel stress - tense, restless, nervous, or anxious, or unable to sleep at night because your mind is troubled all the time - these days?: Not at all  Social Connections: Socially Isolated (07/04/2024)   Received from [facility]   Social Connection and Isolation Panel    In a typical week, how many times do you talk on the phone with family, friends, or neighbors?: Twice a week    How often do you get together with friends or relatives?: Once a week    How often do you attend church or religious services?: Never    Do you belong to any clubs or organizations such as church groups, unions, fraternal or athletic groups, or school groups?: No    How often do you attend  meetings of the clubs or organizations you belong to?: Never    Are you married, widowed, divorced, separated, never married, or living with a partner?: Never married    Allergies:  Allergies  Allergen Reactions   Brompheniramine Swelling and Anxiety   Glucosamine Forte [Nutritional Supplements] Anaphylaxis    Due to containing shellfish   Nutritional Supplements Anaphylaxis   Oyster Extract Anaphylaxis   Shellfish Protein-Containing Drug Products Anaphylaxis    Throat swelling   Cats Claw (Uncaria Tomentosa) Nausea And Vomiting   Diamox [Acetazolamide] Other (See Comments)    Numbness- feet, hands, lips   Lactose    Lactose Intolerance (Gi)    Topiramate Other (See Comments)    Numbness to face, hands and feet   Bromfed Dm [Pseudoeph-Bromphen-Dm] Anxiety    CARBOFED DM    Metabolic Disorder Labs: Lab Results  Component Value Date   HGBA1C 5.1 (A) 08/19/2024   MPG 105.41 03/07/2018   Lab Results  Component Value Date   PROLACTIN 49.7 (H) 03/07/2018   Lab Results  Component Value Date   CHOL 198 08/15/2024   TRIG 148 08/15/2024   HDL 43 08/15/2024   CHOLHDL 4.6 (H) 08/15/2024   VLDL 20 03/07/2018   LDLCALC 128 (H) 08/15/2024   LDLCALC 133 (H) 05/13/2024   Lab Results  Component Value Date   TSH <0.005 (L) 08/15/2024   TSH 1.340 05/13/2024    Therapeutic Level Labs: No results found for: LITHIUM No results found for: VALPROATE No results found for: CBMZ  Current Medications: Current Outpatient Medications  Medication Sig Dispense Refill   AJOVY 225 MG/1.5ML SOAJ Inject 225 mg into the skin.     albuterol  (PROVENTIL ) (2.5 MG/3ML) 0.083% nebulizer solution Take 3 mLs (2.5 mg total) by nebulization every 6 (six) hours as needed for wheezing or shortness of breath. 75 mL 1   ascorbic acid  (VITAMIN C ) 500  MG tablet Take 500 mg by mouth daily.     atomoxetine  (STRATTERA ) 60 MG capsule Take 1 capsule (60 mg total) by mouth daily. 90 capsule 1    Azelastine -Fluticasone  (DYMISTA ) 137-50 MCG/ACT SUSP PLACE 1 SPRAY INTO BOTH NOSTRILS 2 (TWO) TIMES DAILY 23 g 5   baclofen (LIORESAL) 10 MG tablet Take 10 mg by mouth 3 (three) times daily.     bisoprolol  (ZEBETA ) 5 MG tablet Take 1 tablet (5 mg total) by mouth daily. 90 tablet 2   budesonide -formoterol  (SYMBICORT ) 160-4.5 MCG/ACT inhaler Inhale 1 puff into the lungs daily. 10.2 g 5   busPIRone  (BUSPAR ) 10 MG tablet Take 1 tablet (10 mg total) by mouth 2 (two) times daily. 180 tablet 2   calcium  carbonate (OS-CAL) 1250 (500 Ca) MG chewable tablet Chew by mouth.     cariprazine  (VRAYLAR ) 1.5 MG capsule Take 1 capsule (1.5 mg total) by mouth daily. 30 capsule 2   Cholecalciferol 25 MCG (1000 UT) tablet Take 1 Units by mouth daily at 2 PM.     clobetasol  (TEMOVATE ) 0.05 % external solution Apply topically 2 (two) times daily as needed. 50 mL 3   clonazePAM  (KLONOPIN ) 0.5 MG tablet Take 1 tablet (0.5 mg total) by mouth 2 (two) times daily as needed for anxiety. 60 tablet 2   dicyclomine (BENTYL) 10 MG capsule Take 10 mg by mouth as needed for spasms.     EPINEPHRINE  0.3 mg/0.3 mL IJ SOAJ injection INJECT 0.3 MG INTO THE MUSCLE AS NEEDED FOR ANAPHYLAXIS. 2 each 2   fexofenadine  (ALLEGRA ) 180 MG tablet TAKE 1 TABLET BY MOUTH EVERY DAY 90 tablet 1   hydrOXYzine  (VISTARIL ) 25 MG capsule Take 1-2 capsules (25-50 mg total) by mouth daily as needed. 180 capsule 1   levothyroxine  (SYNTHROID ) 25 MCG tablet Take 1 tablet (25 mcg total) by mouth daily before breakfast. 90 tablet 1   LINZESS 72 MCG capsule Take 72 mcg by mouth daily.     Loratadine  (CLARITIN  PO) Take by mouth.     montelukast  (SINGULAIR ) 10 MG tablet TAKE 1 TABLET BY MOUTH EVERY DAY 90 tablet 1   Multiple Vitamin (MULTIVITAMIN) capsule Take by mouth.     norethindrone  (MICRONOR ) 0.35 MG tablet TAKE 1 TABLET BY MOUTH EVERY DAY 84 tablet 3   Olopatadine  HCl 0.2 % SOLN Apply 1 drop to eye every morning.     ondansetron (ZOFRAN-ODT) 4 MG  disintegrating tablet DISSOLVE ON TONGUE AT ONSET OF NAUSEA- 30 DAY SUPPLY 10 tablet 1   pantoprazole  (PROTONIX ) 40 MG tablet Take 1 tablet by mouth in the morning and at bedtime.     Polyethylene Glycol 3350  (MIRALAX  PO) Take by mouth as needed.     RESTASIS 0.05 % ophthalmic emulsion 1 drop 2 (two) times daily.     RIZATRIPTAN BENZOATE PO Take by mouth. (Patient taking differently: Take by mouth as needed (mirgraines).)     sertraline  (ZOLOFT ) 50 MG tablet Take 1.5 tablets (75 mg total) by mouth daily. 135 tablet 1   tirzepatide  (ZEPBOUND ) 2.5 MG/0.5ML Pen Inject 2.5 mg into the skin once a week. 6 mL 0   topiramate (TOPAMAX) 50 MG tablet Take 100 mg by mouth 2 (two) times daily.     VENTOLIN  HFA 108 (90 Base) MCG/ACT inhaler INHALE 2 PUFFS BY MOUTH EVERY 4 HOURS AS NEEDED FOR WHEEZE OR FOR SHORTNESS OF BREATH 18 each 1   No current facility-administered medications for this visit.     Musculoskeletal: Strength &  Muscle Tone: within normal limits Gait & Station: normal Patient leans: N/A  Psychiatric Specialty Exam: Review of Systems  All other systems reviewed and are negative.   There were no vitals taken for this visit.There is no height or weight on file to calculate BMI.  General Appearance: Casual and Fairly Groomed  Eye Contact:  Good  Speech:  Clear and Coherent  Volume:  Normal  Mood:  Euthymic  Affect:  Congruent  Thought Process:  Goal Directed  Orientation:  Full (Time, Place, and Person)  Thought Content: WDL   Suicidal Thoughts:  No  Homicidal Thoughts:  No  Memory:  Immediate;   Good Recent;   Good Remote;   NA  Judgement:  Good  Insight:  Fair  Psychomotor Activity:  Normal  Concentration:  Concentration: Good and Attention Span: Good  Recall:  Good  Fund of Knowledge: Good  Language: Good  Akathisia:  No  Handed:  Right  AIMS (if indicated): not done  Assets:  Communication Skills Desire for Improvement Resilience Social  Support Talents/Skills Vocational/Educational  ADL's:  Intact  Cognition: WNL  Sleep:  Good   Screenings: GAD-7    Flowsheet Row Office Visit from 05/13/2024 in [facility] Office Visit from 10/29/2023 in [facility] Office Visit from 09/14/2023 in [facility] Office Visit from 08/01/2023 in [facility] Office Visit from 05/18/2023 in [facility]  Total GAD-7 Score 0 0 0 1 0   PHQ2-9    Flowsheet Row Office Visit from 09/22/2024 in [facility] Office Visit from 07/25/2024 in [facility] Office Visit from 05/13/2024 in [facility] Office Visit from 04/04/2024 in [facility] Office Visit from 10/29/2023 in [facility]  PHQ-2 Total Score 0 0 0 0 0  PHQ-9 Total Score 1 -- 2 -- 1   Flowsheet Row Video Visit from 08/22/2022 in [facility] [place] Video Visit from 06/23/2022 in [facility] [place] Video Visit from 04/19/2022 in [facility] [place]  C-SSRS RISK CATEGORY No Risk No Risk No Risk     Assessment and Plan: This patient is a 23 year old female with a history of major depression, generalized anxiety disorder mood swings and ADD.  She is doing well on her current regimen.  She will continue Strattera  60 mg every morning for ADD, Vraylar  1.5 mg daily for mood stabilization, Zoloft  75 mg daily for depression and BuSpar  10 mg twice daily for anxiety.  She also uses hydroxyzine  25 to 50 mg daily as needed for anxiety.  Collaboration of Care: Collaboration of Care: Primary Care Provider AEB notes are shared with PCP on the epic system  Patient/Guardian was  advised Release of Information must be obtained prior to any record release in order to collaborate their care with an outside provider. Patient/Guardian was advised if they have not already done so to contact the registration department to sign all necessary forms in order for us  to release information regarding their care.   Consent: Patient/Guardian gives verbal consent for treatment and assignment of benefits for services provided during this visit. Patient/Guardian expressed understanding and agreed to proceed.    Barnie Gull, MD 11/04/2024, 11:40 AM

## 2024-11-06 ENCOUNTER — Ambulatory Visit: Payer: Self-pay

## 2024-11-06 NOTE — Telephone Encounter (Signed)
Noted  -LS

## 2024-11-06 NOTE — Telephone Encounter (Signed)
 FYI Only or Action Required?: FYI only for provider: appointment scheduled on tomorrow morning.  Patient was last seen in primary care on 09/30/2024 by Severa Rock HERO, FNP.  Called Nurse Triage reporting Dysuria.Back pain, abdominal pain  Symptoms began 1.5 weeks ago.  Interventions attempted: Prescription medications: Pt is currently taking Cipro  for skin infection.  Symptoms are: gradually worsening.  Triage Disposition: See HCP Within 4 Hours (Or PCP Triage)  Patient/caregiver understands and will follow disposition?: Yes                         Copied from CRM #[id number]. Topic: Clinical - Red Word Triage >> Nov 06, 2024 12:34 PM DeAngela L wrote: Red Word that prompted transfer to Nurse Triage: patient mom states the patient has been burning when urinates and body pains and she has flu like symptoms and the body, have a chills not feeling well and thinks this is also UTI concerns   Pt num [phone] (H)  Patient on speaker phone with her mother also Reason for Disposition  Side (flank) or lower back pain present  Answer Assessment - Initial Assessment Questions 1. SEVERITY: How bad is the pain?  (e.g., Scale 1-10; mild, moderate, or severe)     8/10 2. FREQUENCY: How many times have you had painful urination today?      Each time 3. PATTERN: Is pain present every time you urinate or just sometimes?      Each time 4. ONSET: When did the painful urination start?      1 week 5. FEVER: Do you have a fever? If Yes, ask: What is your temperature, how was it measured, and when did it start?     no 6. PAST UTI: Have you had a urine infection before? If Yes, ask: When was the last time? and What happened that time?      yes 7. CAUSE: What do you think is causing the painful urination?  (e.g., UTI, scratch, Herpes sore)     Uti - but not sure 8. OTHER SYMPTOMS: Do you have any other symptoms? (e.g., blood in urine, flank pain, genital  sores, urgency, vaginal discharge)     Flank pain, Abdominal pain Chills and does not feel well. Had blood on tissue when wiping 9. PREGNANCY: Is there any chance you are pregnant? When was your last menstrual period?     no  Protocols used: Urination Pain - Female-A-AH

## 2024-11-07 ENCOUNTER — Encounter: Payer: Self-pay | Admitting: Family Medicine

## 2024-11-07 ENCOUNTER — Encounter: Payer: Self-pay | Admitting: Allergy & Immunology

## 2024-11-07 ENCOUNTER — Ambulatory Visit: Payer: MEDICAID | Admitting: Family Medicine

## 2024-11-07 VITALS — BP 110/67 | HR 102 | Temp 97.7°F | Ht 59.0 in | Wt 234.6 lb

## 2024-11-07 DIAGNOSIS — N3000 Acute cystitis without hematuria: Secondary | ICD-10-CM

## 2024-11-07 DIAGNOSIS — B379 Candidiasis, unspecified: Secondary | ICD-10-CM

## 2024-11-07 LAB — MICROSCOPIC EXAMINATION
RBC, Urine: NONE SEEN /HPF (ref 0–2)
Renal Epithel, UA: NONE SEEN /HPF

## 2024-11-07 LAB — URINALYSIS, ROUTINE W REFLEX MICROSCOPIC
Bilirubin, UA: NEGATIVE
Glucose, UA: NEGATIVE
Ketones, UA: NEGATIVE
Nitrite, UA: NEGATIVE
Specific Gravity, UA: 1.025 (ref 1.005–1.030)
Urobilinogen, Ur: 0.2 mg/dL (ref 0.2–1.0)
pH, UA: 6 (ref 5.0–7.5)

## 2024-11-07 MED ORDER — NITROFURANTOIN MONOHYD MACRO 100 MG PO CAPS: 100.0000 mg | ORAL_CAPSULE | Freq: Two times a day (BID) | ORAL | 0 refills | Status: AC

## 2024-11-07 MED ORDER — FLUCONAZOLE 150 MG PO TABS: 150.0000 mg | ORAL_TABLET | Freq: Once | ORAL | 0 refills | Status: AC

## 2024-11-07 NOTE — Progress Notes (Signed)
 Acute Office Visit  Subjective:     Patient ID: Molly [name], female    DOB: [birth year], 23 y.o.   MRN: [id number]  Chief Complaint  Patient presents with   Dysuria    Dysuria     History of Present Illness   Molly [name] is a 23 year old female who presents with burning urination and lower abdominal pain.  Dysuria and lower abdominal pain - Burning sensation with urination for approximately 10 days - Lower abdominal pain and pressure - Symptoms initially worsened, now stabilized - Increased urinary frequency with decreased urine volume - Small amount of blood noted when wiping - No vaginal itching or discharge - No flank pain  Constitutional symptoms - Cold chills present - No fever  Urinary tract infection history - History of recurrent urinary tract infections - Recently completed a course of ciprofloxacin  two weeks ago for an abscess - Previous use of nitrofurantoin        Review of Systems  Genitourinary:  Positive for dysuria.   As per HPI.      Objective:    BP 110/67   Pulse (!) 102   Temp 97.7 F (36.5 C) (Temporal)   Ht 4' 11 (1.499 m)   Wt 234 lb 9.6 oz (106.4 kg)   SpO2 100%   BMI 47.38 kg/m    Physical Exam Vitals and nursing note reviewed.  Constitutional:      General: She is not in acute distress.    Appearance: She is obese. She is not ill-appearing, toxic-appearing or diaphoretic.  Cardiovascular:     Rate and Rhythm: Normal rate and regular rhythm.     Pulses: Normal pulses.     Heart sounds: Normal heart sounds. No murmur heard. Pulmonary:     Effort: Pulmonary effort is normal. No respiratory distress.  Abdominal:     Tenderness: There is abdominal tenderness in the suprapubic area. There is no right CVA tenderness, left CVA tenderness, guarding or rebound.  Musculoskeletal:     Right lower leg: No edema.     Left lower leg: No edema.  Skin:    General: Skin is warm and dry.  Neurological:      General: No focal deficit present.     Mental Status: She is alert and oriented to person, place, and time.  Psychiatric:        Mood and Affect: Mood normal.        Behavior: Behavior normal.     No results found for any visits on 11/07/24.      Assessment & Plan:   Molly [name] was seen today for dysuria.  Diagnoses and all orders for this visit:  Acute cystitis without hematuria -     Urinalysis, Routine w reflex microscopic -     nitrofurantoin , macrocrystal-monohydrate, (MACROBID ) 100 MG capsule; Take 1 capsule (100 mg total) by mouth 2 (two) times daily for 5 days. 1 po BId -     Urine Culture  Yeast detected -     fluconazole  (DIFLUCAN ) 150 MG tablet; Take 1 tablet (150 mg total) by mouth once for 1 dose. Repeat after completing antibiotic.   Assessment and Plan    Acute cystitis Symptoms and urinalysis suggest UTI. Yeast presence indicates possible concurrent vulvovaginal candidiasis. Recent antibiotics may have contributed to yeast infection. Culture pending for confirmation and antibiotic guidance. - Prescribed Macrobid  100 mg BID for 5 days. - Obtained urine culture. - Sent prescription to pharmacy.  Vulvovaginal  candidiasis Yeast in urinalysis suggests candidiasis, likely due to recent antibiotic use. Symptoms include irritation and bleeding. - Prescribed Diflucan  with instructions for one dose now and another in one week.      Return if symptoms worsen or fail to improve.  The patient indicates understanding of these issues and agrees with the plan.  Annabella CHRISTELLA Search, FNP

## 2024-11-09 LAB — URINE CULTURE

## 2024-11-10 ENCOUNTER — Ambulatory Visit: Payer: Self-pay | Admitting: Family Medicine

## 2024-11-10 NOTE — Telephone Encounter (Signed)
 Can we have patient come in for a wet prep?

## 2024-11-12 ENCOUNTER — Other Ambulatory Visit: Payer: Self-pay | Admitting: Family Medicine

## 2024-11-12 DIAGNOSIS — J301 Allergic rhinitis due to pollen: Secondary | ICD-10-CM

## 2024-11-14 ENCOUNTER — Encounter: Payer: Self-pay | Admitting: Allergy & Immunology

## 2024-11-14 ENCOUNTER — Ambulatory Visit: Payer: MEDICAID | Admitting: Allergy & Immunology

## 2024-11-14 ENCOUNTER — Other Ambulatory Visit: Payer: Self-pay

## 2024-11-14 VITALS — BP 122/88 | HR 96 | Temp 98.1°F | Ht 59.45 in | Wt 233.8 lb

## 2024-11-14 DIAGNOSIS — B999 Unspecified infectious disease: Secondary | ICD-10-CM | POA: Diagnosis not present

## 2024-11-14 DIAGNOSIS — L2084 Intrinsic (allergic) eczema: Secondary | ICD-10-CM

## 2024-11-14 DIAGNOSIS — J302 Other seasonal allergic rhinitis: Secondary | ICD-10-CM | POA: Diagnosis not present

## 2024-11-14 DIAGNOSIS — J4540 Moderate persistent asthma, uncomplicated: Secondary | ICD-10-CM | POA: Diagnosis not present

## 2024-11-14 DIAGNOSIS — T7800XD Anaphylactic reaction due to unspecified food, subsequent encounter: Secondary | ICD-10-CM | POA: Diagnosis not present

## 2024-11-14 DIAGNOSIS — J3089 Other allergic rhinitis: Secondary | ICD-10-CM | POA: Diagnosis not present

## 2024-11-14 MED ORDER — EPINEPHRINE 0.3 MG/0.3ML IJ SOAJ: 0.3000 mg | INTRAMUSCULAR | 2 refills | Status: AC | PRN

## 2024-11-14 MED ORDER — BUDESONIDE-FORMOTEROL FUMARATE 160-4.5 MCG/ACT IN AERO: 2.0000 | INHALATION_SPRAY | Freq: Two times a day (BID) | RESPIRATORY_TRACT | 5 refills | Status: AC

## 2024-11-14 MED ORDER — AZELASTINE-FLUTICASONE 137-50 MCG/ACT NA SUSP: 2.0000 | Freq: Every day | NASAL | 5 refills | Status: AC

## 2024-11-14 MED ORDER — CLOBETASOL PROPIONATE 0.05 % EX SOLN: Freq: Two times a day (BID) | CUTANEOUS | 3 refills | Status: AC | PRN

## 2024-11-14 MED ORDER — MONTELUKAST SODIUM 10 MG PO TABS: 10.0000 mg | ORAL_TABLET | Freq: Every day | ORAL | 1 refills | Status: AC

## 2024-11-14 NOTE — Patient Instructions (Addendum)
 1. Intrinsic atopic dermatitis  - Continue with the triamcinolone  ointment as needed.  - You can use the lotion as long as the lotion does not bother your skin.  - Continue with moisturizing twice daily.   2. Moderate persistent asthma, uncomplicated - Lung testing not done today. - Because of the coughing, let's increase the Symbicort  to two puffs twice daily EVERY DAY to see if that helps.  - I would consider Tezspire (handout provided). - This would help to decrease your reactivity things like viruses and other triggers.  - Daily controller medication(s): Symbicort  160/4.5 mcg one puff once daily - Prior to physical activity: albuterol  2 puffs 10-15 minutes before physical activity. - Rescue medications: albuterol  4 puffs every 4-6 hours as needed and albuterol  nebulizer one vial every 4-6 hours as needed - Asthma control goals:  * Full participation in all desired activities (may need albuterol  before activity) * Albuterol  use two time or less a week on average (not counting use with activity) * Cough interfering with sleep two time or less a month * Oral steroids no more than once a year * No hospitalizations  3. Chronic allergic rhinitis (indoor molds, outdoor molds, dust mites and cat) - Your nose looks great today!  - Continue with: Singulair  (montelukast ) 10mg  daily and Dymista  (fluticasone /azelastine ) two sprays per nostril 1-2 times daily as needed  - Continue with: Allegra  and Claritin  as you are doing.  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  5. Recurrent infections - Keep track of infections, antibiotic use and steroid use - We are going to get a  new immune workup at this time.  6. Return in about 3 months (around 02/12/2025). You can have the follow up appointment with Dr. Iva or a Nurse Practicioner (our Nurse Practitioners are excellent and always have Physician oversight!).    Please inform us  of any Emergency Department visits, hospitalizations, or changes in symptoms. Call us  before going to the ED for breathing or allergy  symptoms since we might be able to fit you in for a sick visit. Feel free to contact us  anytime with any questions, problems, or concerns.  It was a pleasure to see you and your family again today!  Websites that have reliable patient information: 1. American Academy of Asthma, Allergy , and Immunology: www.aaaai.org 2. Food Allergy  Research and Education (FARE): foodallergy.org 3. Mothers of Asthmatics: http://www.asthmacommunitynetwork.org 4. American College of Allergy , Asthma, and Immunology: www.acaai.org      Like us  on [employer] and Instagram for our latest updates!      A healthy democracy works best when [employer] participate! Make sure you are registered to vote! If you have moved or changed any of your contact information, you will need to get this updated before voting! Scan the QR codes below to learn more!

## 2024-11-14 NOTE — Progress Notes (Unsigned)
 FOLLOW UP  Date of Service/Encounter:  11/14/2024   Assessment:   Recurrent infections - Plan: Complement, total, Diphtheria / Tetanus Antibody Panel, IgG, IgA, IgM, Strep pneumoniae 23 Serotypes IgG, CBC With Diff/Platelet, IgG 1, 2, 3, and 4  Moderate persistent asthma without complication - Plan: Alpha-1-antitrypsin, ANCA TITERS, Aspergillus Precipitins, IgE  Current episode of lock jaw  Plan/Recommendations:   Patient Instructions  1. Intrinsic atopic dermatitis  - Continue with the triamcinolone  ointment as needed.  - You can use the lotion as long as the lotion does not bother your skin.  - Continue with moisturizing twice daily.   2. Moderate persistent asthma, uncomplicated - Lung testing not done today. - Because of the coughing, let's increase the Symbicort  to two puffs twice daily EVERY DAY to see if that helps.  - I would consider Tezspire (handout provided). - This would help to decrease your reactivity things like viruses and other triggers.  - Daily controller medication(s): Symbicort  160/4.5 mcg one puff once daily - Prior to physical activity: albuterol  2 puffs 10-15 minutes before physical activity. - Rescue medications: albuterol  4 puffs every 4-6 hours as needed and albuterol  nebulizer one vial every 4-6 hours as needed - Asthma control goals:  * Full participation in all desired activities (may need albuterol  before activity) * Albuterol  use two time or less a week on average (not counting use with activity) * Cough interfering with sleep two time or less a month * Oral steroids no more than once a year * No hospitalizations  3. Chronic allergic rhinitis (indoor molds, outdoor molds, dust mites and cat) - Your nose looks great today!  - Continue with: Singulair  (montelukast ) 10mg  daily and Dymista  (fluticasone /azelastine ) two sprays per nostril 1-2 times daily as needed  - Continue with: Allegra  and Claritin  as you are doing.  - Consider nasal saline  rinses 1-2 times daily to remove allergens from the nasal cavities as well as help with mucous clearance (this is especially helpful to do before the nasal sprays are given)  4. 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  5. Recurrent infections - Keep track of infections, antibiotic use and steroid use - We are going to get a new immune workup at this time.  6. Return in about 3 months (around 02/12/2025). You can have the follow up appointment with Dr. Iva or a Nurse Practicioner (our Nurse Practitioners are excellent and always have Physician oversight!).    Please inform us  of any Emergency Department visits, hospitalizations, or changes in symptoms. Call us  before going to the ED for breathing or allergy  symptoms since we might be able to fit you in for a sick visit. Feel free to contact us  anytime with any questions, problems, or concerns.  It was a pleasure to see you and your family again today!  Websites that have reliable patient information: 1. American Academy of Asthma, Allergy , and Immunology: www.aaaai.org 2. Food Allergy  Research and Education (FARE): foodallergy.org 3. Mothers of Asthmatics: http://www.asthmacommunitynetwork.org 4. [employer] of Allergy , Asthma, and Immunology: www.acaai.org      Like us  on [employer] and Instagram for our latest updates!      A healthy democracy works best when [employer] participate! Make sure you are registered to vote! If you have moved or changed any of your contact information, you will need to get this updated before voting!  Scan the QR codes below to learn more!             Subjective:   BRYLINN TEANEY is a 23 y.o. female presenting today for follow up of  Chief Complaint  Patient presents with   Asthma   Follow-up    ZAYNAH CHAWLA has a history of the  following: Patient Active Problem List   Diagnosis Date Noted   Breast pain, left 06/19/2024   Recurrent infections 05/02/2024   Moderate persistent asthma without complication 05/02/2024   Hypothyroidism 10/11/2023   Pelvic pain 09/12/2023   Bilateral low back pain with right-sided sciatica 09/12/2023   Prediabetes 05/10/2023   LLQ pain 03/06/2023   PCO (polycystic ovaries) 03/06/2023   Encounter for menstrual regulation 03/06/2023   ASD (atrial septal defect) 11/22/2022   Morbid obesity (HCC) 10/11/2022   B12 deficiency 10/11/2022   Hyperhidrosis 10/11/2022   Palpitations 10/02/2022   Piriformis syndrome 08/11/2021   Obstructive sleep apnea syndrome 06/10/2020   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   Insomnia 06/10/2020   Sleep paralysis 06/10/2020   Dysmenorrhea 03/23/2020   Menorrhagia with irregular cycle 03/23/2020   Fatigue 03/23/2020   ADHD (attention deficit hyperactivity disorder) 03/09/2020   Unexplained dysphagia 01/21/2020   Anxiety 10/23/2019   Eustachian tube dysfunction, left 10/23/2019   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 04/01/2019   Obesity without serious comorbidity with body mass index (BMI) in 95th to 98th percentile for age in pediatric patient 09/04/2018   PTSD (post-traumatic stress disorder) 03/07/2018   MDD (major depressive disorder), severe (HCC) 03/06/2018   Keratosis pilaris 01/15/2018   Severe persistent asthma, uncomplicated (HCC) 01/15/2018   Intrinsic atopic dermatitis 01/15/2018   Seasonal and perennial allergic rhinitis 01/15/2018   Anaphylactic shock due to adverse food reaction 01/15/2018   Tic disorder 2019   Scoliosis 2019   Adverse food reaction 07/11/2016   Chronic migraine w/o aura w/o status migrainosus, not intractable 05/07/2012   Hyperlipidemia 2013   History of atrial septal defect repair 12/04/2011   Idiopathic urticaria 09/05/2011   Chronic constipation 09/05/2011   Hypertension 2011    History obtained from: chart  review and {Persons; PED relatives w/patient:19415::patient}.  Discussed the use of AI scribe software for clinical note transcription with the patient and/or guardian, who gave verbal consent to proceed.  Kadeidra is a 23 y.o. female presenting for {Blank single:19197::a food challenge,a drug challenge,skin testing,a sick visit,an evaluation of ***,a follow up visit}.  She has been experiencing worsening asthma symptoms, including random asthma flares and difficulty breathing. Episodes last about five minutes and are relieved by albuterol . She uses Symbicort  two puffs once a day. 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 Her symptoms have worsened since starting this job last year. She is on prednisone , currently taking 30 mg daily, although she was supposed to take 40 mg. Prednisone  affects her heart rate, causing it to increase significantly.  She reports that a previous immune workup in 2022 was 'a little weird,' and that she has been told she is immunocompromised. She has been diagnosed with idiopathic intracranial hypertension (IIH) and postural orthostatic tachycardia syndrome (POTS) since 2020. Her POTS symptoms were particularly severe during the summer, leading to frequent fainting episodes.  Her family history is significant for irritable bowel syndrome (IBS), which affects her mother and brother.  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  She is currently on Singulair  and Dymista  for allergies. She avoids fish due to a past reaction and has had a negative test for blueberries, which she can now consume without issues.  Asthma/Respiratory Symptom History: ***. She has been having intense coughing spells. She has been using her emergency inhaler and has not been able to breathe for five minutes.   Allergic  Rhinitis Symptom History: ***  Food Allergy  Symptom History: ***  Skin Symptom History: ***  GERD Symptom History: ***  Infection Symptom History: ***  Otherwise, there have been no changes to her past medical history, surgical history, family history, or social history.    Review of systems otherwise negative other than that mentioned in the HPI.    Objective:   There were no vitals taken for this visit. There is no height or weight on file to calculate BMI.    Physical Exam   Diagnostic studies: {Blank single:19197::none,deferred due to recent antihistamine use,deferred due to insurance stipulations that require a separate visit for testing,labs sent instead, }  Spirometry: {Blank single:19197::results normal (FEV1: ***%, FVC: ***%, FEV1/FVC: ***%),results abnormal (FEV1: ***%, FVC: ***%, FEV1/FVC: ***%)}.    {Blank single:19197::Spirometry consistent with mild obstructive disease,Spirometry consistent with moderate obstructive disease,Spirometry consistent with severe obstructive disease,Spirometry consistent with possible restrictive disease,Spirometry consistent with mixed obstructive and restrictive disease,Spirometry uninterpretable due to technique,Spirometry consistent with normal pattern}. {Blank single:19197::Albuterol /Atrovent nebulizer,Xopenex /Atrovent nebulizer,Albuterol  nebulizer,Albuterol  four puffs via MDI,Xopenex  four puffs via MDI} treatment given in clinic with {Blank single:19197::significant improvement in FEV1 per ATS criteria,significant improvement in FVC per ATS criteria,significant improvement in FEV1 and FVC per ATS criteria,improvement in FEV1, but not significant per ATS criteria,improvement in FVC, but not significant per ATS criteria,improvement in FEV1 and FVC, but not significant per ATS criteria,no improvement}.  Allergy  Studies: {Blank single:19197::none,deferred due to recent antihistamine  use,deferred due to insurance stipulations that require a separate visit for testing,labs sent instead, }    {Blank single:19197::Allergy  testing results were read and interpreted by myself, documented by clinical staff., }      Marty Shaggy, MD  [facility] 

## 2024-11-19 ENCOUNTER — Encounter: Payer: Self-pay | Admitting: Family Medicine

## 2024-11-19 ENCOUNTER — Encounter: Payer: Self-pay | Admitting: "Endocrinology

## 2024-11-19 ENCOUNTER — Ambulatory Visit: Payer: MEDICAID | Admitting: Allergy & Immunology

## 2024-11-26 ENCOUNTER — Ambulatory Visit: Payer: MEDICAID | Admitting: Family Medicine

## 2024-11-26 VITALS — BP 127/84 | HR 100 | Temp 97.6°F | Ht 59.0 in | Wt 230.0 lb

## 2024-11-26 DIAGNOSIS — R051 Acute cough: Secondary | ICD-10-CM | POA: Diagnosis not present

## 2024-11-26 DIAGNOSIS — J3489 Other specified disorders of nose and nasal sinuses: Secondary | ICD-10-CM

## 2024-11-26 DIAGNOSIS — R52 Pain, unspecified: Secondary | ICD-10-CM

## 2024-11-26 DIAGNOSIS — J0100 Acute maxillary sinusitis, unspecified: Secondary | ICD-10-CM | POA: Diagnosis not present

## 2024-11-26 LAB — VERITOR SARS-COV-2 AND FLU A+B
BD Veritor SARS-CoV-2 Ag: NEGATIVE
Influenza A: NEGATIVE
Influenza B: NEGATIVE

## 2024-11-26 LAB — RSV AG, IMMUNOCHR, WAIVED: RSV Ag, Immunochr, Waived: NEGATIVE

## 2024-11-26 MED ORDER — AMOXICILLIN-POT CLAVULANATE 875-125 MG PO TABS: 1.0000 | ORAL_TABLET | Freq: Two times a day (BID) | ORAL | 0 refills | Status: AC

## 2024-11-26 MED ORDER — BETAMETHASONE SOD PHOS & ACET 6 (3-3) MG/ML IJ SUSP
6.0000 mg | Freq: Once | INTRAMUSCULAR | Status: AC
  Administered 2024-11-26: 6 mg via INTRAMUSCULAR

## 2024-11-26 MED ORDER — PSEUDOEPHEDRINE-GUAIFENESIN ER 120-1200 MG PO TB12: 1.0000 | ORAL_TABLET | Freq: Two times a day (BID) | ORAL | 0 refills | Status: AC

## 2024-11-26 NOTE — Progress Notes (Signed)
 "  Subjective:  Patient ID: Molly [name], female    DOB: [birth year]  Age: 23 y.o. MRN: [id number]  CC: URI   HPI  Discussed the use of AI scribe software for clinical note transcription with the patient, who gave verbal consent to proceed.  History of Present Illness Molly [name] is a 23 year old female with migraines who presents with worsening headache and sinus symptoms.  Her current symptoms began with a severe migraine unresponsive to her usual medication. She sought care at urgent care and was advised to go to the hospital, where she received a migraine cocktail via IV. A CT scan was performed, which was normal. She was told by hospital staff that she had sinus congestion, but it was not treated.  Since the hospital visit, her symptoms have worsened. She experiences worsening body aches, cold chills, and a persistent headache, which she describes as a 'deep, dull, achy' pain over her forehead and cheeks, distinct from her typical migraines. She also has sinus issues, including a runny nose with yellow discharge, chest pain, and a cough producing yellow sputum. Her throat is sore, and she has experienced feverish sensations, though she does not recall the exact temperature.  She was diagnosed with a left ear infection at urgent care and prescribed Ciprodex  ear drops, to be administered as four drops in the left ear twice daily. She denies any other current medications for her sinus symptoms but has been taking over-the-counter cold pills, both daytime and nighttime formulations.  In the review of symptoms, she confirms experiencing shortness of breath, fever, and tenderness on the left side of her face. She is allergic to shellfish and sulfur, with the latter being a recent development.          11/26/2024   10:49 AM 11/07/2024   10:52 AM 09/22/2024    2:33 PM  Depression screen PHQ 2/9  Decreased Interest 0 0 0  Down, Depressed, Hopeless 0 0 0  PHQ - 2  Score 0 0 0  Altered sleeping  0 0  Tired, decreased energy  0 1  Change in appetite  0 0  Feeling bad or failure about yourself   0 0  Trouble concentrating  0 0  Moving slowly or fidgety/restless  0 0  Suicidal thoughts  0 0  PHQ-9 Score  0 1   Difficult doing work/chores  Not difficult at all Not difficult at all     Data saved with a previous flowsheet row definition    History Molly [name] has a past medical history of Acid reflux (2010), Acne vulgaris (2017), ADHD (attention deficit hyperactivity disorder) (2011), Adverse food reaction (07/11/2016), Amenorrhea (2019), Anaphylactic shock due to adverse food reaction (01/15/2018), Anxiety (2017), ASD (atrial septal defect) (11/22/2022), Chronic constipation (09/05/2011), Chronic tension headaches (2011), Coalition, calcaneus navicular, Dysmenorrhea, Eczema, Eustachian tube dysfunction, left (10/23/2019), Frequent headaches, Galactorrhea, 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 (04/01/2019), Hiatal hernia, History of atrial septal defect repair (12/04/2011), Hyperlipidemia (2013), Hypertension (2011), Hypertension (09/28/2020), Idiopathic urticaria (09/05/2011), Insomnia, Keratosis pilaris (01/15/2018), Mass of left thigh (11/22/2017), MDD (major depressive disorder), severe (HCC) (03/06/2018), Migraine (2011), Obesity without serious comorbidity with body mass index (BMI) in 95th to 98th percentile for age in pediatric patient (09/04/2018), PCOS (polycystic ovarian syndrome), Piriformis syndrome, POTS (postural orthostatic tachycardia syndrome), PTSD (post-traumatic stress disorder), Scoliosis (2019), Seasonal and perennial allergic rhinitis (2010), Severe persistent asthma without complication (HCC) (2010), Sleep apnea, Sleep paralysis, Suicidal ideation (03/07/2018), Thyroid  disease, and Tic disorder (2019).  She has a past surgical history that includes ASD repair (2012); lipoma removal (2019); Dental surgery (2020); Tonsillectomy and adenoidectomy  (2010); Lumbar puncture (05/20/2021); Cardiac surgery; and atrial septal defecgt.   Her family history includes ADD / ADHD in her brother and father; Alcohol  abuse in her father; Allergic rhinitis in her brother and mother; Anxiety disorder in her mother; Arthritis in her mother; Asthma in her brother and mother; Bipolar disorder in her father, maternal grandfather, and mother; Depression in her mother; Diabetes in her mother; Drug abuse in her father; Heart attack in her maternal grandfather; Heart disease in her maternal grandmother; Hyperlipidemia in her maternal grandmother and mother; Hypertension in her maternal grandmother and mother; Learning disabilities in her brother; Mental illness in her father and mother; Miscarriages / Stillbirths in her mother; Thyroid  disease in her mother.She reports that she has never smoked. She has never used smokeless tobacco. She reports that she does not drink alcohol  and does not use drugs.    ROS Review of Systems  Constitutional:  Negative for activity change, appetite change, chills and fever.  HENT:  Positive for congestion, postnasal drip, rhinorrhea and sinus pressure. Negative for ear discharge, ear pain, hearing loss, nosebleeds, sneezing and trouble swallowing.   Respiratory:  Negative for chest tightness and shortness of breath.   Cardiovascular:  Negative for chest pain and palpitations.  Skin:  Negative for rash.    Objective:  BP 127/84   Pulse 100   Temp 97.6 F (36.4 C)   Ht 4' 11 (1.499 m)   Wt 230 lb (104.3 kg)   SpO2 96%   BMI 46.45 kg/m   BP Readings from Last 3 Encounters:  11/26/24 127/84  11/14/24 122/88  11/07/24 110/67    Wt Readings from Last 3 Encounters:  11/26/24 230 lb (104.3 kg)  11/14/24 233 lb 12.8 oz (106.1 kg)  11/07/24 234 lb 9.6 oz (106.4 kg)     Physical Exam HENT:     Nose: Congestion present.    Physical Exam GENERAL: Alert, cooperative, well developed, no acute distress HEENT:  Normocephalic, normal oropharynx, moist mucous membranes, tenderness over left sinus CHEST: Clear to auscultation bilaterally, no wheezes, rhonchi, or crackles CARDIOVASCULAR: Normal heart rate and rhythm, S1 and S2 normal without murmurs ABDOMEN: Soft, non-tender, non-distended, without organomegaly, normal bowel sounds EXTREMITIES: No cyanosis or edema NEUROLOGICAL: Cranial nerves grossly intact, moves all extremities without gross motor or sensory deficit   Assessment & Plan:  Acute maxillary sinusitis, recurrence not specified -     Betamethasone  Sod Phos & Acet  Acute cough -     Veritor SARS-CoV-2 and Flu A+B -     RSV Ag, Immunochr, Waived  Nasal drainage -     Veritor SARS-CoV-2 and Flu A+B -     RSV Ag, Immunochr, Waived  Body aches -     Veritor SARS-CoV-2 and Flu A+B -     RSV Ag, Immunochr, Waived  Other orders -     Amoxicillin -Pot Clavulanate; Take 1 tablet by mouth 2 (two) times daily. Take all of this medication  Dispense: 20 tablet; Refill: 0 -     Pseudoephedrine -guaiFENesin  ER; Take 1 tablet by mouth 2 (two) times daily. For congestion  Dispense: 20 tablet; Refill: 0    Assessment and Plan Assessment & Plan Acute maxillary sinusitis   She presents with headache, sinus congestion, yellow nasal discharge, chest pain, cough with yellow sputum, sore throat, fever, chills, and shortness of breath. Symptoms have worsened  since the initial presentation. A CT scan showed sinus congestion without a definitive viral diagnosis. The differential includes bacterial sinusitis due to the severity and persistence of symptoms, despite urgent care's initial suggestion of a viral etiology. Administered a cortisone shot and prescribed a decongestant and a strong antibiotic. Advised to continue cold pills, switching to a nighttime formulation if the current regimen disrupts sleep.       Follow-up: No follow-ups on file.  Butler Der, M.D. "

## 2024-11-30 ENCOUNTER — Encounter: Payer: Self-pay | Admitting: Family Medicine

## 2024-11-30 ENCOUNTER — Ambulatory Visit: Payer: Self-pay | Admitting: Family Medicine

## 2024-12-02 DIAGNOSIS — J4550 Severe persistent asthma, uncomplicated: Secondary | ICD-10-CM | POA: Diagnosis not present

## 2024-12-10 ENCOUNTER — Encounter: Payer: Self-pay | Admitting: Allergy & Immunology

## 2024-12-11 ENCOUNTER — Other Ambulatory Visit: Payer: Self-pay | Admitting: Adult Health

## 2024-12-19 ENCOUNTER — Other Ambulatory Visit: Payer: MEDICAID

## 2024-12-24 LAB — ASPERGILLUS PRECIPITINS

## 2024-12-24 LAB — ANCA TITERS
Atypical pANCA: <1:20 {titer}
C-ANCA: <1:20 {titer}
P-ANCA: <1:20 {titer}

## 2024-12-24 LAB — ALPHA-1-ANTITRYPSIN: A-1 Antitrypsin: 139 mg/dL (ref 100–188)

## 2024-12-24 LAB — IGE: IgE (Immunoglobulin E), Serum: 120 [IU]/mL (ref 6–495)

## 2024-12-25 ENCOUNTER — Ambulatory Visit: Payer: Self-pay | Admitting: Allergy & Immunology

## 2024-12-26 ENCOUNTER — Ambulatory Visit: Payer: MEDICAID | Admitting: "Endocrinology

## 2024-12-26 ENCOUNTER — Encounter: Payer: Self-pay | Admitting: "Endocrinology

## 2024-12-26 VITALS — BP 104/62 | HR 84 | Ht 59.0 in | Wt 239.0 lb

## 2024-12-26 DIAGNOSIS — E782 Mixed hyperlipidemia: Secondary | ICD-10-CM | POA: Diagnosis not present

## 2024-12-26 DIAGNOSIS — E039 Hypothyroidism, unspecified: Secondary | ICD-10-CM | POA: Diagnosis not present

## 2024-12-26 DIAGNOSIS — R7303 Prediabetes: Secondary | ICD-10-CM | POA: Diagnosis not present

## 2024-12-26 MED ORDER — LEVOTHYROXINE SODIUM 25 MCG PO TABS: 25.0000 ug | ORAL_TABLET | Freq: Every day | ORAL | 1 refills | Status: AC

## 2024-12-26 NOTE — Progress Notes (Signed)
 "                                                      12/26/2024, 1:24 PM  Endocrinology follow-up note   Subjective:    Patient ID: Molly [name], female    DOB: August 09, 2001, PCP [name], FNP   Past Medical History:  Diagnosis Date   Acid reflux 2010   Acne vulgaris 2017   ADHD (attention deficit hyperactivity disorder) 2011   Adverse food reaction 07/11/2016   Shellfish allergy    Amenorrhea 2019   Anaphylactic shock due to adverse food reaction 01/15/2018   Anxiety 2017   ASD (atrial septal defect) 11/22/2022   Chronic constipation 09/05/2011   Chronic tension headaches 2011   originally followed by [facility] K.Griffin. Then Dr [facility] 03/2020 (see above)    Coalition, calcaneus navicular    Dysmenorrhea    Eczema    Eustachian tube dysfunction, left 10/23/2019   Frequent headaches    Galactorrhea    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 04/01/2019   Hiatal hernia    History of atrial septal defect repair 12/04/2011   Hyperlipidemia 2013   Hypertension 2011   Hypertension 09/28/2020   hypertension, cellulitus    Idiopathic urticaria 09/05/2011   Insomnia    Keratosis pilaris 01/15/2018   Mass of left thigh 11/22/2017   Overview:  Added automatically from request for surgery [id number]   MDD (major depressive disorder), severe (HCC) 03/06/2018   Migraine 2011   re-diagnosed as chronic migraines 03/2020 [facility] - starting on preventative    Obesity without serious comorbidity with body mass index (BMI) in 95th to 98th percentile for age in pediatric patient 09/04/2018   PCOS (polycystic ovarian syndrome)    Piriformis syndrome    POTS (postural orthostatic tachycardia syndrome)    PTSD (post-traumatic stress disorder)    Scoliosis 2019   Seasonal and perennial allergic rhinitis 2010   Severe persistent asthma without complication (HCC) 2010   Sleep apnea    Sleep paralysis    Suicidal ideation 03/07/2018   Thyroid  disease    underactive thyroid     Tic disorder 2019   Past Surgical History:  Procedure Laterality Date   ASD REPAIR  2012   with Helix, via Cardiac Catheterization   atrial septal defecgt     CARDIAC SURGERY     DENTAL SURGERY  2020   lipoma removal  2019   LUMBAR PUNCTURE  05/20/2021   TONSILLECTOMY AND ADENOIDECTOMY  2010   Social History   Socioeconomic History   Marital status: Significant Other    Spouse name: Not on file   Number of children: Not on file   Years of education: Not on file   Highest education level: Not on file  Occupational History   Not on file  Tobacco Use   Smoking status: Never   Smokeless tobacco: Never  Vaping Use   Vaping status: Never Used  Substance and Sexual Activity   Alcohol  use: No    Alcohol /week: 0.0 standard drinks of alcohol    Drug use: No   Sexual activity: Never    Birth control/protection: Pill  Other Topics Concern   Not on file  Social History Narrative   Not on file   Social Drivers of Health   Tobacco Use: Low Risk (  12/26/2024)   Patient History    Smoking Tobacco Use: Never    Smokeless Tobacco Use: Never    Passive Exposure: Not on file  Financial Resource Strain: Low Risk (07/04/2024)   Received from [facility]   Overall Financial Resource Strain (CARDIA)    How hard is it for you to pay for the very basics like food, housing, medical care, and heating?: Not hard at all  Food Insecurity: No Food Insecurity (07/04/2024)   Received from [facility]   Epic    Within the past 12 months, you worried that your food would run out before you got the money to buy more.: Never true    Within the past 12 months, the food you bought just didn't last and you didn't have money to get more.: Never true  Transportation Needs: No Transportation Needs (07/04/2024)   Received from [facility] - Transportation    Lack of Transportation (Medical): No    Lack of Transportation (Non-Medical): No  Physical Activity: Sufficiently Active  (07/04/2024)   Received from [facility]   Exercise Vital Sign    On average, how many days per week do you engage in moderate to strenuous exercise (like a brisk walk)?: 4 days    On average, how many minutes do you engage in exercise at this level?: 150+ min  Stress: No Stress Concern Present (07/04/2024)   Received from [facility] - Occupational Stress Questionnaire    Do you feel stress - tense, restless, nervous, or anxious, or unable to sleep at night because your mind is troubled all the time - these days?: Not at all  Social Connections: Socially Isolated (07/04/2024)   Received from [facility]   Social Connection and Isolation Panel    In a typical week, how many times do you talk on the phone with family, friends, or neighbors?: Twice a week    How often do you get together with friends or relatives?: Once a week    How often do you attend church or religious services?: Never    Do you belong to any clubs or organizations such as church groups, unions, fraternal or athletic groups, or school groups?: No    How often do you attend meetings of the clubs or organizations you belong to?: Never    Are you married, widowed, divorced, separated, never married, or living with a partner?: Never married  Depression (PHQ2-9): Low Risk (11/26/2024)   Depression (PHQ2-9)    PHQ-2 Score: 0  Alcohol  Screen: Low Risk (03/06/2023)   Alcohol  Screen    Last Alcohol  Screening Score (AUDIT): 0  Housing: Low Risk (01/21/2024)   Received from [facility]   Epic    What is your living situation today?: I have a steady place to live    Think about the place you live. Do you have problems with any of the following? Choose all that apply:: None/None on this list  Utilities: Low Risk (07/04/2024)   Received from [facility]   Utilities    Within the past 12 months, have you been unable to get utilities(heat, electricity) when it was really needed?: No   Health Literacy: Medium Risk (07/04/2024)   Received from [facility] Literacy    How often do you need to have someone help you when you read instructions, pamphlets, or other written material from your doctor  or pharmacy?: Sometimes   Family History  Problem Relation Age of Onset   Hypertension Mother    Hyperlipidemia Mother    Diabetes Mother    Depression Mother    Arthritis Mother    Allergic rhinitis Mother    Asthma Mother    Bipolar disorder Mother    Anxiety disorder Mother    Mental illness Mother    Miscarriages / Stillbirths Mother    Thyroid  disease Mother    Bipolar disorder Father    Drug abuse Father    Alcohol  abuse Father    ADD / ADHD Father    Mental illness Father    Allergic rhinitis Brother    Asthma Brother    ADD / ADHD Brother    Learning disabilities Brother    Hypertension Maternal Grandmother    Hyperlipidemia Maternal Grandmother    Heart disease Maternal Grandmother    Heart attack Maternal Grandfather    Bipolar disorder Maternal Grandfather    Angioedema Neg Hx    Eczema Neg Hx    Immunodeficiency Neg Hx    Urticaria Neg Hx    Outpatient Encounter Medications as of 12/26/2024  Medication Sig   AJOVY 225 MG/1.5ML SOAJ Inject 225 mg into the skin.   albuterol  (PROVENTIL ) (2.5 MG/3ML) 0.083% nebulizer solution Take 3 mLs (2.5 mg total) by nebulization every 6 (six) hours as needed for wheezing or shortness of breath.   amoxicillin -clavulanate (AUGMENTIN ) 875-125 MG tablet Take 1 tablet by mouth 2 (two) times daily. Take all of this medication   ascorbic acid  (VITAMIN C ) 500 MG tablet Take 500 mg by mouth daily.   atomoxetine  (STRATTERA ) 60 MG capsule Take 1 capsule (60 mg total) by mouth daily.   Azelastine -Fluticasone  (DYMISTA ) 137-50 MCG/ACT SUSP Place 2 sprays into the nose daily.   baclofen (LIORESAL) 10 MG tablet Take 10 mg by mouth 3 (three) times daily.   bisoprolol  (ZEBETA ) 5 MG tablet Take 1 tablet (5 mg total) by  mouth daily.   budesonide -formoterol  (SYMBICORT ) 160-4.5 MCG/ACT inhaler Inhale 2 puffs into the lungs in the morning and at bedtime.   busPIRone  (BUSPAR ) 10 MG tablet Take 1 tablet (10 mg total) by mouth 2 (two) times daily.   calcium  carbonate (OS-CAL) 1250 (500 Ca) MG chewable tablet Chew by mouth.   cariprazine  (VRAYLAR ) 1.5 MG capsule Take 1 capsule (1.5 mg total) by mouth daily.   Cholecalciferol 25 MCG (1000 UT) tablet Take 1 Units by mouth daily at 2 PM.   clobetasol  (TEMOVATE ) 0.05 % external solution Apply topically 2 (two) times daily as needed.   clonazePAM  (KLONOPIN ) 0.5 MG tablet Take 1 tablet (0.5 mg total) by mouth 2 (two) times daily as needed for anxiety.   dicyclomine (BENTYL) 10 MG capsule Take 10 mg by mouth as needed for spasms.   EPINEPHrine  0.3 mg/0.3 mL IJ SOAJ injection Inject 0.3 mg into the muscle as needed for anaphylaxis.   fexofenadine  (ALLEGRA ) 180 MG tablet TAKE 1 TABLET BY MOUTH EVERY DAY   hydrOXYzine  (VISTARIL ) 25 MG capsule Take 1-2 capsules (25-50 mg total) by mouth daily as needed.   levothyroxine  (SYNTHROID ) 25 MCG tablet Take 1 tablet (25 mcg total) by mouth daily before breakfast.   LINZESS 72 MCG capsule Take 72 mcg by mouth daily.   Loratadine  (CLARITIN  PO) Take by mouth.   montelukast  (SINGULAIR ) 10 MG tablet Take 1 tablet (10 mg total) by mouth daily.   Multiple Vitamin (MULTIVITAMIN) capsule Take by mouth.   norethindrone  (  MICRONOR ) 0.35 MG tablet TAKE 1 TABLET BY MOUTH EVERY DAY   Olopatadine  HCl 0.2 % SOLN Apply 1 drop to eye every morning.   ondansetron (ZOFRAN-ODT) 4 MG disintegrating tablet DISSOLVE ON TONGUE AT ONSET OF NAUSEA- 30 DAY SUPPLY (Patient taking differently: Take 4 mg by mouth as needed.)   pantoprazole  (PROTONIX ) 40 MG tablet Take 1 tablet by mouth in the morning and at bedtime.   Polyethylene Glycol 3350  (MIRALAX  PO) Take by mouth as needed.   Pseudoephedrine -Guaifenesin  [phone] MG TB12 Take 1 tablet by mouth 2 (two) times  daily. For congestion   RESTASIS 0.05 % ophthalmic emulsion 1 drop 2 (two) times daily.   RIZATRIPTAN BENZOATE PO Take by mouth.   sertraline  (ZOLOFT ) 50 MG tablet Take 1.5 tablets (75 mg total) by mouth daily.   topiramate (TOPAMAX) 50 MG tablet Take 100 mg by mouth 2 (two) times daily.   VENTOLIN  HFA 108 (90 Base) MCG/ACT inhaler INHALE 2 PUFFS BY MOUTH EVERY 4 HOURS AS NEEDED FOR WHEEZE OR FOR SHORTNESS OF BREATH   [DISCONTINUED] levothyroxine  (SYNTHROID ) 25 MCG tablet Take 1 tablet (25 mcg total) by mouth daily before breakfast.   [DISCONTINUED] tirzepatide  (ZEPBOUND ) 2.5 MG/0.5ML Pen Inject 2.5 mg into the skin once a week. (Patient not taking: Reported on 12/26/2024)   No facility-administered encounter medications on file as of 12/26/2024.   ALLERGIES: Allergies  Allergen Reactions   Brompheniramine Swelling and Anxiety   Glucosamine Forte [Nutritional Supplements] Anaphylaxis    Due to containing shellfish   Nutritional Supplements Anaphylaxis   Oyster Extract Anaphylaxis   Shellfish Protein-Containing Drug Products Anaphylaxis    Throat swelling   Cats Claw (Uncaria Tomentosa) Nausea And Vomiting   Diamox [Acetazolamide] Other (See Comments)    Numbness- feet, hands, lips   Lactose    Lactose Intolerance (Gi)    Topiramate Other (See Comments)    Numbness to face, hands and feet   Bromfed Dm [Pseudoeph-Bromphen-Dm] Anxiety    CARBOFED DM   Sulfamethizole Itching    VACCINATION STATUS: Immunization History  Administered Date(s) Administered   DTaP 09/24/2003, 12/10/2003, 02/16/2004, 11/01/2004, 07/23/2007, 07/09/2012   Dtap, Unspecified 11/01/2004, 07/23/2007   H1N1 10/07/2008, 11/12/2008   HIB (PRP-OMP) 09/24/2003, 12/10/2003, 02/16/2004, 11/01/2004, 07/23/2017   HIB (PRP-T) 09/24/2003, 12/10/2003, 02/16/2004   HIB, Unspecified 11/01/2004   HPV Quadrivalent 09/04/2013, 11/05/2013, 03/20/2014   Hep A, Unspecified 07/24/2006, 07/23/2007   Hep B, Unspecified  09/03/2003, 12/10/2003, 07/26/2004   IPV 09/24/2003, 12/10/2003, 11/01/2004, 07/23/2007   Influenza Nasal 08/10/2010, 09/03/2012, 09/04/2013, 09/14/2014   Influenza Split 09/05/2011, 09/05/2021   Influenza, Seasonal, Injecte, Preservative Fre 10/12/2005, 10/07/2008, 09/27/2009, 08/29/2023   Influenza,Quad,Nasal, Live 09/03/2012, 09/04/2013, 09/14/2014   Influenza,inj,Quad PF,6+ Mos 08/30/2015, 09/21/2016, 09/13/2017, 09/06/2018, 08/27/2019, 11/12/2020, 09/05/2021, 10/11/2022   Influenza,inj,quad, With Preservative 10/12/2018   Influenza-Unspecified 10/07/2008, 09/05/2011, 09/05/2011, 08/29/2023   MMR 07/26/2004, 07/23/2007   Meningococcal B, OMV 10/21/2019, 05/13/2020   Meningococcal Mcv4o 09/14/2014, 10/21/2019   Novel Infuenza-h1n1-09 11/12/2008   PFIZER Comirnaty(Gray Top)Covid-19 Tri-Sucrose Vaccine 04/01/2021, 05/06/2021   PPD Test 08/29/2023   Pfizer(Comirnaty)Fall Seasonal Vaccine 12 years and older 11/17/2022, 10/05/2023   Pneumococcal Conjugate PCV 7 09/24/2003, 12/10/2003, 02/16/2004, 11/01/2004   Pneumococcal Polysaccharide-23 07/09/2012, 03/06/2022   Pneumococcal-Unspecified 09/24/2003, 02/16/2004, 11/01/2004, 12/09/2006   Polio, Unspecified 11/01/2004, 07/23/2007   Tdap 09/14/2014   Varicella 07/26/2004, 07/23/2007    HPI Molly [name] is 25 y.o. female who presents today with a medical history as above. she is being seen in follow-up after she was  seen in consultation for hypothyroidism.    She is accompanied by her mother to clinic.  She was recently diagnosed with mild hypothyroidism for which she was initiated on levothyroxine  currently 50 mcg p.o. daily before breakfast.  Her previsit labs are consistent with slight over replacement.    She does not have new complaints today.  She was exposed to high-dose steroids in the interval for sinus infections.    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   Because of her history of  hyperhidrosis, she underwent several workup including 24-hour urine metanephrines measurements as well as adrenal CT scan which did not show any particular pathology.  Of note patient is on polypharmacy for multiple medical problems including mood disorders, bipolar disorders, anxiety disorders. Patient has been overweight/obese for most of her life including during her pediatric ages. She did not have any success in weight loss, she presents with 10+ pounds of weight gain since last visit.  She wishes to achieve some weight loss.  Her insurance did not provide coverage for GLP-1 receptor agonists.  She has prediabetes, PCOS, hyperlipidemia, hypertension, besides her mental illnesses.  Her recent point-of-care A1c today is 5.1% indicating reversal of her prediabetes. She is 24 years old with 30+ medications on her list. Her active medications include Strattera , azelastine -fluticasone , bisoprolol  fumarate, Symbicort , buspirone , cariprazine , Klonopin , Valium, Benadryl, Allegra , Vistaril , Singulair , Zoloft , Imitrex, Topamax.  There is significant family history of obesity, type 2 diabetes, hypertension, hyperlipidemia, asthma, thyroid  dysfunction, multiple allergy  conditions among others. She gives history of being suicidal in 2019, however the medications she is taking currently helped her regain her insight.  She is not suicidal anymore.  Patient is not following any particular diet or exercise program.  She has not engaged optimally to the lifestyle medicine we discussed last time.  She denies any exposure to heavy dose steroids, except in her bronchodilators.   Review of Systems  Constitutional: + Fluctuating Mydrilate, + fatigue, no subjective hyperthermia, no subjective hypothermia Eyes: no blurry vision, no xerophthalmia   Objective:       12/26/2024   10:57 AM 11/26/2024   10:46 AM 11/14/2024    5:06 PM  Vitals with BMI  Height 4' 11 4' 11 4' 11.449  Weight 239 lbs 230 lbs 233  lbs 13 oz  BMI 48.25 46.43 46.51  Systolic 104 127 877  Diastolic 62 84 88  Pulse 84 100 96    BP 104/62   Pulse 84   Ht 4' 11 (1.499 m)   Wt 239 lb (108.4 kg)   BMI 48.27 kg/m   Wt Readings from Last 3 Encounters:  12/26/24 239 lb (108.4 kg)  11/26/24 230 lb (104.3 kg)  11/14/24 233 lb 12.8 oz (106.1 kg)    Physical Exam  Constitutional:  Body mass index is 48.27 kg/m.,  not in acute distress, normal state of mind Eyes: PERRLA, EOMI, no exophthalmos ENT: moist mucous membranes, no gross thyromegaly, no gross cervical lymphadenopathy   CMP ( most recent) CMP     Component Value Date/Time   NA 145 (H) 08/15/2024 1056   K 4.3 08/15/2024 1056   CL 113 (H) 08/15/2024 1056   CO2 18 (L) 08/15/2024 1056   GLUCOSE 93 08/15/2024 1056   GLUCOSE 103 (H) 04/05/2018 1818   BUN 11 08/15/2024 1056   CREATININE 0.71 08/15/2024 1056   CALCIUM  9.1 08/15/2024 1056   PROT 6.1 08/15/2024 1056   ALBUMIN 4.0 08/15/2024 1056  AST 20 08/15/2024 1056   ALT 22 08/15/2024 1056   ALKPHOS 81 08/15/2024 1056   BILITOT 0.2 08/15/2024 1056   EGFR 124 08/15/2024 1056   GFRNONAA NOT CALCULATED 04/05/2018 1818     Diabetic Labs (most recent): Lab Results  Component Value Date   HGBA1C 5.1 (A) 08/19/2024   HGBA1C 5.0 05/13/2024   HGBA1C 5.1 10/11/2023   MICROALBUR 150 06/20/2023     Lipid Panel ( most recent) Lipid Panel     Component Value Date/Time   CHOL 198 08/15/2024 1056   TRIG 148 08/15/2024 1056   HDL 43 08/15/2024 1056   CHOLHDL 4.6 (H) 08/15/2024 1056   CHOLHDL 3.3 03/07/2018 0713   VLDL 20 03/07/2018 0713   LDLCALC 128 (H) 08/15/2024 1056   LABVLDL 27 08/15/2024 1056      Lab Results  Component Value Date   TSH <0.005 (L) 08/15/2024   TSH 1.340 05/13/2024   TSH 2.200 02/08/2024   TSH 3.730 10/05/2023   TSH 4.790 (H) 05/29/2023   FREET4 1.58 08/15/2024   FREET4 1.01 02/08/2024   FREET4 1.12 10/05/2023   FREET4 1.07 05/29/2023   FREET4 1.10 03/06/2022      Recent Results (from the past 2160 hours)  COVID-19, Flu A+B and RSV     Status: None   Collection Time: 09/30/24 10:42 AM   Specimen: Nasopharyngeal(NP) swabs in vial transport medium  Result Value Ref Range   SARS-CoV-2, NAA Not Detected Not Detected   Influenza A, NAA Not Detected Not Detected   Influenza B, NAA Not Detected Not Detected   RSV, NAA Not Detected Not Detected   Test Information: Comment     Comment: This nucleic acid amplification test was developed and its performance characteristics determined by [employer]. Nucleic acid amplification tests include RT-PCR and TMA. This test has not been FDA cleared or approved. This test has been authorized by FDA under an Emergency Use Authorization (EUA). This test is only authorized for the duration of time the declaration that circumstances exist justifying the authorization of the emergency use of in vitro diagnostic tests for detection of SARS-CoV-2 virus and/or diagnosis of COVID-19 infection under section 564(b)(1) of the Act, 21 U.S.C. 639aaa-6(a) (1), unless the authorization is terminated or revoked sooner. When diagnostic testing is negative, the possibility of a false negative result should be considered in the context of a patient's recent exposures and the presence of clinical signs and symptoms consistent with COVID-19. An individual without symptoms of COVID-19 and who is not shedding SARS-CoV-2 virus wo uld expect to have a negative (not detected) result in this assay.   Urinalysis, Routine w reflex microscopic     Status: Abnormal   Collection Time: 11/07/24 10:58 AM  Result Value Ref Range   Specific Gravity, UA 1.025 1.005 - 1.030   pH, UA 6.0 5.0 - 7.5   Color, UA Yellow Yellow   Appearance Ur Clear Clear   Leukocytes,UA 1+ (A) Negative   Protein,UA Trace (A) Negative/Trace   Glucose, UA Negative Negative   Ketones, UA Negative Negative   RBC, UA Trace (A) Negative   Bilirubin, UA  Negative Negative   Urobilinogen, Ur 0.2 0.2 - 1.0 mg/dL   Nitrite, UA Negative Negative   Microscopic Examination See below:   Microscopic Examination     Status: Abnormal   Collection Time: 11/07/24 10:58 AM   Urine  Result Value Ref Range   WBC, UA 11-30 (A) 0 - 5 /hpf  RBC, Urine None seen 0 - 2 /hpf   Epithelial Cells (non renal) 0-10 0 - 10 /hpf   Renal Epithel, UA None seen None seen /hpf   Mucus, UA Present (A) Not Estab.   Bacteria, UA Many (A) None seen/Few   Yeast, UA Present (A) None seen  Urine Culture     Status: None   Collection Time: 11/07/24  1:53 PM   Specimen: Urine   UR  Result Value Ref Range   Urine Culture, Routine Final report    Organism ID, Bacteria Comment     Comment: Mixed urogenital flora 50,000-100,000 colony forming units per mL   Veritor SARS-CoV-2 and Flu A+B     Status: None   Collection Time: 11/26/24 10:48 AM   Specimen: Nasopharyngeal  Result Value Ref Range   Influenza A Negative Negative   Influenza B Negative Negative    Comment: If the test is negative for the presence of influenza A or influenza B antigen, infection due to influenza cannot be ruled-out because the antigen present in the sample may be below the detection limit of the test. It is recommended that these results be confirmed by viral culture or an FDA-cleared influenza A and B molecular assay.    BD Veritor SARS-CoV-2 Ag Negative Negative    Comment:              Testing was performed using BD Veritor(TM) SARS-CoV-2 This test has not been FDA cleared or approved. This test has been authorized by FDA under an Emergency Use Authorization (EUA). This test is only authorized for the duration of the declaration that circumstances exist justifying the authorization of emergency use of in vitro diagnostics for detection and/or diagnosis of COVID-19 under Section 564(b)(1) of the Act, 21 U.S.C. 639aaa-6(a)(8), unless the authorization is terminated or revoked  sooner. When diagnostic testing is negative, the possibility of a false negative result should be considered in the context of a patient's recent exposure and the presence of clinical signs and symptoms consistent with COVID-19. An individual without symptoms of COVID-19 and who is not shedding SARS-CoV-2 virus would expect to have a negative (not detected) result in this assay.   RSV Ag, Immunochr, Waived     Status: None   Collection Time: 11/26/24 10:48 AM   Specimen: Nasopharyngeal   Naso  Result Value Ref Range   RSV Ag, Immunochr, Waived Negative Negative  Alpha-1-antitrypsin     Status: None   Collection Time: 12/19/24 10:45 AM  Result Value Ref Range   A-1 Antitrypsin 139 100 - 188 mg/dL  ANCA TITERS     Status: None   Collection Time: 12/19/24 10:45 AM  Result Value Ref Range   C-ANCA <1:20 Neg:<1:20 titer   P-ANCA <1:20 Neg:<1:20 titer    Comment: The presence of positive fluorescence exhibiting P-ANCA or C-ANCA patterns alone is not specific for the diagnosis of Wegener's Granulomatosis (WG) or microscopic polyangiitis. Decisions about treatment should not be based solely on ANCA IFA results.  The International ANCA Group Consensus recommends follow up testing of positive sera with both PR-3 and MPO-ANCA enzyme immunoassays. As many as 5% serum samples are positive only by EIA. Ref. AM J Clin Pathol 1999;111:507-513.    Atypical pANCA <1:20 Neg:<1:20 titer    Comment: The atypical pANCA pattern has been observed in a significant percentage of patients with ulcerative colitis, primary sclerosing cholangitis and autoimmune hepatitis.   Aspergillus Precipitins     Status: None   Collection  Time: 12/19/24 10:45 AM  Result Value Ref Range   A.Fumigatus #1 Abs Negative Negative   Aspergillus Flavus Antibodies Negative Negative   Aspergillus Niger Antibodies Negative Negative   Aspergillus nidulans IgG Negative Negative   Aspergillus glaucus IgG Negative Negative    Aspergillus terreus IgG Negative Negative  IgE     Status: None   Collection Time: 12/19/24 10:45 AM  Result Value Ref Range   IgE (Immunoglobulin E), Serum 120 6 - 495 IU/mL   Fibrosis 4 Score = .31 Score is based on outdated labs. ALT, AST, and platelets should all be measured within the last 6 months for an accurate FIB-4 Score  Fib-4 interpretation is not validated for people under 35 or over 65 years of age. However, scores under 2.0 are generally considered low risk.   Assessment & Plan:  1.  Hypothyroidism 2. Prediabetes 3.  Morbid obesity 4.  Hypertension 5.  Hyperlipidemia   - She is advised to continue levothyroxine  25 mcg p.o. daily before breakfast.     - We discussed about the correct intake of her thyroid  hormone, on empty stomach at fasting, with water, separated by at least 30 minutes from breakfast and other medications,  and separated by more than 4 hours from calcium , iron, multivitamins, acid reflux medications (PPIs). -Patient is made aware of the fact that thyroid  hormone replacement is needed for life, dose to be adjusted by periodic monitoring of thyroid  function tests.   -She was previously worked up and ruled out for pheochromocytoma.   Previsit free plasma metanephrines are normal.    -Her workup rules out Cushing's syndrome. She is vitamin D  replete. -She will be put on expectant management.  She has at least 8 medications with known side effects of hyperhidrosis including Strattera , azelastine -fluticasone , buspirone , cariprazine , Klonopin , Valium, Zoloft , Imitrex. -It seems like she is benefiting from this medications.  Unfortunately patient is on serious polypharmacy with 30+ medications at age 25.  Her medication list deserves to be revised and revisited by respective prescribers.  I advised her to address these concerns  during her next visits with her psychiatrist as well as primary care provider.  In light of her metabolic dysfunction with morbid  obesity, PCOS, hyperlipidemia, sleep apnea , hypertension, prediabetes, mental illness, this patient remains to be a good candidate for lifestyle medicine.   She has not engaged in lifestyle medicine nutrition optimally.  Her insurance did not provide coverage for GLP-1 receptor agonist.   In light of her PCOS, hyperlipidemia, hypertension, morbid obesity, she is still a good candidate for lifestyle medicine nutrition.  - she acknowledges that there is a room for improvement in her food and drink choices. - Suggestion is made for her to avoid simple carbohydrates  from her diet including Cakes, Sweet Desserts, Ice Cream, Soda (diet and regular), Sweet Tea, Candies, Chips, Cookies, Store Bought Juices, Alcohol  , Artificial Sweeteners,  Coffee Creamer, and Sugar-free Products, Lemonade. This will help patient to have more stable blood glucose profile and potentially avoid unintended weight gain.   - she is advised to maintain close follow up with R[facility], Rock HERO, FNP for primary care needs.   I spent  25 minutes in the care of the patient today including review of labs from Thyroid  Function, CMP, and other relevant labs ; imaging/biopsy records (current and previous including abstractions from other facilities); face-to-face time discussing  her lab results and symptoms, medications doses, her options of short and long term treatment based on the  latest standards of care / guidelines;   and documenting the encounter.  Molly [name]  participated in the discussions, expressed understanding, and voiced agreement with the above plans.  All questions were answered to her satisfaction. she is encouraged to contact clinic should she have any questions or concerns prior to her return visit. Dear Patient: Feel free to review your progress notes.  If you are reviewing this progress note and have questions about the meaning of /or medical terms being used, please make a note and address it at your next  follow-up appointment.  Medical notes are meant to be a communication tool between medical professionals and require medical terms to be used for efficiency and insurance approval.    Follow up plan: Return in about 4 months (around 04/25/2025) for Fasting Labs  in AM B4 8, A1c -NV.   Ranny Earl, MD [facility] [address] [place], [place] [address] Phone: [phone]  Fax: [phone]     12/26/2024, 1:24 PM  This note was partially dictated with voice recognition software. Similar sounding words can be transcribed inadequately or may not  be corrected upon review.  "

## 2024-12-26 NOTE — Patient Instructions (Signed)

## 2024-12-29 ENCOUNTER — Encounter: Payer: Self-pay | Admitting: Family Medicine

## 2024-12-30 ENCOUNTER — Telehealth: Payer: Self-pay

## 2024-12-30 ENCOUNTER — Other Ambulatory Visit (HOSPITAL_COMMUNITY): Payer: Self-pay

## 2024-12-30 NOTE — Telephone Encounter (Signed)
 Pharmacy Patient Advocate Encounter   Received notification from Physician's Office that prior authorization for Fexofenadine  HCl 180MG  tablets  is required/requested.   Insurance verification completed.   The patient is insured through [employer].   Per test claim: PA required; PA submitted to above mentioned insurance via Latent Key/confirmation #/EOC [id number] Status is pending

## 2024-12-31 ENCOUNTER — Other Ambulatory Visit (HOSPITAL_COMMUNITY): Payer: Self-pay

## 2024-12-31 NOTE — Telephone Encounter (Signed)
 Pharmacy Patient Advocate Encounter  Received notification from [facility] that Prior Authorization for Fexofenadine  HCl 180MG  tablets  has been APPROVED from 12/29/2024 to 12/30/2025. Ran test claim, Copay is $4. This test claim was processed through [facility]- copay amounts may vary at other pharmacies due to pharmacy/plan contracts, or as the patient moves through the different stages of their insurance plan.  Insurance will only pay for 30 day supply.  PA #/Case ID/Reference #: [id number]

## 2024-12-31 NOTE — Telephone Encounter (Signed)
 Notified via MyChart. LS

## 2025-01-05 ENCOUNTER — Other Ambulatory Visit (HOSPITAL_COMMUNITY): Payer: Self-pay

## 2025-01-06 ENCOUNTER — Encounter: Payer: Self-pay | Admitting: Allergy & Immunology

## 2025-01-06 MED ORDER — TRIAMCINOLONE ACETONIDE 0.1 % MT PSTE: 1.0000 | PASTE | Freq: Two times a day (BID) | OROMUCOSAL | 3 refills | Status: AC

## 2025-01-09 ENCOUNTER — Ambulatory Visit: Payer: MEDICAID | Admitting: Physician Assistant

## 2025-01-12 ENCOUNTER — Ambulatory Visit: Payer: MEDICAID | Admitting: Family Medicine
